# Patient Record
Sex: Female | Born: 1940
Health system: Southern US, Community
[De-identification: ages and names within clinical notes are randomized; demographics above are authoritative.]

## PROBLEM LIST (undated history)

## (undated) DIAGNOSIS — R519 Headache, unspecified: Secondary | ICD-10-CM

## (undated) DIAGNOSIS — Z889 Allergy status to unspecified drugs, medicaments and biological substances status: Secondary | ICD-10-CM

## (undated) DIAGNOSIS — T8859XA Other complications of anesthesia, initial encounter: Secondary | ICD-10-CM

## (undated) DIAGNOSIS — C801 Malignant (primary) neoplasm, unspecified: Secondary | ICD-10-CM

## (undated) DIAGNOSIS — R51 Headache: Secondary | ICD-10-CM

## (undated) DIAGNOSIS — T4145XA Adverse effect of unspecified anesthetic, initial encounter: Secondary | ICD-10-CM

## (undated) DIAGNOSIS — I639 Cerebral infarction, unspecified: Secondary | ICD-10-CM

## (undated) DIAGNOSIS — Z9889 Other specified postprocedural states: Secondary | ICD-10-CM

## (undated) DIAGNOSIS — M1990 Unspecified osteoarthritis, unspecified site: Secondary | ICD-10-CM

## (undated) DIAGNOSIS — K219 Gastro-esophageal reflux disease without esophagitis: Secondary | ICD-10-CM

## (undated) DIAGNOSIS — R112 Nausea with vomiting, unspecified: Secondary | ICD-10-CM

## (undated) DIAGNOSIS — I10 Essential (primary) hypertension: Secondary | ICD-10-CM

## (undated) HISTORY — PX: ABDOMINAL HYSTERECTOMY: SHX81

## (undated) HISTORY — PX: BREAST SURGERY: SHX581

## (undated) HISTORY — PX: TUBAL LIGATION: SHX77

## (undated) HISTORY — PX: CARPOMETACARPAL (CMC) FUSION OF THUMB: SHX6290

## (undated) HISTORY — PX: ABLATION: SHX5711

## (undated) HISTORY — PX: BACK SURGERY: SHX140

## (undated) HISTORY — PX: APPENDECTOMY: SHX54

## (undated) HISTORY — PX: HAND SURGERY: SHX662

---

## 2005-01-26 ENCOUNTER — Encounter: Admission: RE | Admit: 2005-01-26 | Discharge: 2005-01-26 | Payer: Self-pay | Admitting: Family Medicine

## 2005-04-20 ENCOUNTER — Encounter: Admission: RE | Admit: 2005-04-20 | Discharge: 2005-04-20 | Payer: Self-pay | Admitting: Family Medicine

## 2011-10-11 DIAGNOSIS — Z1231 Encounter for screening mammogram for malignant neoplasm of breast: Secondary | ICD-10-CM | POA: Diagnosis not present

## 2011-10-14 DIAGNOSIS — J069 Acute upper respiratory infection, unspecified: Secondary | ICD-10-CM | POA: Diagnosis not present

## 2011-11-14 DIAGNOSIS — M19049 Primary osteoarthritis, unspecified hand: Secondary | ICD-10-CM | POA: Diagnosis not present

## 2011-12-06 DIAGNOSIS — Z0000 Encounter for general adult medical examination without abnormal findings: Secondary | ICD-10-CM | POA: Diagnosis not present

## 2011-12-06 DIAGNOSIS — E785 Hyperlipidemia, unspecified: Secondary | ICD-10-CM | POA: Diagnosis not present

## 2011-12-06 DIAGNOSIS — E559 Vitamin D deficiency, unspecified: Secondary | ICD-10-CM | POA: Diagnosis not present

## 2011-12-06 DIAGNOSIS — Z1331 Encounter for screening for depression: Secondary | ICD-10-CM | POA: Diagnosis not present

## 2011-12-06 DIAGNOSIS — I10 Essential (primary) hypertension: Secondary | ICD-10-CM | POA: Diagnosis not present

## 2011-12-06 DIAGNOSIS — K219 Gastro-esophageal reflux disease without esophagitis: Secondary | ICD-10-CM | POA: Diagnosis not present

## 2011-12-18 DIAGNOSIS — J069 Acute upper respiratory infection, unspecified: Secondary | ICD-10-CM | POA: Diagnosis not present

## 2011-12-31 DIAGNOSIS — L570 Actinic keratosis: Secondary | ICD-10-CM | POA: Diagnosis not present

## 2011-12-31 DIAGNOSIS — D239 Other benign neoplasm of skin, unspecified: Secondary | ICD-10-CM | POA: Diagnosis not present

## 2011-12-31 DIAGNOSIS — Z85828 Personal history of other malignant neoplasm of skin: Secondary | ICD-10-CM | POA: Diagnosis not present

## 2011-12-31 DIAGNOSIS — D485 Neoplasm of uncertain behavior of skin: Secondary | ICD-10-CM | POA: Diagnosis not present

## 2011-12-31 DIAGNOSIS — L821 Other seborrheic keratosis: Secondary | ICD-10-CM | POA: Diagnosis not present

## 2012-02-14 DIAGNOSIS — M1710 Unilateral primary osteoarthritis, unspecified knee: Secondary | ICD-10-CM | POA: Diagnosis not present

## 2012-02-20 DIAGNOSIS — M1710 Unilateral primary osteoarthritis, unspecified knee: Secondary | ICD-10-CM | POA: Diagnosis not present

## 2012-02-27 DIAGNOSIS — M1710 Unilateral primary osteoarthritis, unspecified knee: Secondary | ICD-10-CM | POA: Diagnosis not present

## 2012-03-03 DIAGNOSIS — H0019 Chalazion unspecified eye, unspecified eyelid: Secondary | ICD-10-CM | POA: Diagnosis not present

## 2012-03-05 DIAGNOSIS — M1710 Unilateral primary osteoarthritis, unspecified knee: Secondary | ICD-10-CM | POA: Diagnosis not present

## 2012-03-13 DIAGNOSIS — M1710 Unilateral primary osteoarthritis, unspecified knee: Secondary | ICD-10-CM | POA: Diagnosis not present

## 2012-03-17 DIAGNOSIS — M19049 Primary osteoarthritis, unspecified hand: Secondary | ICD-10-CM | POA: Diagnosis not present

## 2012-03-24 DIAGNOSIS — M1710 Unilateral primary osteoarthritis, unspecified knee: Secondary | ICD-10-CM | POA: Diagnosis not present

## 2012-05-08 DIAGNOSIS — M1710 Unilateral primary osteoarthritis, unspecified knee: Secondary | ICD-10-CM | POA: Diagnosis not present

## 2012-05-26 DIAGNOSIS — R42 Dizziness and giddiness: Secondary | ICD-10-CM | POA: Diagnosis not present

## 2012-05-26 DIAGNOSIS — Z23 Encounter for immunization: Secondary | ICD-10-CM | POA: Diagnosis not present

## 2012-05-27 DIAGNOSIS — H109 Unspecified conjunctivitis: Secondary | ICD-10-CM | POA: Diagnosis not present

## 2012-06-12 DIAGNOSIS — N3000 Acute cystitis without hematuria: Secondary | ICD-10-CM | POA: Diagnosis not present

## 2012-06-12 DIAGNOSIS — N390 Urinary tract infection, site not specified: Secondary | ICD-10-CM | POA: Diagnosis not present

## 2012-06-12 DIAGNOSIS — R300 Dysuria: Secondary | ICD-10-CM | POA: Diagnosis not present

## 2012-06-13 DIAGNOSIS — R229 Localized swelling, mass and lump, unspecified: Secondary | ICD-10-CM | POA: Diagnosis not present

## 2012-09-19 DIAGNOSIS — H6120 Impacted cerumen, unspecified ear: Secondary | ICD-10-CM | POA: Diagnosis not present

## 2012-10-01 DIAGNOSIS — M1710 Unilateral primary osteoarthritis, unspecified knee: Secondary | ICD-10-CM | POA: Diagnosis not present

## 2012-10-08 DIAGNOSIS — M1710 Unilateral primary osteoarthritis, unspecified knee: Secondary | ICD-10-CM | POA: Diagnosis not present

## 2012-10-14 DIAGNOSIS — Z1231 Encounter for screening mammogram for malignant neoplasm of breast: Secondary | ICD-10-CM | POA: Diagnosis not present

## 2012-10-15 DIAGNOSIS — M1710 Unilateral primary osteoarthritis, unspecified knee: Secondary | ICD-10-CM | POA: Diagnosis not present

## 2012-10-22 DIAGNOSIS — M1710 Unilateral primary osteoarthritis, unspecified knee: Secondary | ICD-10-CM | POA: Diagnosis not present

## 2012-10-31 DIAGNOSIS — M1710 Unilateral primary osteoarthritis, unspecified knee: Secondary | ICD-10-CM | POA: Diagnosis not present

## 2012-12-12 DIAGNOSIS — E559 Vitamin D deficiency, unspecified: Secondary | ICD-10-CM | POA: Diagnosis not present

## 2012-12-12 DIAGNOSIS — Z0000 Encounter for general adult medical examination without abnormal findings: Secondary | ICD-10-CM | POA: Diagnosis not present

## 2012-12-12 DIAGNOSIS — E538 Deficiency of other specified B group vitamins: Secondary | ICD-10-CM | POA: Diagnosis not present

## 2012-12-12 DIAGNOSIS — E785 Hyperlipidemia, unspecified: Secondary | ICD-10-CM | POA: Diagnosis not present

## 2012-12-12 DIAGNOSIS — K219 Gastro-esophageal reflux disease without esophagitis: Secondary | ICD-10-CM | POA: Diagnosis not present

## 2012-12-12 DIAGNOSIS — I10 Essential (primary) hypertension: Secondary | ICD-10-CM | POA: Diagnosis not present

## 2012-12-12 DIAGNOSIS — R42 Dizziness and giddiness: Secondary | ICD-10-CM | POA: Diagnosis not present

## 2012-12-18 DIAGNOSIS — R42 Dizziness and giddiness: Secondary | ICD-10-CM | POA: Diagnosis not present

## 2012-12-18 DIAGNOSIS — H8110 Benign paroxysmal vertigo, unspecified ear: Secondary | ICD-10-CM | POA: Diagnosis not present

## 2012-12-18 DIAGNOSIS — H6120 Impacted cerumen, unspecified ear: Secondary | ICD-10-CM | POA: Diagnosis not present

## 2012-12-23 ENCOUNTER — Other Ambulatory Visit: Payer: Self-pay | Admitting: Family Medicine

## 2012-12-23 DIAGNOSIS — R42 Dizziness and giddiness: Secondary | ICD-10-CM

## 2012-12-31 ENCOUNTER — Ambulatory Visit
Admission: RE | Admit: 2012-12-31 | Discharge: 2012-12-31 | Disposition: A | Discharge status: Home / Self Care | Payer: Medicare Other | Source: Ambulatory Visit | Attending: Family Medicine | Admitting: Family Medicine

## 2012-12-31 DIAGNOSIS — I658 Occlusion and stenosis of other precerebral arteries: Secondary | ICD-10-CM | POA: Diagnosis not present

## 2012-12-31 DIAGNOSIS — R42 Dizziness and giddiness: Secondary | ICD-10-CM

## 2013-01-06 DIAGNOSIS — I10 Essential (primary) hypertension: Secondary | ICD-10-CM | POA: Diagnosis not present

## 2013-01-06 DIAGNOSIS — I951 Orthostatic hypotension: Secondary | ICD-10-CM | POA: Diagnosis not present

## 2013-01-16 DIAGNOSIS — R42 Dizziness and giddiness: Secondary | ICD-10-CM | POA: Diagnosis not present

## 2013-01-16 DIAGNOSIS — I10 Essential (primary) hypertension: Secondary | ICD-10-CM | POA: Diagnosis not present

## 2013-01-19 ENCOUNTER — Other Ambulatory Visit: Payer: Self-pay | Admitting: Family Medicine

## 2013-01-19 DIAGNOSIS — R42 Dizziness and giddiness: Secondary | ICD-10-CM

## 2013-01-21 ENCOUNTER — Ambulatory Visit
Admission: RE | Admit: 2013-01-21 | Discharge: 2013-01-21 | Disposition: A | Discharge status: Home / Self Care | Payer: Medicare Other | Source: Ambulatory Visit | Attending: Family Medicine | Admitting: Family Medicine

## 2013-01-21 DIAGNOSIS — R42 Dizziness and giddiness: Secondary | ICD-10-CM | POA: Diagnosis not present

## 2013-02-04 ENCOUNTER — Ambulatory Visit: Payer: Medicare Other | Attending: Family Medicine | Admitting: *Deleted

## 2013-02-04 DIAGNOSIS — IMO0001 Reserved for inherently not codable concepts without codable children: Secondary | ICD-10-CM | POA: Diagnosis not present

## 2013-02-04 DIAGNOSIS — H8110 Benign paroxysmal vertigo, unspecified ear: Secondary | ICD-10-CM | POA: Insufficient documentation

## 2013-02-06 ENCOUNTER — Ambulatory Visit: Payer: Medicare Other | Admitting: *Deleted

## 2013-02-06 DIAGNOSIS — H8110 Benign paroxysmal vertigo, unspecified ear: Secondary | ICD-10-CM | POA: Diagnosis not present

## 2013-02-06 DIAGNOSIS — IMO0001 Reserved for inherently not codable concepts without codable children: Secondary | ICD-10-CM | POA: Diagnosis not present

## 2013-02-18 DIAGNOSIS — H1044 Vernal conjunctivitis: Secondary | ICD-10-CM | POA: Diagnosis not present

## 2013-04-13 DIAGNOSIS — Z85828 Personal history of other malignant neoplasm of skin: Secondary | ICD-10-CM | POA: Diagnosis not present

## 2013-04-13 DIAGNOSIS — D485 Neoplasm of uncertain behavior of skin: Secondary | ICD-10-CM | POA: Diagnosis not present

## 2013-04-13 DIAGNOSIS — Z411 Encounter for cosmetic surgery: Secondary | ICD-10-CM | POA: Diagnosis not present

## 2013-04-13 DIAGNOSIS — L821 Other seborrheic keratosis: Secondary | ICD-10-CM | POA: Diagnosis not present

## 2013-04-13 DIAGNOSIS — L570 Actinic keratosis: Secondary | ICD-10-CM | POA: Diagnosis not present

## 2013-04-13 DIAGNOSIS — D239 Other benign neoplasm of skin, unspecified: Secondary | ICD-10-CM | POA: Diagnosis not present

## 2013-05-06 DIAGNOSIS — M1710 Unilateral primary osteoarthritis, unspecified knee: Secondary | ICD-10-CM | POA: Diagnosis not present

## 2013-05-13 DIAGNOSIS — M1710 Unilateral primary osteoarthritis, unspecified knee: Secondary | ICD-10-CM | POA: Diagnosis not present

## 2013-05-22 DIAGNOSIS — Z23 Encounter for immunization: Secondary | ICD-10-CM | POA: Diagnosis not present

## 2013-05-22 DIAGNOSIS — M1710 Unilateral primary osteoarthritis, unspecified knee: Secondary | ICD-10-CM | POA: Diagnosis not present

## 2013-05-29 DIAGNOSIS — M1710 Unilateral primary osteoarthritis, unspecified knee: Secondary | ICD-10-CM | POA: Diagnosis not present

## 2013-06-03 DIAGNOSIS — J40 Bronchitis, not specified as acute or chronic: Secondary | ICD-10-CM | POA: Diagnosis not present

## 2013-06-05 DIAGNOSIS — H0019 Chalazion unspecified eye, unspecified eyelid: Secondary | ICD-10-CM | POA: Diagnosis not present

## 2013-06-05 DIAGNOSIS — M1710 Unilateral primary osteoarthritis, unspecified knee: Secondary | ICD-10-CM | POA: Diagnosis not present

## 2013-06-17 DIAGNOSIS — M1990 Unspecified osteoarthritis, unspecified site: Secondary | ICD-10-CM | POA: Diagnosis not present

## 2013-07-01 DIAGNOSIS — M1990 Unspecified osteoarthritis, unspecified site: Secondary | ICD-10-CM | POA: Diagnosis not present

## 2013-09-14 DIAGNOSIS — M1990 Unspecified osteoarthritis, unspecified site: Secondary | ICD-10-CM | POA: Diagnosis not present

## 2013-10-13 DIAGNOSIS — I10 Essential (primary) hypertension: Secondary | ICD-10-CM | POA: Diagnosis not present

## 2013-10-15 DIAGNOSIS — Z1231 Encounter for screening mammogram for malignant neoplasm of breast: Secondary | ICD-10-CM | POA: Diagnosis not present

## 2013-10-20 DIAGNOSIS — I10 Essential (primary) hypertension: Secondary | ICD-10-CM | POA: Diagnosis not present

## 2013-11-11 DIAGNOSIS — J069 Acute upper respiratory infection, unspecified: Secondary | ICD-10-CM | POA: Diagnosis not present

## 2013-11-11 DIAGNOSIS — I10 Essential (primary) hypertension: Secondary | ICD-10-CM | POA: Diagnosis not present

## 2013-11-16 DIAGNOSIS — L570 Actinic keratosis: Secondary | ICD-10-CM | POA: Diagnosis not present

## 2013-12-09 DIAGNOSIS — M1710 Unilateral primary osteoarthritis, unspecified knee: Secondary | ICD-10-CM | POA: Diagnosis not present

## 2013-12-09 DIAGNOSIS — IMO0002 Reserved for concepts with insufficient information to code with codable children: Secondary | ICD-10-CM | POA: Diagnosis not present

## 2013-12-16 DIAGNOSIS — M1710 Unilateral primary osteoarthritis, unspecified knee: Secondary | ICD-10-CM | POA: Diagnosis not present

## 2013-12-17 DIAGNOSIS — K219 Gastro-esophageal reflux disease without esophagitis: Secondary | ICD-10-CM | POA: Diagnosis not present

## 2013-12-17 DIAGNOSIS — Z23 Encounter for immunization: Secondary | ICD-10-CM | POA: Diagnosis not present

## 2013-12-17 DIAGNOSIS — Z0000 Encounter for general adult medical examination without abnormal findings: Secondary | ICD-10-CM | POA: Diagnosis not present

## 2013-12-17 DIAGNOSIS — E7800 Pure hypercholesterolemia, unspecified: Secondary | ICD-10-CM | POA: Diagnosis not present

## 2013-12-17 DIAGNOSIS — E538 Deficiency of other specified B group vitamins: Secondary | ICD-10-CM | POA: Diagnosis not present

## 2013-12-17 DIAGNOSIS — I10 Essential (primary) hypertension: Secondary | ICD-10-CM | POA: Diagnosis not present

## 2013-12-24 DIAGNOSIS — M1710 Unilateral primary osteoarthritis, unspecified knee: Secondary | ICD-10-CM | POA: Diagnosis not present

## 2014-01-01 DIAGNOSIS — M1710 Unilateral primary osteoarthritis, unspecified knee: Secondary | ICD-10-CM | POA: Diagnosis not present

## 2014-01-08 DIAGNOSIS — M1710 Unilateral primary osteoarthritis, unspecified knee: Secondary | ICD-10-CM | POA: Diagnosis not present

## 2014-02-03 DIAGNOSIS — S91109A Unspecified open wound of unspecified toe(s) without damage to nail, initial encounter: Secondary | ICD-10-CM | POA: Diagnosis not present

## 2014-02-16 DIAGNOSIS — M76899 Other specified enthesopathies of unspecified lower limb, excluding foot: Secondary | ICD-10-CM | POA: Diagnosis not present

## 2014-02-24 DIAGNOSIS — D485 Neoplasm of uncertain behavior of skin: Secondary | ICD-10-CM | POA: Diagnosis not present

## 2014-02-24 DIAGNOSIS — L570 Actinic keratosis: Secondary | ICD-10-CM | POA: Diagnosis not present

## 2014-04-19 DIAGNOSIS — L821 Other seborrheic keratosis: Secondary | ICD-10-CM | POA: Diagnosis not present

## 2014-04-19 DIAGNOSIS — D239 Other benign neoplasm of skin, unspecified: Secondary | ICD-10-CM | POA: Diagnosis not present

## 2014-04-19 DIAGNOSIS — Z85828 Personal history of other malignant neoplasm of skin: Secondary | ICD-10-CM | POA: Diagnosis not present

## 2014-04-19 DIAGNOSIS — L570 Actinic keratosis: Secondary | ICD-10-CM | POA: Diagnosis not present

## 2014-06-07 DIAGNOSIS — H2510 Age-related nuclear cataract, unspecified eye: Secondary | ICD-10-CM | POA: Diagnosis not present

## 2014-06-07 DIAGNOSIS — Z23 Encounter for immunization: Secondary | ICD-10-CM | POA: Diagnosis not present

## 2014-06-17 DIAGNOSIS — M1990 Unspecified osteoarthritis, unspecified site: Secondary | ICD-10-CM | POA: Diagnosis not present

## 2014-06-17 DIAGNOSIS — M25511 Pain in right shoulder: Secondary | ICD-10-CM | POA: Diagnosis not present

## 2014-06-17 DIAGNOSIS — M7541 Impingement syndrome of right shoulder: Secondary | ICD-10-CM | POA: Diagnosis not present

## 2014-06-22 DIAGNOSIS — K219 Gastro-esophageal reflux disease without esophagitis: Secondary | ICD-10-CM | POA: Diagnosis not present

## 2014-06-22 DIAGNOSIS — I10 Essential (primary) hypertension: Secondary | ICD-10-CM | POA: Diagnosis not present

## 2014-06-22 DIAGNOSIS — E785 Hyperlipidemia, unspecified: Secondary | ICD-10-CM | POA: Diagnosis not present

## 2014-06-22 DIAGNOSIS — E538 Deficiency of other specified B group vitamins: Secondary | ICD-10-CM | POA: Diagnosis not present

## 2014-06-25 DIAGNOSIS — B0089 Other herpesviral infection: Secondary | ICD-10-CM | POA: Diagnosis not present

## 2014-07-22 DIAGNOSIS — L570 Actinic keratosis: Secondary | ICD-10-CM | POA: Diagnosis not present

## 2014-07-22 DIAGNOSIS — L821 Other seborrheic keratosis: Secondary | ICD-10-CM | POA: Diagnosis not present

## 2014-08-04 DIAGNOSIS — M1711 Unilateral primary osteoarthritis, right knee: Secondary | ICD-10-CM | POA: Diagnosis not present

## 2014-08-04 DIAGNOSIS — M170 Bilateral primary osteoarthritis of knee: Secondary | ICD-10-CM | POA: Diagnosis not present

## 2014-08-04 DIAGNOSIS — M1712 Unilateral primary osteoarthritis, left knee: Secondary | ICD-10-CM | POA: Diagnosis not present

## 2014-08-11 DIAGNOSIS — M1712 Unilateral primary osteoarthritis, left knee: Secondary | ICD-10-CM | POA: Diagnosis not present

## 2014-08-11 DIAGNOSIS — M1711 Unilateral primary osteoarthritis, right knee: Secondary | ICD-10-CM | POA: Diagnosis not present

## 2014-08-20 DIAGNOSIS — M1711 Unilateral primary osteoarthritis, right knee: Secondary | ICD-10-CM | POA: Diagnosis not present

## 2014-08-20 DIAGNOSIS — M170 Bilateral primary osteoarthritis of knee: Secondary | ICD-10-CM | POA: Diagnosis not present

## 2014-08-20 DIAGNOSIS — M1712 Unilateral primary osteoarthritis, left knee: Secondary | ICD-10-CM | POA: Diagnosis not present

## 2014-08-27 DIAGNOSIS — M170 Bilateral primary osteoarthritis of knee: Secondary | ICD-10-CM | POA: Diagnosis not present

## 2014-08-27 DIAGNOSIS — M1711 Unilateral primary osteoarthritis, right knee: Secondary | ICD-10-CM | POA: Diagnosis not present

## 2014-08-27 DIAGNOSIS — M1712 Unilateral primary osteoarthritis, left knee: Secondary | ICD-10-CM | POA: Diagnosis not present

## 2014-09-08 DIAGNOSIS — M1712 Unilateral primary osteoarthritis, left knee: Secondary | ICD-10-CM | POA: Diagnosis not present

## 2014-09-08 DIAGNOSIS — M1711 Unilateral primary osteoarthritis, right knee: Secondary | ICD-10-CM | POA: Diagnosis not present

## 2014-09-08 DIAGNOSIS — M170 Bilateral primary osteoarthritis of knee: Secondary | ICD-10-CM | POA: Diagnosis not present

## 2014-10-22 DIAGNOSIS — Z1231 Encounter for screening mammogram for malignant neoplasm of breast: Secondary | ICD-10-CM | POA: Diagnosis not present

## 2014-12-23 DIAGNOSIS — E780 Pure hypercholesterolemia: Secondary | ICD-10-CM | POA: Diagnosis not present

## 2014-12-23 DIAGNOSIS — D81818 Other biotin-dependent carboxylase deficiency: Secondary | ICD-10-CM | POA: Diagnosis not present

## 2014-12-23 DIAGNOSIS — Z0000 Encounter for general adult medical examination without abnormal findings: Secondary | ICD-10-CM | POA: Diagnosis not present

## 2014-12-23 DIAGNOSIS — Z79899 Other long term (current) drug therapy: Secondary | ICD-10-CM | POA: Diagnosis not present

## 2014-12-23 DIAGNOSIS — K219 Gastro-esophageal reflux disease without esophagitis: Secondary | ICD-10-CM | POA: Diagnosis not present

## 2014-12-23 DIAGNOSIS — J309 Allergic rhinitis, unspecified: Secondary | ICD-10-CM | POA: Diagnosis not present

## 2014-12-23 DIAGNOSIS — I10 Essential (primary) hypertension: Secondary | ICD-10-CM | POA: Diagnosis not present

## 2014-12-24 DIAGNOSIS — M7541 Impingement syndrome of right shoulder: Secondary | ICD-10-CM | POA: Diagnosis not present

## 2014-12-24 DIAGNOSIS — M25511 Pain in right shoulder: Secondary | ICD-10-CM | POA: Diagnosis not present

## 2014-12-24 DIAGNOSIS — M19011 Primary osteoarthritis, right shoulder: Secondary | ICD-10-CM | POA: Diagnosis not present

## 2015-03-15 DIAGNOSIS — I10 Essential (primary) hypertension: Secondary | ICD-10-CM | POA: Diagnosis not present

## 2015-03-15 DIAGNOSIS — R609 Edema, unspecified: Secondary | ICD-10-CM | POA: Diagnosis not present

## 2015-03-15 DIAGNOSIS — K219 Gastro-esophageal reflux disease without esophagitis: Secondary | ICD-10-CM | POA: Diagnosis not present

## 2015-03-23 DIAGNOSIS — M170 Bilateral primary osteoarthritis of knee: Secondary | ICD-10-CM | POA: Diagnosis not present

## 2015-03-23 DIAGNOSIS — M1712 Unilateral primary osteoarthritis, left knee: Secondary | ICD-10-CM | POA: Diagnosis not present

## 2015-03-23 DIAGNOSIS — M1711 Unilateral primary osteoarthritis, right knee: Secondary | ICD-10-CM | POA: Diagnosis not present

## 2015-03-30 DIAGNOSIS — M1712 Unilateral primary osteoarthritis, left knee: Secondary | ICD-10-CM | POA: Diagnosis not present

## 2015-03-30 DIAGNOSIS — M1711 Unilateral primary osteoarthritis, right knee: Secondary | ICD-10-CM | POA: Diagnosis not present

## 2015-03-30 DIAGNOSIS — M170 Bilateral primary osteoarthritis of knee: Secondary | ICD-10-CM | POA: Diagnosis not present

## 2015-04-06 DIAGNOSIS — M170 Bilateral primary osteoarthritis of knee: Secondary | ICD-10-CM | POA: Diagnosis not present

## 2015-04-06 DIAGNOSIS — M1712 Unilateral primary osteoarthritis, left knee: Secondary | ICD-10-CM | POA: Diagnosis not present

## 2015-04-06 DIAGNOSIS — M1711 Unilateral primary osteoarthritis, right knee: Secondary | ICD-10-CM | POA: Diagnosis not present

## 2015-04-13 DIAGNOSIS — M170 Bilateral primary osteoarthritis of knee: Secondary | ICD-10-CM | POA: Diagnosis not present

## 2015-04-20 DIAGNOSIS — M1711 Unilateral primary osteoarthritis, right knee: Secondary | ICD-10-CM | POA: Diagnosis not present

## 2015-04-20 DIAGNOSIS — M170 Bilateral primary osteoarthritis of knee: Secondary | ICD-10-CM | POA: Diagnosis not present

## 2015-04-20 DIAGNOSIS — M1712 Unilateral primary osteoarthritis, left knee: Secondary | ICD-10-CM | POA: Diagnosis not present

## 2015-05-05 DIAGNOSIS — Z86018 Personal history of other benign neoplasm: Secondary | ICD-10-CM | POA: Diagnosis not present

## 2015-05-05 DIAGNOSIS — Z85828 Personal history of other malignant neoplasm of skin: Secondary | ICD-10-CM | POA: Diagnosis not present

## 2015-05-05 DIAGNOSIS — L570 Actinic keratosis: Secondary | ICD-10-CM | POA: Diagnosis not present

## 2015-05-05 DIAGNOSIS — L821 Other seborrheic keratosis: Secondary | ICD-10-CM | POA: Diagnosis not present

## 2015-05-30 DIAGNOSIS — Z23 Encounter for immunization: Secondary | ICD-10-CM | POA: Diagnosis not present

## 2015-06-27 DIAGNOSIS — H2513 Age-related nuclear cataract, bilateral: Secondary | ICD-10-CM | POA: Diagnosis not present

## 2015-07-22 DIAGNOSIS — M25511 Pain in right shoulder: Secondary | ICD-10-CM | POA: Diagnosis not present

## 2015-08-15 DIAGNOSIS — J029 Acute pharyngitis, unspecified: Secondary | ICD-10-CM | POA: Diagnosis not present

## 2015-08-29 DIAGNOSIS — J329 Chronic sinusitis, unspecified: Secondary | ICD-10-CM | POA: Diagnosis not present

## 2015-08-29 DIAGNOSIS — R05 Cough: Secondary | ICD-10-CM | POA: Diagnosis not present

## 2015-09-06 DIAGNOSIS — I10 Essential (primary) hypertension: Secondary | ICD-10-CM | POA: Diagnosis not present

## 2015-09-06 DIAGNOSIS — Z79899 Other long term (current) drug therapy: Secondary | ICD-10-CM | POA: Diagnosis not present

## 2015-10-05 DIAGNOSIS — M170 Bilateral primary osteoarthritis of knee: Secondary | ICD-10-CM | POA: Diagnosis not present

## 2015-10-05 DIAGNOSIS — M722 Plantar fascial fibromatosis: Secondary | ICD-10-CM | POA: Diagnosis not present

## 2015-10-05 DIAGNOSIS — M1711 Unilateral primary osteoarthritis, right knee: Secondary | ICD-10-CM | POA: Diagnosis not present

## 2015-10-05 DIAGNOSIS — M1712 Unilateral primary osteoarthritis, left knee: Secondary | ICD-10-CM | POA: Diagnosis not present

## 2015-10-24 DIAGNOSIS — M1711 Unilateral primary osteoarthritis, right knee: Secondary | ICD-10-CM | POA: Diagnosis not present

## 2015-10-24 DIAGNOSIS — M1712 Unilateral primary osteoarthritis, left knee: Secondary | ICD-10-CM | POA: Diagnosis not present

## 2015-10-24 DIAGNOSIS — M170 Bilateral primary osteoarthritis of knee: Secondary | ICD-10-CM | POA: Diagnosis not present

## 2015-11-02 DIAGNOSIS — M1711 Unilateral primary osteoarthritis, right knee: Secondary | ICD-10-CM | POA: Diagnosis not present

## 2015-11-02 DIAGNOSIS — M170 Bilateral primary osteoarthritis of knee: Secondary | ICD-10-CM | POA: Diagnosis not present

## 2015-11-02 DIAGNOSIS — M1712 Unilateral primary osteoarthritis, left knee: Secondary | ICD-10-CM | POA: Diagnosis not present

## 2015-11-09 DIAGNOSIS — M1711 Unilateral primary osteoarthritis, right knee: Secondary | ICD-10-CM | POA: Diagnosis not present

## 2015-11-09 DIAGNOSIS — M1712 Unilateral primary osteoarthritis, left knee: Secondary | ICD-10-CM | POA: Diagnosis not present

## 2015-11-09 DIAGNOSIS — M170 Bilateral primary osteoarthritis of knee: Secondary | ICD-10-CM | POA: Diagnosis not present

## 2015-11-16 DIAGNOSIS — M170 Bilateral primary osteoarthritis of knee: Secondary | ICD-10-CM | POA: Diagnosis not present

## 2015-11-23 DIAGNOSIS — M1712 Unilateral primary osteoarthritis, left knee: Secondary | ICD-10-CM | POA: Diagnosis not present

## 2015-11-23 DIAGNOSIS — M1711 Unilateral primary osteoarthritis, right knee: Secondary | ICD-10-CM | POA: Diagnosis not present

## 2015-11-23 DIAGNOSIS — M170 Bilateral primary osteoarthritis of knee: Secondary | ICD-10-CM | POA: Diagnosis not present

## 2015-12-12 DIAGNOSIS — N390 Urinary tract infection, site not specified: Secondary | ICD-10-CM | POA: Diagnosis not present

## 2015-12-26 DIAGNOSIS — E7800 Pure hypercholesterolemia, unspecified: Secondary | ICD-10-CM | POA: Diagnosis not present

## 2015-12-26 DIAGNOSIS — I10 Essential (primary) hypertension: Secondary | ICD-10-CM | POA: Diagnosis not present

## 2015-12-26 DIAGNOSIS — Z79899 Other long term (current) drug therapy: Secondary | ICD-10-CM | POA: Diagnosis not present

## 2015-12-26 DIAGNOSIS — Z0000 Encounter for general adult medical examination without abnormal findings: Secondary | ICD-10-CM | POA: Diagnosis not present

## 2015-12-26 DIAGNOSIS — K219 Gastro-esophageal reflux disease without esophagitis: Secondary | ICD-10-CM | POA: Diagnosis not present

## 2015-12-26 DIAGNOSIS — D81818 Other biotin-dependent carboxylase deficiency: Secondary | ICD-10-CM | POA: Diagnosis not present

## 2016-01-03 DIAGNOSIS — Z780 Asymptomatic menopausal state: Secondary | ICD-10-CM | POA: Diagnosis not present

## 2016-01-03 DIAGNOSIS — Z1231 Encounter for screening mammogram for malignant neoplasm of breast: Secondary | ICD-10-CM | POA: Diagnosis not present

## 2016-01-13 DIAGNOSIS — E559 Vitamin D deficiency, unspecified: Secondary | ICD-10-CM | POA: Diagnosis not present

## 2016-01-20 DIAGNOSIS — M19011 Primary osteoarthritis, right shoulder: Secondary | ICD-10-CM | POA: Diagnosis not present

## 2016-01-20 DIAGNOSIS — M25511 Pain in right shoulder: Secondary | ICD-10-CM | POA: Diagnosis not present

## 2016-04-24 DIAGNOSIS — M170 Bilateral primary osteoarthritis of knee: Secondary | ICD-10-CM | POA: Diagnosis not present

## 2016-04-24 DIAGNOSIS — M1711 Unilateral primary osteoarthritis, right knee: Secondary | ICD-10-CM | POA: Diagnosis not present

## 2016-05-09 DIAGNOSIS — Z86018 Personal history of other benign neoplasm: Secondary | ICD-10-CM | POA: Diagnosis not present

## 2016-05-09 DIAGNOSIS — Z85828 Personal history of other malignant neoplasm of skin: Secondary | ICD-10-CM | POA: Diagnosis not present

## 2016-05-09 DIAGNOSIS — L821 Other seborrheic keratosis: Secondary | ICD-10-CM | POA: Diagnosis not present

## 2016-05-09 DIAGNOSIS — D485 Neoplasm of uncertain behavior of skin: Secondary | ICD-10-CM | POA: Diagnosis not present

## 2016-05-09 DIAGNOSIS — L570 Actinic keratosis: Secondary | ICD-10-CM | POA: Diagnosis not present

## 2016-05-10 DIAGNOSIS — N390 Urinary tract infection, site not specified: Secondary | ICD-10-CM | POA: Diagnosis not present

## 2016-05-28 DIAGNOSIS — M170 Bilateral primary osteoarthritis of knee: Secondary | ICD-10-CM | POA: Diagnosis not present

## 2016-06-04 DIAGNOSIS — Z23 Encounter for immunization: Secondary | ICD-10-CM | POA: Diagnosis not present

## 2016-06-04 DIAGNOSIS — M170 Bilateral primary osteoarthritis of knee: Secondary | ICD-10-CM | POA: Diagnosis not present

## 2016-06-07 DIAGNOSIS — J069 Acute upper respiratory infection, unspecified: Secondary | ICD-10-CM | POA: Diagnosis not present

## 2016-06-07 DIAGNOSIS — J302 Other seasonal allergic rhinitis: Secondary | ICD-10-CM | POA: Diagnosis not present

## 2016-06-11 DIAGNOSIS — M170 Bilateral primary osteoarthritis of knee: Secondary | ICD-10-CM | POA: Diagnosis not present

## 2016-06-18 DIAGNOSIS — M170 Bilateral primary osteoarthritis of knee: Secondary | ICD-10-CM | POA: Diagnosis not present

## 2016-06-20 DIAGNOSIS — M25512 Pain in left shoulder: Secondary | ICD-10-CM | POA: Diagnosis not present

## 2016-06-20 DIAGNOSIS — M25511 Pain in right shoulder: Secondary | ICD-10-CM | POA: Diagnosis not present

## 2016-06-25 DIAGNOSIS — M170 Bilateral primary osteoarthritis of knee: Secondary | ICD-10-CM | POA: Diagnosis not present

## 2016-06-27 DIAGNOSIS — H10413 Chronic giant papillary conjunctivitis, bilateral: Secondary | ICD-10-CM | POA: Diagnosis not present

## 2016-08-08 DIAGNOSIS — M25562 Pain in left knee: Secondary | ICD-10-CM | POA: Diagnosis not present

## 2016-08-08 DIAGNOSIS — M25561 Pain in right knee: Secondary | ICD-10-CM | POA: Diagnosis not present

## 2016-08-08 DIAGNOSIS — M170 Bilateral primary osteoarthritis of knee: Secondary | ICD-10-CM | POA: Diagnosis not present

## 2016-08-08 DIAGNOSIS — G8929 Other chronic pain: Secondary | ICD-10-CM | POA: Diagnosis not present

## 2016-08-22 ENCOUNTER — Other Ambulatory Visit: Payer: Self-pay | Admitting: Family Medicine

## 2016-08-22 ENCOUNTER — Ambulatory Visit
Admission: RE | Admit: 2016-08-22 | Discharge: 2016-08-22 | Disposition: A | Discharge status: Home / Self Care | Payer: Medicare Other | Source: Ambulatory Visit | Attending: Family Medicine | Admitting: Family Medicine

## 2016-08-22 DIAGNOSIS — Z01818 Encounter for other preprocedural examination: Secondary | ICD-10-CM

## 2016-08-22 DIAGNOSIS — I10 Essential (primary) hypertension: Secondary | ICD-10-CM | POA: Diagnosis not present

## 2016-08-22 DIAGNOSIS — E7800 Pure hypercholesterolemia, unspecified: Secondary | ICD-10-CM | POA: Diagnosis not present

## 2016-08-22 DIAGNOSIS — Z79899 Other long term (current) drug therapy: Secondary | ICD-10-CM | POA: Diagnosis not present

## 2016-08-22 DIAGNOSIS — R918 Other nonspecific abnormal finding of lung field: Secondary | ICD-10-CM | POA: Diagnosis not present

## 2016-09-06 NOTE — H&P (Signed)
TOTAL KNEE ADMISSION H&P  Patient is being admitted for right total knee arthroplasty.  Subjective:  Chief Complaint:   Right knee primary OA / pain  HPI: Gwendolyn Morrow, 75 y.o. female, has a history of pain and functional disability in the right knee due to arthritis and has failed non-surgical conservative treatments for greater than 12 weeks to include NSAID's and/or analgesics, corticosteriod injections, viscosupplementation injections and activity modification.  Onset of symptoms was gradual, starting >10 years ago with gradually worsening course since that time. The patient noted no past surgery on the right knee(s).  Patient currently rates pain in the right knee(s) at 8 out of 10 with activity. Patient has night pain, worsening of pain with activity and weight bearing, pain that interferes with activities of daily living, pain with passive range of motion, crepitus and joint swelling.  Patient has evidence of periarticular osteophytes and joint space narrowing by imaging studies.  There is no active infection.   Risks, benefits and expectations were discussed with the patient.  Risks including but not limited to the risk of anesthesia, blood clots, nerve damage, blood vessel damage, failure of the prosthesis, infection and up to and including death.  Patient understand the risks, benefits and expectations and wishes to proceed with surgery.   PCP: Lujean Amel, MD  D/C Plans:      Home  Post-op Meds:       Rx given for ASA, Norco, Zofran,  Robaxin,  Iron and MiraLax  Tranexamic Acid:      To be given - IV   Decadron:      Is to be given  FYI:     ASA  Norco with Zofran    Past Medical History:  Diagnosis Date  . Arthritis   . Complication of anesthesia   . GERD (gastroesophageal reflux disease)   . H/O seasonal allergies   . Headache   . Hypertension   . PONV (postoperative nausea and vomiting)     Past Surgical History:  Procedure Laterality Date  . ABDOMINAL HYSTERECTOMY     . APPENDECTOMY     removed during C-section  . BREAST SURGERY     right breast biopsy- benign  . CESAREAN SECTION    . HAND SURGERY     bilateral thumb joints replaced  . TUBAL LIGATION      No prescriptions prior to admission.   Allergies  Allergen Reactions  . Penicillins Anaphylaxis    Has patient had a PCN reaction causing immediate rash, facial/tongue/throat swelling, SOB or lightheadedness with hypotension: Yes Has patient had a PCN reaction causing severe rash involving mucus membranes or skin necrosis: No Has patient had a PCN reaction that required hospitalization Yes Has patient had a PCN reaction occurring within the last 10 years: No If all of the above answers are "NO", then may proceed with Cephalosporin use.   . Excedrin Extra Strength [Asa-Apap-Caff Buffered] Hives  . Tyloxapol Hives     Social History  Substance Use Topics  . Smoking status: Never Smoker  . Smokeless tobacco: Never Used  . Alcohol use Yes     Comment: rarely       Review of Systems  Constitutional: Negative.   HENT: Negative.   Eyes: Negative.   Respiratory: Negative.   Cardiovascular: Negative.   Gastrointestinal: Positive for heartburn.  Genitourinary: Negative.   Musculoskeletal: Positive for joint pain.  Skin: Negative.   Neurological: Negative.   Endo/Heme/Allergies: Positive for environmental allergies.  Psychiatric/Behavioral: Negative.  Objective:  Physical Exam  Constitutional: She is oriented to person, place, and time. She appears well-developed.  HENT:  Head: Normocephalic.  Eyes: Pupils are equal, round, and reactive to light.  Neck: Neck supple. No JVD present. No tracheal deviation present. No thyromegaly present.  Cardiovascular: Normal rate, regular rhythm, normal heart sounds and intact distal pulses.   Respiratory: Effort normal and breath sounds normal. No respiratory distress. She has no wheezes.  GI: Soft. There is no tenderness. There is no  guarding.  Musculoskeletal:       Right knee: She exhibits decreased range of motion, swelling and bony tenderness. She exhibits no ecchymosis, no deformity, no laceration and no erythema. Tenderness found.  Lymphadenopathy:    She has no cervical adenopathy.  Neurological: She is alert and oriented to person, place, and time.  Skin: Skin is warm and dry.  Psychiatric: She has a normal mood and affect.      Imaging Review Plain radiographs demonstrate severe degenerative joint disease of the right knee(s).  The bone quality appears to be good for age and reported activity level.  Assessment/Plan:  End stage arthritis, right knee   The patient history, physical examination, clinical judgment of the provider and imaging studies are consistent with end stage degenerative joint disease of the right knee(s) and total knee arthroplasty is deemed medically necessary. The treatment options including medical management, injection therapy arthroscopy and arthroplasty were discussed at length. The risks and benefits of total knee arthroplasty were presented and reviewed. The risks due to aseptic loosening, infection, stiffness, patella tracking problems, thromboembolic complications and other imponderables were discussed. The patient acknowledged the explanation, agreed to proceed with the plan and consent was signed. Patient is being admitted for inpatient treatment for surgery, pain control, PT, OT, prophylactic antibiotics, VTE prophylaxis, progressive ambulation and ADL's and discharge planning. The patient is planning to be discharged home.     West Pugh Cozette Braggs   PA-C  09/18/2016, 1:55 PM

## 2016-09-14 NOTE — Patient Instructions (Signed)
Gwendolyn Morrow  09/14/2016   Your procedure is scheduled on: Monday 09/24/2016  Report to [facility] Main  Entrance take [place]  elevators to 3rd floor to  [place] at   [place] AM.  Call this number if you have problems the morning of surgery [phone]   Remember: ONLY 1 PERSON MAY GO WITH YOU TO SHORT STAY TO GET  READY MORNING OF [place].    Do not eat food or drink liquids :After Midnight.     Take these medicines the morning of surgery with A SIP OF WATER: Amlodipine, Omeprazole (Prilosec)                                 You may not have any metal on your body including hair pins and              piercings  Do not wear jewelry, make-up, lotions, powders or perfumes, deodorant             Do not wear nail polish.  Do not shave  48 hours prior to surgery.              Men may shave face and neck.   Do not bring valuables to the hospital. [place].  Contacts, dentures or bridgework may not be worn into surgery.  Leave suitcase in the car. After surgery it may be brought to your room.                  Please read over the following fact sheets you were given: _____________________________________________________________________             [facility] - Preparing for Surgery Before surgery, you can play an important role.  Because skin is not sterile, your skin needs to be as free of germs as possible.  You can reduce the number of germs on your skin by washing with CHG (chlorahexidine gluconate) soap before surgery.  CHG is an antiseptic cleaner which kills germs and bonds with the skin to continue killing germs even after washing. Please DO NOT use if you have an allergy to CHG or antibacterial soaps.  If your skin becomes reddened/irritated stop using the CHG and inform your nurse when you arrive at Short Stay. Do not shave (including legs and underarms) for at least 48 hours prior to the  first CHG shower.  You may shave your face/neck. Please follow these instructions carefully:  1.  Shower with CHG Soap the night before surgery and the  morning of Surgery.  2.  If you choose to wash your hair, wash your hair first as usual with your  normal  shampoo.  3.  After you shampoo, rinse your hair and body thoroughly to remove the  shampoo.                           4.  Use CHG as you would any other liquid soap.  You can apply chg directly  to the skin and wash                       Gently with a scrungie or clean  washcloth.  5.  Apply the CHG Soap to your body ONLY FROM THE NECK DOWN.   Do not use on face/ open                           Wound or open sores. Avoid contact with eyes, ears mouth and genitals (private parts).                       Wash face,  Genitals (private parts) with your normal soap.             6.  Wash thoroughly, paying special attention to the area where your surgery  will be performed.  7.  Thoroughly rinse your body with warm water from the neck down.  8.  DO NOT shower/wash with your normal soap after using and rinsing off  the CHG Soap.                9.  Pat yourself dry with a clean towel.            10.  Wear clean pajamas.            11.  Place clean sheets on your bed the night of your first shower and do not  sleep with pets. Day of Surgery : Do not apply any lotions/deodorants the morning of surgery.  Please wear clean clothes to the hospital/surgery center.  FAILURE TO FOLLOW THESE INSTRUCTIONS MAY RESULT IN THE CANCELLATION OF YOUR SURGERY PATIENT SIGNATURE_________________________________  NURSE SIGNATURE__________________________________  ________________________________________________________________________   Adam Phenix  An incentive spirometer is a tool that can help keep your lungs clear and active. This tool measures how well you are filling your lungs with each breath. Taking long deep breaths may help reverse or decrease  the chance of developing breathing (pulmonary) problems (especially infection) following:  A long period of time when you are unable to move or be active. BEFORE THE PROCEDURE   If the spirometer includes an indicator to show your best effort, your nurse or respiratory therapist will set it to a desired goal.  If possible, sit up straight or lean slightly forward. Try not to slouch.  Hold the incentive spirometer in an upright position. INSTRUCTIONS FOR USE  1. Sit on the edge of your bed if possible, or sit up as far as you can in bed or on a chair. 2. Hold the incentive spirometer in an upright position. 3. Breathe out normally. 4. Place the mouthpiece in your mouth and seal your lips tightly around it. 5. Breathe in slowly and as deeply as possible, raising the piston or the ball toward the top of the column. 6. Hold your breath for 3-5 seconds or for as long as possible. Allow the piston or ball to fall to the bottom of the column. 7. Remove the mouthpiece from your mouth and breathe out normally. 8. Rest for a few seconds and repeat Steps 1 through 7 at least 10 times every 1-2 hours when you are awake. Take your time and take a few normal breaths between deep breaths. 9. The spirometer may include an indicator to show your best effort. Use the indicator as a goal to work toward during each repetition. 10. After each set of 10 deep breaths, practice coughing to be sure your lungs are clear. If you have an incision (the cut made at the time of surgery), support your incision when coughing by  placing a pillow or rolled up towels firmly against it. Once you are able to get out of bed, walk around indoors and cough well. You may stop using the incentive spirometer when instructed by your caregiver.  RISKS AND COMPLICATIONS  Take your time so you do not get dizzy or light-headed.  If you are in pain, you may need to take or ask for pain medication before doing incentive spirometry. It is  harder to take a deep breath if you are having pain. AFTER USE  Rest and breathe slowly and easily.  It can be helpful to keep track of a log of your progress. Your caregiver can provide you with a simple table to help with this. If you are using the spirometer at home, follow these instructions: [place] IF:   You are having difficultly using the spirometer.  You have trouble using the spirometer as often as instructed.  Your pain medication is not giving enough relief while using the spirometer.  You develop fever of 100.5 F (38.1 C) or higher. SEEK IMMEDIATE MEDICAL CARE IF:   You cough up bloody sputum that had not been present before.  You develop fever of 102 F (38.9 C) or greater.  You develop worsening pain at or near the incision site. MAKE SURE YOU:   Understand these instructions.  Will watch your condition.  Will get help right away if you are not doing well or get worse. Document Released: 01/14/2007 Document Revised: 11/26/2011 Document Reviewed: 03/17/2007 Exit[employer] Patient Information 2014 [employer], [place].   ________________________________________________________________________  WHAT IS A BLOOD TRANSFUSION? Blood Transfusion Information  A transfusion is the replacement of blood or some of its parts. Blood is made up of multiple cells which provide different functions.  Red blood cells carry oxygen and are used for blood loss replacement.  White blood cells fight against infection.  Platelets control bleeding.  Plasma helps clot blood.  Other blood products are available for specialized needs, such as hemophilia or other clotting disorders. BEFORE THE TRANSFUSION  Who gives blood for transfusions?   Healthy volunteers who are fully evaluated to make sure their blood is safe. This is blood bank blood. Transfusion therapy is the safest it has ever been in the practice of medicine. Before blood is taken from a donor, a complete history  is taken to make sure that person has no history of diseases nor engages in risky social behavior (examples are intravenous drug use or sexual activity with multiple partners). The donor's travel history is screened to minimize risk of transmitting infections, such as malaria. The donated blood is tested for signs of infectious diseases, such as HIV and hepatitis. The blood is then tested to be sure it is compatible with you in order to minimize the chance of a transfusion reaction. If you or a relative donates blood, this is often done in anticipation of surgery and is not appropriate for emergency situations. It takes many days to process the donated blood. RISKS AND COMPLICATIONS Although transfusion therapy is very safe and saves many lives, the main dangers of transfusion include:   Getting an infectious disease.  Developing a transfusion reaction. This is an allergic reaction to something in the blood you were given. Every precaution is taken to prevent this. The decision to have a blood transfusion has been considered carefully by your caregiver before blood is given. Blood is not given unless the benefits outweigh the risks. AFTER THE TRANSFUSION  Right after receiving a blood  transfusion, you will usually feel much better and more energetic. This is especially true if your red blood cells have gotten low (anemic). The transfusion raises the level of the red blood cells which carry oxygen, and this usually causes an energy increase.  The nurse administering the transfusion will monitor you carefully for complications. HOME CARE INSTRUCTIONS  No special instructions are needed after a transfusion. You may find your energy is better. Speak with your caregiver about any limitations on activity for underlying diseases you may have. SEEK MEDICAL CARE IF:   Your condition is not improving after your transfusion.  You develop redness or irritation at the intravenous (IV) site. SEEK IMMEDIATE  MEDICAL CARE IF:  Any of the following symptoms occur over the next 12 hours:  Shaking chills.  You have a temperature by mouth above 102 F (38.9 C), not controlled by medicine.  Chest, back, or muscle pain.  People around you feel you are not acting correctly or are confused.  Shortness of breath or difficulty breathing.  Dizziness and fainting.  You get a rash or develop hives.  You have a decrease in urine output.  Your urine turns a dark color or changes to pink, red, or brown. Any of the following symptoms occur over the next 10 days:  You have a temperature by mouth above 102 F (38.9 C), not controlled by medicine.  Shortness of breath.  Weakness after normal activity.  The white part of the eye turns yellow (jaundice).  You have a decrease in the amount of urine or are urinating less often.  Your urine turns a dark color or changes to pink, red, or brown. Document Released: 08/31/2000 Document Revised: 11/26/2011 Document Reviewed: 04/19/2008 [facility] Patient Information 2014 [place], [place].  _______________________________________________________________________

## 2016-09-18 ENCOUNTER — Encounter (HOSPITAL_COMMUNITY)
Admission: RE | Admit: 2016-09-18 | Discharge: 2016-09-18 | Disposition: A | Discharge status: Home / Self Care | Payer: Medicare Other | Source: Ambulatory Visit | Attending: Orthopedic Surgery | Admitting: Orthopedic Surgery

## 2016-09-18 ENCOUNTER — Encounter (HOSPITAL_COMMUNITY): Payer: Self-pay | Admitting: *Deleted

## 2016-09-18 DIAGNOSIS — I10 Essential (primary) hypertension: Secondary | ICD-10-CM | POA: Insufficient documentation

## 2016-09-18 DIAGNOSIS — Z01812 Encounter for preprocedural laboratory examination: Secondary | ICD-10-CM | POA: Diagnosis not present

## 2016-09-18 HISTORY — DX: Other specified postprocedural states: R11.2

## 2016-09-18 HISTORY — DX: Allergy status to unspecified drugs, medicaments and biological substances: Z88.9

## 2016-09-18 HISTORY — DX: Adverse effect of unspecified anesthetic, initial encounter: T41.45XA

## 2016-09-18 HISTORY — DX: Gastro-esophageal reflux disease without esophagitis: K21.9

## 2016-09-18 HISTORY — DX: Headache: R51

## 2016-09-18 HISTORY — DX: Other complications of anesthesia, initial encounter: T88.59XA

## 2016-09-18 HISTORY — DX: Unspecified osteoarthritis, unspecified site: M19.90

## 2016-09-18 HISTORY — DX: Essential (primary) hypertension: I10

## 2016-09-18 HISTORY — DX: Other specified postprocedural states: Z98.890

## 2016-09-18 HISTORY — DX: Headache, unspecified: R51.9

## 2016-09-18 LAB — BASIC METABOLIC PANEL
ANION GAP: 10 (ref 5–15)
BUN: 24 mg/dL — AB (ref 6–20)
CALCIUM: 9.5 mg/dL (ref 8.9–10.3)
CO2: 25 mmol/L (ref 22–32)
Chloride: 103 mmol/L (ref 101–111)
Creatinine, Ser: 0.87 mg/dL (ref 0.44–1.00)
GFR calc Af Amer: >60 mL/min (ref >60)
Glucose, Bld: 96 mg/dL (ref 65–99)
POTASSIUM: 3.8 mmol/L (ref 3.5–5.1)
SODIUM: 138 mmol/L (ref 135–145)

## 2016-09-18 LAB — SURGICAL PCR SCREEN
MRSA, PCR: POSITIVE — AB
Staphylococcus aureus: POSITIVE — AB

## 2016-09-18 LAB — CBC
HCT: 38.5 % (ref 36.0–46.0)
Hemoglobin: 13.3 g/dL (ref 12.0–15.0)
MCH: 29.6 pg (ref 26.0–34.0)
MCHC: 34.5 g/dL (ref 30.0–36.0)
MCV: 85.7 fL (ref 78.0–100.0)
PLATELETS: 247 10*3/uL (ref 150–400)
RBC: 4.49 MIL/uL (ref 3.87–5.11)
RDW: 12.2 % (ref 11.5–15.5)
WBC: 7.4 10*3/uL (ref 4.0–10.5)

## 2016-09-18 LAB — ABO/RH: ABO/RH(D): A NEG

## 2016-09-19 NOTE — Progress Notes (Signed)
08/22/2016- Pre-operative clearance and office note from Dr. Lujean Amel with labs- CBC w/diff., CMP, on chart.

## 2016-09-23 NOTE — Anesthesia Preprocedure Evaluation (Addendum)
Anesthesia Evaluation  Patient identified by MRN, date of birth, ID band Patient awake    Reviewed: Allergy & Precautions, NPO status , Patient's Chart, lab work & pertinent test results  History of Anesthesia Complications (+) PONV and history of anesthetic complications  Airway Mallampati: II  TM Distance: >3 FB Neck ROM: Full    Dental no notable dental hx. (+) Dental Advisory Given   Pulmonary neg pulmonary ROS,    Pulmonary exam normal        Cardiovascular hypertension, Pt. on medications negative cardio ROS Normal cardiovascular exam     Neuro/Psych negative neurological ROS  negative psych ROS   GI/Hepatic Neg liver ROS, GERD  ,  Endo/Other  negative endocrine ROS  Renal/GU negative Renal ROS     Musculoskeletal negative musculoskeletal ROS (+)   Abdominal   Peds  Hematology negative hematology ROS (+)   Anesthesia Other Findings Day of surgery medications reviewed with the patient.  Reproductive/Obstetrics                            Anesthesia Physical Anesthesia Plan  ASA: II  Anesthesia Plan: MAC and Spinal   Post-op Pain Management:    Induction:   Airway Management Planned: Natural Airway and Simple Face Mask  Additional Equipment:   Intra-op Plan:   Post-operative Plan:   Informed Consent: I have reviewed the patients History and Physical, chart, labs and discussed the procedure including the risks, benefits and alternatives for the proposed anesthesia with the patient or authorized representative who has indicated his/her understanding and acceptance.   Dental advisory given  Plan Discussed with: CRNA, Anesthesiologist and Surgeon  Anesthesia Plan Comments:        Anesthesia Quick Evaluation

## 2016-09-24 ENCOUNTER — Inpatient Hospital Stay (HOSPITAL_COMMUNITY)
Admission: RE | Admit: 2016-09-24 | Discharge: 2016-09-26 | DRG: 470 | Disposition: A | Discharge status: Home / Self Care | Payer: Medicare Other | Source: Ambulatory Visit | Attending: Orthopedic Surgery | Admitting: Orthopedic Surgery

## 2016-09-24 ENCOUNTER — Encounter (HOSPITAL_COMMUNITY): Payer: Self-pay | Admitting: *Deleted

## 2016-09-24 ENCOUNTER — Inpatient Hospital Stay (HOSPITAL_COMMUNITY): Payer: Medicare Other | Admitting: Anesthesiology

## 2016-09-24 ENCOUNTER — Encounter (HOSPITAL_COMMUNITY)
Admission: RE | Disposition: A | Discharge status: Home / Self Care | Payer: Self-pay | Source: Ambulatory Visit | Attending: Orthopedic Surgery

## 2016-09-24 DIAGNOSIS — Z96659 Presence of unspecified artificial knee joint: Secondary | ICD-10-CM

## 2016-09-24 DIAGNOSIS — Z791 Long term (current) use of non-steroidal anti-inflammatories (NSAID): Secondary | ICD-10-CM

## 2016-09-24 DIAGNOSIS — Z888 Allergy status to other drugs, medicaments and biological substances status: Secondary | ICD-10-CM

## 2016-09-24 DIAGNOSIS — M659 Synovitis and tenosynovitis, unspecified: Secondary | ICD-10-CM | POA: Diagnosis present

## 2016-09-24 DIAGNOSIS — J302 Other seasonal allergic rhinitis: Secondary | ICD-10-CM | POA: Diagnosis present

## 2016-09-24 DIAGNOSIS — M25461 Effusion, right knee: Secondary | ICD-10-CM | POA: Diagnosis present

## 2016-09-24 DIAGNOSIS — K219 Gastro-esophageal reflux disease without esophagitis: Secondary | ICD-10-CM | POA: Diagnosis present

## 2016-09-24 DIAGNOSIS — M1711 Unilateral primary osteoarthritis, right knee: Principal | ICD-10-CM | POA: Diagnosis present

## 2016-09-24 DIAGNOSIS — I10 Essential (primary) hypertension: Secondary | ICD-10-CM | POA: Diagnosis present

## 2016-09-24 DIAGNOSIS — Z96651 Presence of right artificial knee joint: Secondary | ICD-10-CM

## 2016-09-24 DIAGNOSIS — Z880 Allergy status to penicillin: Secondary | ICD-10-CM | POA: Diagnosis not present

## 2016-09-24 DIAGNOSIS — Z886 Allergy status to analgesic agent status: Secondary | ICD-10-CM

## 2016-09-24 DIAGNOSIS — G8918 Other acute postprocedural pain: Secondary | ICD-10-CM | POA: Diagnosis not present

## 2016-09-24 DIAGNOSIS — R51 Headache: Secondary | ICD-10-CM | POA: Diagnosis present

## 2016-09-24 DIAGNOSIS — Z7982 Long term (current) use of aspirin: Secondary | ICD-10-CM

## 2016-09-24 DIAGNOSIS — Z79899 Other long term (current) drug therapy: Secondary | ICD-10-CM | POA: Diagnosis not present

## 2016-09-24 HISTORY — PX: TOTAL KNEE ARTHROPLASTY: SHX125

## 2016-09-24 LAB — TYPE AND SCREEN
ABO/RH(D): A NEG
ANTIBODY SCREEN: NEGATIVE

## 2016-09-24 SURGERY — ARTHROPLASTY, KNEE, TOTAL
Anesthesia: Monitor Anesthesia Care | Site: Knee | Laterality: Right

## 2016-09-24 MED ORDER — LORATADINE 10 MG PO TABS
10.0000 mg | ORAL_TABLET | Freq: Every day | ORAL | Status: DC
  Administered 2016-09-25 – 2016-09-26 (×2): 10 mg via ORAL
  Filled 2016-09-24 (×2): qty 1

## 2016-09-24 MED ORDER — ONDANSETRON HCL 4 MG/2ML IJ SOLN
4.0000 mg | Freq: Four times a day (QID) | INTRAMUSCULAR | Status: DC | PRN
  Administered 2016-09-24: 4 mg via INTRAVENOUS
  Filled 2016-09-24: qty 2

## 2016-09-24 MED ORDER — BUPIVACAINE HCL (PF) 0.25 % IJ SOLN
INTRAMUSCULAR | Status: AC
  Filled 2016-09-24: qty 30

## 2016-09-24 MED ORDER — PROPOFOL 10 MG/ML IV BOLUS
INTRAVENOUS | Status: AC
  Filled 2016-09-24: qty 20

## 2016-09-24 MED ORDER — SODIUM CHLORIDE 0.9 % IR SOLN
Status: DC | PRN
  Administered 2016-09-24: 1000 mL

## 2016-09-24 MED ORDER — METOCLOPRAMIDE HCL 5 MG/ML IJ SOLN: 5.0000 mg | Freq: Three times a day (TID) | INTRAMUSCULAR | Status: DC | PRN

## 2016-09-24 MED ORDER — PROPOFOL 10 MG/ML IV BOLUS
INTRAVENOUS | Status: AC
  Filled 2016-09-24: qty 40

## 2016-09-24 MED ORDER — POLYETHYLENE GLYCOL 3350 17 G PO PACK
17.0000 g | PACK | Freq: Two times a day (BID) | ORAL | Status: DC
  Administered 2016-09-24 – 2016-09-26 (×4): 17 g via ORAL
  Filled 2016-09-24 (×4): qty 1

## 2016-09-24 MED ORDER — TRIAMTERENE-HCTZ 37.5-25 MG PO TABS
1.0000 | ORAL_TABLET | Freq: Every day | ORAL | Status: DC
  Administered 2016-09-25 – 2016-09-26 (×2): 1 via ORAL
  Filled 2016-09-24 (×2): qty 1

## 2016-09-24 MED ORDER — METHOCARBAMOL 500 MG PO TABS
500.0000 mg | ORAL_TABLET | Freq: Four times a day (QID) | ORAL | Status: DC | PRN
  Administered 2016-09-25 – 2016-09-26 (×2): 500 mg via ORAL
  Filled 2016-09-24 (×2): qty 1

## 2016-09-24 MED ORDER — PHENYLEPHRINE 40 MCG/ML (10ML) SYRINGE FOR IV PUSH (FOR BLOOD PRESSURE SUPPORT)
PREFILLED_SYRINGE | INTRAVENOUS | Status: AC
  Filled 2016-09-24: qty 10

## 2016-09-24 MED ORDER — METOCLOPRAMIDE HCL 5 MG PO TABS: 5.0000 mg | ORAL_TABLET | Freq: Three times a day (TID) | ORAL | Status: DC | PRN

## 2016-09-24 MED ORDER — ONDANSETRON HCL 4 MG/2ML IJ SOLN
INTRAMUSCULAR | Status: AC
  Filled 2016-09-24: qty 2

## 2016-09-24 MED ORDER — SCOPOLAMINE 1 MG/3DAYS TD PT72
1.0000 | MEDICATED_PATCH | TRANSDERMAL | Status: DC
  Administered 2016-09-24: 1.5 mg via TRANSDERMAL
  Filled 2016-09-24: qty 1

## 2016-09-24 MED ORDER — PHENYLEPHRINE HCL 10 MG/ML IJ SOLN
INTRAMUSCULAR | Status: DC | PRN
  Administered 2016-09-24: 40 ug via INTRAVENOUS

## 2016-09-24 MED ORDER — ONDANSETRON HCL 4 MG PO TABS
4.0000 mg | ORAL_TABLET | Freq: Four times a day (QID) | ORAL | Status: DC | PRN
  Administered 2016-09-24: 21:00:00 4 mg via ORAL
  Filled 2016-09-24: qty 1

## 2016-09-24 MED ORDER — ROPIVACAINE HCL 5 MG/ML IJ SOLN
INTRAMUSCULAR | Status: AC
  Filled 2016-09-24: qty 30

## 2016-09-24 MED ORDER — METHOCARBAMOL 1000 MG/10ML IJ SOLN
500.0000 mg | Freq: Four times a day (QID) | INTRAMUSCULAR | Status: DC | PRN
  Administered 2016-09-24: 500 mg via INTRAVENOUS
  Filled 2016-09-24: qty 550
  Filled 2016-09-24: qty 5

## 2016-09-24 MED ORDER — KETOROLAC TROMETHAMINE 30 MG/ML IJ SOLN
INTRAMUSCULAR | Status: DC | PRN
  Administered 2016-09-24: 30 mg

## 2016-09-24 MED ORDER — PHENOL 1.4 % MT LIQD: 1.0000 | OROMUCOSAL | Status: DC | PRN

## 2016-09-24 MED ORDER — DOCUSATE SODIUM 100 MG PO CAPS
100.0000 mg | ORAL_CAPSULE | Freq: Two times a day (BID) | ORAL | Status: DC
  Administered 2016-09-24 – 2016-09-26 (×4): 100 mg via ORAL
  Filled 2016-09-24 (×4): qty 1

## 2016-09-24 MED ORDER — BUPIVACAINE HCL (PF) 0.25 % IJ SOLN
INTRAMUSCULAR | Status: DC | PRN
  Administered 2016-09-24: 30 mL

## 2016-09-24 MED ORDER — BUPIVACAINE-EPINEPHRINE (PF) 0.5% -1:200000 IJ SOLN
INTRAMUSCULAR | Status: DC | PRN
  Administered 2016-09-24: 50 mL via PERINEURAL

## 2016-09-24 MED ORDER — DIPHENHYDRAMINE HCL 25 MG PO CAPS: 25.0000 mg | ORAL_CAPSULE | Freq: Four times a day (QID) | ORAL | Status: DC | PRN

## 2016-09-24 MED ORDER — DEXAMETHASONE SODIUM PHOSPHATE 10 MG/ML IJ SOLN
10.0000 mg | Freq: Once | INTRAMUSCULAR | Status: AC
  Administered 2016-09-24: 10 mg via INTRAVENOUS

## 2016-09-24 MED ORDER — LACTATED RINGERS IV SOLN
INTRAVENOUS | Status: DC | PRN
  Administered 2016-09-24 (×2): via INTRAVENOUS

## 2016-09-24 MED ORDER — FENTANYL CITRATE (PF) 100 MCG/2ML IJ SOLN
INTRAMUSCULAR | Status: AC
  Filled 2016-09-24: qty 2

## 2016-09-24 MED ORDER — VANCOMYCIN HCL IN DEXTROSE 1-5 GM/200ML-% IV SOLN
1000.0000 mg | Freq: Two times a day (BID) | INTRAVENOUS | Status: AC
  Administered 2016-09-24: 18:00:00 1000 mg via INTRAVENOUS
  Filled 2016-09-24: qty 200

## 2016-09-24 MED ORDER — ALUM & MAG HYDROXIDE-SIMETH 200-200-20 MG/5ML PO SUSP: 30.0000 mL | ORAL | Status: DC | PRN

## 2016-09-24 MED ORDER — ATORVASTATIN CALCIUM 20 MG PO TABS
20.0000 mg | ORAL_TABLET | Freq: Every day | ORAL | Status: DC
  Administered 2016-09-24 – 2016-09-25 (×2): 20 mg via ORAL
  Filled 2016-09-24 (×3): qty 1

## 2016-09-24 MED ORDER — ONDANSETRON HCL 4 MG/2ML IJ SOLN
INTRAMUSCULAR | Status: DC | PRN
  Administered 2016-09-24: 4 mg via INTRAVENOUS

## 2016-09-24 MED ORDER — BISACODYL 10 MG RE SUPP: 10.0000 mg | Freq: Every day | RECTAL | Status: DC | PRN

## 2016-09-24 MED ORDER — FERROUS SULFATE 325 (65 FE) MG PO TABS
325.0000 mg | ORAL_TABLET | Freq: Three times a day (TID) | ORAL | Status: DC
  Administered 2016-09-24 – 2016-09-26 (×6): 325 mg via ORAL
  Filled 2016-09-24 (×6): qty 1

## 2016-09-24 MED ORDER — PROPOFOL 500 MG/50ML IV EMUL
INTRAVENOUS | Status: DC | PRN
  Administered 2016-09-24: 100 ug/kg/min via INTRAVENOUS

## 2016-09-24 MED ORDER — KETOROLAC TROMETHAMINE 30 MG/ML IJ SOLN
INTRAMUSCULAR | Status: AC
  Filled 2016-09-24: qty 1

## 2016-09-24 MED ORDER — BUPIVACAINE IN DEXTROSE 0.75-8.25 % IT SOLN
INTRATHECAL | Status: DC | PRN
  Administered 2016-09-24: 15 mg via INTRATHECAL

## 2016-09-24 MED ORDER — HYDROMORPHONE HCL 1 MG/ML IJ SOLN
0.2500 mg | INTRAMUSCULAR | Status: DC | PRN
  Administered 2016-09-24: 0.5 mg via INTRAVENOUS

## 2016-09-24 MED ORDER — MIDAZOLAM HCL 5 MG/5ML IJ SOLN
INTRAMUSCULAR | Status: DC | PRN
  Administered 2016-09-24: 2 mg via INTRAVENOUS

## 2016-09-24 MED ORDER — 0.9 % SODIUM CHLORIDE (POUR BTL) OPTIME
TOPICAL | Status: DC | PRN
  Administered 2016-09-24: 1000 mL

## 2016-09-24 MED ORDER — PROPOFOL 500 MG/50ML IV EMUL
INTRAVENOUS | Status: DC | PRN
  Administered 2016-09-24: 20 mg via INTRAVENOUS

## 2016-09-24 MED ORDER — HYDROCODONE-ACETAMINOPHEN 7.5-325 MG PO TABS
1.0000 | ORAL_TABLET | ORAL | Status: DC
  Administered 2016-09-24: 16:00:00 2 via ORAL
  Administered 2016-09-24: 11:00:00 1 via ORAL
  Administered 2016-09-24 – 2016-09-25 (×6): 2 via ORAL
  Administered 2016-09-25: 14:00:00 1 via ORAL
  Administered 2016-09-26 (×4): 2 via ORAL
  Filled 2016-09-24 (×9): qty 2
  Filled 2016-09-24: qty 1
  Filled 2016-09-24 (×4): qty 2

## 2016-09-24 MED ORDER — MAGNESIUM CITRATE PO SOLN: 1.0000 | Freq: Once | ORAL | Status: DC | PRN

## 2016-09-24 MED ORDER — CEFAZOLIN SODIUM-DEXTROSE 2-4 GM/100ML-% IV SOLN: 2.0000 g | INTRAVENOUS | Status: DC

## 2016-09-24 MED ORDER — SODIUM CHLORIDE 0.9 % IV SOLN
INTRAVENOUS | Status: DC
  Administered 2016-09-24: 11:00:00 via INTRAVENOUS
  Filled 2016-09-24 (×7): qty 1000

## 2016-09-24 MED ORDER — CHLORHEXIDINE GLUCONATE 4 % EX LIQD
60.0000 mL | Freq: Once | CUTANEOUS | Status: DC
Start: 2016-09-24 — End: 2016-09-24

## 2016-09-24 MED ORDER — AMLODIPINE BESYLATE 10 MG PO TABS
10.0000 mg | ORAL_TABLET | Freq: Every day | ORAL | Status: DC
  Administered 2016-09-25 – 2016-09-26 (×2): 10 mg via ORAL
  Filled 2016-09-24 (×2): qty 1

## 2016-09-24 MED ORDER — DEXAMETHASONE SODIUM PHOSPHATE 10 MG/ML IJ SOLN
10.0000 mg | Freq: Once | INTRAMUSCULAR | Status: AC
  Administered 2016-09-25: 10 mg via INTRAVENOUS
  Filled 2016-09-24: qty 1

## 2016-09-24 MED ORDER — NON FORMULARY: 20.0000 mg | Freq: Every day | Status: DC

## 2016-09-24 MED ORDER — SODIUM CHLORIDE 0.9 % IV SOLN
1000.0000 mg | INTRAVENOUS | Status: AC
  Administered 2016-09-24: 1000 mg via INTRAVENOUS
  Filled 2016-09-24: qty 1100

## 2016-09-24 MED ORDER — SODIUM CHLORIDE 0.9 % IJ SOLN
INTRAMUSCULAR | Status: AC
  Filled 2016-09-24: qty 50

## 2016-09-24 MED ORDER — VANCOMYCIN HCL IN DEXTROSE 1-5 GM/200ML-% IV SOLN
INTRAVENOUS | Status: AC
  Filled 2016-09-24: qty 200

## 2016-09-24 MED ORDER — FENTANYL CITRATE (PF) 100 MCG/2ML IJ SOLN
INTRAMUSCULAR | Status: DC | PRN
  Administered 2016-09-24: 25 ug via INTRAVENOUS

## 2016-09-24 MED ORDER — CELECOXIB 200 MG PO CAPS
200.0000 mg | ORAL_CAPSULE | Freq: Two times a day (BID) | ORAL | Status: DC
  Administered 2016-09-24 – 2016-09-26 (×4): 200 mg via ORAL
  Filled 2016-09-24 (×4): qty 1

## 2016-09-24 MED ORDER — OMEPRAZOLE 20 MG PO CPDR
20.0000 mg | DELAYED_RELEASE_CAPSULE | Freq: Every day | ORAL | Status: DC
  Administered 2016-09-25 – 2016-09-26 (×2): 20 mg via ORAL
  Filled 2016-09-24 (×3): qty 1

## 2016-09-24 MED ORDER — HYDROMORPHONE HCL 1 MG/ML IJ SOLN
0.5000 mg | INTRAMUSCULAR | Status: DC | PRN
  Administered 2016-09-24: 0.5 mg via INTRAVENOUS
  Filled 2016-09-24: qty 1

## 2016-09-24 MED ORDER — VANCOMYCIN HCL 1000 MG IV SOLR
INTRAVENOUS | Status: DC | PRN
  Administered 2016-09-24: 1000 mg via INTRAVENOUS

## 2016-09-24 MED ORDER — PROMETHAZINE HCL 25 MG/ML IJ SOLN: 6.2500 mg | INTRAMUSCULAR | Status: DC | PRN

## 2016-09-24 MED ORDER — POTASSIUM CHLORIDE CRYS ER 20 MEQ PO TBCR
20.0000 meq | EXTENDED_RELEASE_TABLET | Freq: Every day | ORAL | Status: DC
  Administered 2016-09-24 – 2016-09-26 (×3): 20 meq via ORAL
  Filled 2016-09-24 (×3): qty 1

## 2016-09-24 MED ORDER — ASPIRIN 81 MG PO CHEW
81.0000 mg | CHEWABLE_TABLET | Freq: Two times a day (BID) | ORAL | Status: DC
  Administered 2016-09-24 – 2016-09-26 (×4): 81 mg via ORAL
  Filled 2016-09-24 (×4): qty 1

## 2016-09-24 MED ORDER — ACYCLOVIR 400 MG PO TABS
400.0000 mg | ORAL_TABLET | Freq: Three times a day (TID) | ORAL | Status: DC | PRN
Start: 2016-09-24 — End: 2016-09-26

## 2016-09-24 MED ORDER — MIDAZOLAM HCL 2 MG/2ML IJ SOLN
INTRAMUSCULAR | Status: AC
  Filled 2016-09-24: qty 2

## 2016-09-24 MED ORDER — HYDROMORPHONE HCL 1 MG/ML IJ SOLN
INTRAMUSCULAR | Status: AC
  Filled 2016-09-24: qty 1

## 2016-09-24 MED ORDER — MENTHOL 3 MG MT LOZG
1.0000 | LOZENGE | OROMUCOSAL | Status: DC | PRN
  Administered 2016-09-24: 16:00:00 3 mg via ORAL
  Filled 2016-09-24: qty 9

## 2016-09-24 MED ORDER — DEXAMETHASONE SODIUM PHOSPHATE 10 MG/ML IJ SOLN
INTRAMUSCULAR | Status: AC
  Filled 2016-09-24: qty 1

## 2016-09-24 MED ORDER — SODIUM CHLORIDE 0.9 % IJ SOLN
INTRAMUSCULAR | Status: DC | PRN
  Administered 2016-09-24: 30 mL

## 2016-09-24 SURGICAL SUPPLY — 46 items
ADH SKN CLS APL DERMABOND .7 (GAUZE/BANDAGES/DRESSINGS) ×1
BAG DECANTER FOR FLEXI CONT (MISCELLANEOUS) IMPLANT
BAG SPEC THK2 15X12 ZIP CLS (MISCELLANEOUS)
BAG ZIPLOCK 12X15 (MISCELLANEOUS) IMPLANT
BANDAGE ACE 6X5 VEL STRL LF (GAUZE/BANDAGES/DRESSINGS) ×3 IMPLANT
BLADE SAW SGTL 13.0X1.19X90.0M (BLADE) ×3 IMPLANT
BOWL SMART MIX CTS (DISPOSABLE) ×3 IMPLANT
CAPT KNEE TOTAL 3 ATTUNE ×2 IMPLANT
CEMENT HV SMART SET (Cement) ×4 IMPLANT
CLOTH BEACON ORANGE TIMEOUT ST (SAFETY) ×3 IMPLANT
CUFF TOURN SGL QUICK 34 (TOURNIQUET CUFF) ×3
CUFF TRNQT CYL 34X4X40X1 (TOURNIQUET CUFF) ×1 IMPLANT
DECANTER SPIKE VIAL GLASS SM (MISCELLANEOUS) ×3 IMPLANT
DERMABOND ADVANCED (GAUZE/BANDAGES/DRESSINGS) ×2
DERMABOND ADVANCED .7 DNX12 (GAUZE/BANDAGES/DRESSINGS) ×1 IMPLANT
DRAPE U-SHAPE 47X51 STRL (DRAPES) ×3 IMPLANT
DRESSING AQUACEL AG SP 3.5X10 (GAUZE/BANDAGES/DRESSINGS) ×1 IMPLANT
DRSG AQUACEL AG SP 3.5X10 (GAUZE/BANDAGES/DRESSINGS) ×3
DURAPREP 26ML APPLICATOR (WOUND CARE) ×6 IMPLANT
ELECT REM PT RETURN 9FT ADLT (ELECTROSURGICAL) ×3
ELECTRODE REM PT RTRN 9FT ADLT (ELECTROSURGICAL) ×1 IMPLANT
GLOVE BIOGEL M 7.0 STRL (GLOVE) IMPLANT
GLOVE BIOGEL PI IND STRL 7.5 (GLOVE) ×1 IMPLANT
GLOVE BIOGEL PI IND STRL 8.5 (GLOVE) ×1 IMPLANT
GLOVE BIOGEL PI INDICATOR 7.5 (GLOVE) ×10
GLOVE BIOGEL PI INDICATOR 8.5 (GLOVE) ×2
GLOVE ECLIPSE 8.0 STRL XLNG CF (GLOVE) ×5 IMPLANT
GLOVE ORTHO TXT STRL SZ7.5 (GLOVE) ×6 IMPLANT
GOWN STRL REUS W/TWL LRG LVL3 (GOWN DISPOSABLE) ×3 IMPLANT
GOWN STRL REUS W/TWL XL LVL3 (GOWN DISPOSABLE) ×7 IMPLANT
HANDPIECE INTERPULSE COAX TIP (DISPOSABLE) ×3
MANIFOLD NEPTUNE II (INSTRUMENTS) ×3 IMPLANT
PACK TOTAL KNEE CUSTOM (KITS) ×3 IMPLANT
POSITIONER SURGICAL ARM (MISCELLANEOUS) ×3 IMPLANT
SET HNDPC FAN SPRY TIP SCT (DISPOSABLE) ×1 IMPLANT
SET PAD KNEE POSITIONER (MISCELLANEOUS) ×3 IMPLANT
SUT MNCRL AB 4-0 PS2 18 (SUTURE) ×3 IMPLANT
SUT VIC AB 1 CT1 36 (SUTURE) ×3 IMPLANT
SUT VIC AB 2-0 CT1 27 (SUTURE) ×9
SUT VIC AB 2-0 CT1 TAPERPNT 27 (SUTURE) ×3 IMPLANT
SUT VLOC 180 0 24IN GS25 (SUTURE) ×3 IMPLANT
SYR 50ML LL SCALE MARK (SYRINGE) ×3 IMPLANT
TRAY FOLEY BAG SILVER LF 14FR (CATHETERS) ×2 IMPLANT
WATER STERILE IRR 1500ML POUR (IV SOLUTION) ×5 IMPLANT
WRAP KNEE MAXI GEL POST OP (GAUZE/BANDAGES/DRESSINGS) ×3 IMPLANT
YANKAUER SUCT BULB TIP 10FT TU (MISCELLANEOUS) ×3 IMPLANT

## 2016-09-24 NOTE — Anesthesia Procedure Notes (Addendum)
Anesthesia Regional Block:  Adductor canal block  Pre-Anesthetic Checklist: ,, timeout performed, Correct Patient, Correct Site, Correct Laterality, Correct Procedure, Correct Position, site marked, Risks and benefits discussed,  Surgical consent,  Pre-op evaluation,  At surgeon's request and post-op pain management  Laterality: Right  Prep: chloraprep       Needles:  Injection technique: Single-shot  Needle Type: Stimulator Needle - 80     Needle Length: 10cm 10 cm Needle Gauge: 21 and 21 G    Additional Needles:  Procedures: ultrasound guided (picture in chart) Adductor canal block Narrative:  Start time: 09/24/2016 7:02 AM End time: 09/24/2016 7:12 AM Injection made incrementally with aspirations every 5 mL.  Performed by: Personally

## 2016-09-24 NOTE — Anesthesia Postprocedure Evaluation (Addendum)
Anesthesia Post Note  Patient: Gwendolyn Morrow  Procedure(s) Performed: Procedure(s) (LRB): TOTAL KNEE ARTHROPLASTY (Right)  Patient location during evaluation: PACU Anesthesia Type: MAC Level of consciousness: awake and alert Pain management: pain level controlled Vital Signs Assessment: post-procedure vital signs reviewed and stable Respiratory status: spontaneous breathing and respiratory function stable Cardiovascular status: blood pressure returned to baseline and stable Postop Assessment: spinal receding Anesthetic complications: no       Last Vitals:  Vitals:   09/24/16 1000 09/24/16 1009  BP: 119/64 124/70  Pulse: (!) 54 61  Resp: 12 11  Temp:  36.2 C    Last Pain:  Vitals:   09/24/16 1000  TempSrc:   PainSc: 4                  Ledon Weihe DANIEL

## 2016-09-24 NOTE — Anesthesia Procedure Notes (Signed)
Spinal  Patient location during procedure: OR Start time: 09/24/2016 7:39 AM End time: 09/24/2016 7:49 AM Staffing Anesthesiologist: Duane Boston Performed: anesthesiologist  Preanesthetic Checklist Completed: patient identified, surgical consent, pre-op evaluation, timeout performed, IV checked, risks and benefits discussed and monitors and equipment checked Spinal Block Patient position: sitting Prep: DuraPrep Patient monitoring: cardiac monitor, continuous pulse ox and blood pressure Approach: midline Location: L2-3 Injection technique: single-shot Needle Needle type: Pencan  Needle gauge: 24 G Needle length: 9 cm Additional Notes Functioning IV was confirmed and monitors were applied. Sterile prep and drape, including hand hygiene and sterile gloves were used. The patient was positioned and the spine was prepped. The skin was anesthetized with lidocaine.  Free flow of clear CSF was obtained prior to injecting local anesthetic into the CSF.  The spinal needle aspirated freely following injection.  The needle was carefully withdrawn.  The patient tolerated the procedure well.

## 2016-09-24 NOTE — Transfer of Care (Signed)
Immediate Anesthesia Transfer of Care Note  Patient: Gwendolyn Morrow  Procedure(s) Performed: Procedure(s): TOTAL KNEE ARTHROPLASTY (Right)  Patient Location: PACU  Anesthesia Type:MAC and Spinal  Level of Consciousness: awake, alert  and oriented  Airway & Oxygen Therapy: Patient Spontanous Breathing and Patient connected to face mask oxygen  Post-op Assessment: Report given to RN and Post -op Vital signs reviewed and stable  Post vital signs: Reviewed and stable  Last Vitals:  Vitals:   09/24/16 0545  BP: 139/71  Pulse: 72  Resp: 16  Temp: 36.6 C    Last Pain:  Vitals:   09/24/16 0607  TempSrc:   PainSc: 4       Patients Stated Pain Goal: 4 (57/26/20 3559)  Complications: No apparent anesthesia complications

## 2016-09-24 NOTE — Op Note (Signed)
NAME:  Gwendolyn Morrow RECORD NO.:  [id number]                             FACILITY:  [facility]      PHYSICIAN:  Pietro Cassis. Alvan Dame, M.D.  DATE OF BIRTH:  1941-08-12      DATE OF PROCEDURE:  09/24/2016                                     OPERATIVE REPORT         PREOPERATIVE DIAGNOSIS:  Right knee osteoarthritis.      POSTOPERATIVE DIAGNOSIS:  Right knee osteoarthritis.      FINDINGS:  The patient was noted to have complete loss of cartilage and   bone-on-bone arthritis with associated osteophytes in all three compartments of   the knee (worse medially) with a significant synovitis and associated effusion.      PROCEDURE:  Right total knee replacement.      COMPONENTS USED:  DePuy Attune rotating platform posterior stabilized knee   system, a size 4 femur, 4 tibia, size 7 PS AOX insert, and 35 anatomic patellar   button.      SURGEON:  Pietro Cassis. Alvan Dame, M.D.      ASSISTANT:  Danae Orleans, PA-C.      ANESTHESIA:  Regional and Spinal.      SPECIMENS:  None.      COMPLICATION:  None.      DRAINS:  None.  EBL: <100cc      TOURNIQUET TIME:   Total Tourniquet Time Documented: Thigh (Right) - 27 minutes Total: Thigh (Right) - 27 minutes  .      The patient was stable to the recovery room.      INDICATION FOR PROCEDURE:  Gwendolyn Morrow is a 76 y.o. female patient of   mine.  The patient had been seen, evaluated, and treated conservatively in the   office with medication, activity modification, and injections.  The patient had   radiographic changes of bone-on-bone arthritis with endplate sclerosis and osteophytes noted.      The patient failed conservative measures including medication, injections, and activity modification, and at this point was ready for more definitive measures.   Based on the radiographic changes and failed conservative measures, the patient   decided to proceed with total knee replacement.  Risks of infection,   DVT, component  failure, need for revision surgery, postop course, and   expectations were all   discussed and reviewed.  Consent was obtained for benefit of pain   relief.      PROCEDURE IN DETAIL:  The patient was brought to the operative theater.   Once adequate anesthesia, preoperative antibiotics, 1 gm of Vancomycin, 1 gm of Tranexamic Acid, and 10 mg of Decadron administered, the patient was positioned supine with the right thigh tourniquet placed.  The  right lower extremity was prepped and draped in sterile fashion.  A time-   out was performed identifying the patient, planned procedure, and   extremity.      The right lower extremity was placed in the [facility] leg holder.  The leg was   exsanguinated, tourniquet elevated to 250 mmHg.  A midline incision was  made followed by median parapatellar arthrotomy.  Following initial   exposure, attention was first directed to the patella.  Precut   measurement was noted to be 21 mm.  I resected down to 13 mm and used a   35 anatomic patellar button to restore patellar height as well as cover the cut   surface.      The lug holes were drilled and a metal shim was placed to protect the   patella from retractors and saw blades.      At this point, attention was now directed to the femur.  The femoral   canal was opened with a drill, irrigated to try to prevent fat emboli.  An   intramedullary rod was passed at 3 degrees valgus, 9 mm of bone was   resected off the distal femur.  Following this resection, the tibia was   subluxated anteriorly.  Using the extramedullary guide, 2 mm of bone was resected off   the proximal medial tibia.  We confirmed the gap would be   stable medially and laterally with a size 5 spacer block as well as confirmed   the cut was perpendicular in the coronal plane, checking with an alignment rod.      Once this was done, I sized the femur to be a size 4 in the anterior-   posterior dimension, chose a standard component based on  medial and   lateral dimension.  The size 4 rotation block was then pinned in   position anterior referenced using the C-clamp to set rotation.  The   anterior, posterior, and  chamfer cuts were made without difficulty nor   notching making certain that I was along the anterior cortex to help   with flexion gap stability.      The final box cut was made off the lateral aspect of distal femur.      At this point, the tibia was sized to be a size 4, the size 4 tray was   then pinned in position through the medial third of the tubercle,   drilled, and keel punched.  Trial reduction was now carried with a 4 femur,  4 tibia, a size 6 then 7 PS insert, and the 35 anatomic patella botton.  The knee was brought to   extension, full extension with good flexion stability with the patella   tracking through the trochlea without application of pressure.  Given   all these findings the femoral lug holes were drilled and then the trial components removed.  Final components were   opened and cement was mixed.  The knee was irrigated with normal saline   solution and pulse lavage.  The synovial lining was   then injected with 30 cc of 0.25% Marcaine without epinephrine and 1 cc of Toradol plus 30 cc of NS for a total of 61 cc.      The knee was irrigated.  Final implants were then cemented onto clean and   dried cut surfaces of bone with the knee brought to extension with a size 7 PS trial insert.      Once the cement had fully cured, the excess cement was removed   throughout the knee.  I confirmed I was satisfied with the range of   motion and stability, and the final size 7 PS AOX insert was chosen.  It was   placed into the knee.      The tourniquet had been let down at 27  minutes.  No significant   hemostasis required.  The   extensor mechanism was then reapproximated using #1 Vicryl and #0 V-lock sutures with the knee   in flexion.  The   remaining wound was closed with 2-0 Vicryl and running  4-0 Monocryl.   The knee was cleaned, dried, dressed sterilely using Dermabond and   Aquacel dressing.  The patient was then   brought to recovery room in stable condition, tolerating the procedure   well.   Please note that Physician Assistant, Danae Orleans, PA-C, was present for the entirety of the case, and was utilized for pre-operative positioning, peri-operative retractor management, general facilitation of the procedure.  He was also utilized for primary wound closure at the end of the case.              Pietro Cassis Alvan Dame, M.D.    09/24/2016 9:01 AM

## 2016-09-24 NOTE — Evaluation (Signed)
Physical Therapy Evaluation Patient Details Name: Gwendolyn Morrow MRN: [id number] DOB: 12/15/1940 Today's Date: 09/24/2016   History of Present Illness  s/p R TKA  Clinical Impression  Pt admitted with above diagnosis. Pt currently with functional limitations due to the deficits listed below (see PT Problem List).  Pt will benefit from skilled PT to increase their independence and safety with mobility to allow discharge to the venue listed below.   Pt doing very well--amb 37' with RW and min/guard assist; pain well controlled at time of PT session; will see again in am    Follow Up Recommendations Home health PT;Outpatient PT (per MD)    Equipment Recommendations  Rolling walker with 5" wheels (if pt desires--has husband's std W)    Recommendations for Other Services       Precautions / Restrictions Precautions Precautions: Fall;Knee Restrictions Weight Bearing Restrictions: No Other Position/Activity Restrictions: WBAT      Mobility  Bed Mobility Overal bed mobility: Needs Assistance Bed Mobility: Supine to Sit     Supine to sit: Min assist     General bed mobility comments: assist with RLE  Transfers Overall transfer level: Needs assistance Equipment used: Rolling walker (2 wheeled) Transfers: Sit to/from Stand Sit to Stand: Min assist         General transfer comment: light min assist to rise and stabilize, cues for hand placement and RLE management  Ambulation/Gait Ambulation/Gait assistance: Min assist-min/guard Ambulation Distance (Feet): 70 Feet Assistive device: Rolling walker (2 wheeled) Gait Pattern/deviations: Step-to pattern;Step-through pattern Gait velocity: decr;    General Gait Details: cues for gait progression, RW position from self  Stairs            Wheelchair Mobility    Modified Rankin (Stroke Patients Only)       Balance                                             Pertinent Vitals/Pain Pain Assessment:  0-10 Pain Score: 3  Pain Location: R knee Pain Descriptors / Indicators: Sore Pain Intervention(s): Limited activity within patient's tolerance;Monitored during session    Home Living Family/patient expects to be discharged to:: Private residence Living Arrangements: Spouse/significant other   Type of Home: House       Home Layout: Two level;Able to live on main level with bedroom/bathroom Home Equipment: Walker - standard;Bedside commode      Prior Function Level of Independence: Independent; pt was working out a few days a wk prior to surgery               Hand Dominance        Extremity/Trunk Assessment   Upper Extremity Assessment Upper Extremity Assessment: Defer to OT evaluation    Lower Extremity Assessment Lower Extremity Assessment: RLE deficits/detail RLE Deficits / Details: ankle WFL, knee flexion ~-8 to 50* AAROM; knee extension with hip flexion 2+/5, limited by anticipated post op pain and weakness       Communication   Communication: No difficulties  Cognition Arousal/Alertness: Awake/alert Behavior During Therapy: WFL for tasks assessed/performed Overall Cognitive Status: Within Functional Limits for tasks assessed                      General Comments      Exercises Total Joint Exercises Ankle Circles/Pumps: AROM;Both;10 reps Quad Sets: Both;10 reps;Strengthening;AROM   Assessment/Plan  PT Assessment Patient needs continued PT services  PT Problem List            PT Treatment Interventions DME instruction;Gait training;Functional mobility training;Therapeutic activities;Therapeutic exercise;Stair training    PT Goals (Current goals can be found in the Care Plan section)  Acute Rehab PT Goals Patient Stated Goal: return to prior activities with less pain PT Goal Formulation: With patient Time For Goal Achievement: 09/27/16 Potential to Achieve Goals: Good    Frequency 7X/week   Barriers to discharge         Co-evaluation               End of Session Equipment Utilized During Treatment: Gait belt Activity Tolerance: Patient tolerated treatment well Patient left: with call bell/phone within reach;in chair;with chair alarm set           Time: 4098-1191 PT Time Calculation (min) (ACUTE ONLY): 30 min   Charges:   PT Evaluation $PT Eval Low Complexity: 1 Procedure PT Treatments $Gait Training: 8-22 mins   PT G Codes:        Gwendolyn Morrow Oct 15, 2016, 2:26 PM

## 2016-09-24 NOTE — Interval H&P Note (Signed)
History and Physical Interval Note:  09/24/2016 7:27 AM  Gwendolyn Morrow  has presented today for surgery, with the diagnosis of RIGHT KNEE OSTEOARTHRITIS  The various methods of treatment have been discussed with the patient and family. After consideration of risks, benefits and other options for treatment, the patient has consented to  Procedure(s): TOTAL KNEE ARTHROPLASTY (Right) as a surgical intervention .  The patient's history has been reviewed, patient examined, no change in status, stable for surgery.  I have reviewed the patient's chart and labs.  Questions were answered to the patient's satisfaction.     Mauri Pole

## 2016-09-25 LAB — BASIC METABOLIC PANEL
ANION GAP: 7 (ref 5–15)
BUN: 14 mg/dL (ref 6–20)
CALCIUM: 8.8 mg/dL — AB (ref 8.9–10.3)
CO2: 26 mmol/L (ref 22–32)
Chloride: 104 mmol/L (ref 101–111)
Creatinine, Ser: 0.77 mg/dL (ref 0.44–1.00)
GFR calc Af Amer: >60 mL/min (ref >60)
Glucose, Bld: 124 mg/dL — ABNORMAL HIGH (ref 65–99)
POTASSIUM: 4 mmol/L (ref 3.5–5.1)
SODIUM: 137 mmol/L (ref 135–145)

## 2016-09-25 LAB — CBC
HCT: 31.4 % — ABNORMAL LOW (ref 36.0–46.0)
Hemoglobin: 10.7 g/dL — ABNORMAL LOW (ref 12.0–15.0)
MCH: 30.1 pg (ref 26.0–34.0)
MCHC: 34.1 g/dL (ref 30.0–36.0)
MCV: 88.2 fL (ref 78.0–100.0)
PLATELETS: 203 10*3/uL (ref 150–400)
RBC: 3.56 MIL/uL — AB (ref 3.87–5.11)
RDW: 12.2 % (ref 11.5–15.5)
WBC: 14.8 10*3/uL — AB (ref 4.0–10.5)

## 2016-09-25 NOTE — Progress Notes (Signed)
Physical Therapy Treatment Patient Details Name: Gwendolyn Morrow MRN: [id number] DOB: September 18, 1940 Today's Date: 09/25/2016    History of Present Illness s/p R TKA    PT Comments    Progressing well,  R knee flexion ~ -5 to 42* AAROM, pt is very motivated   Follow Up Recommendations  Outpatient PT;Supervision - Intermittent     Equipment Recommendations  Rolling walker with 5" wheels    Recommendations for Other Services       Precautions / Restrictions Precautions Precautions: Fall;Knee Restrictions Weight Bearing Restrictions: No Other Position/Activity Restrictions: WBAT    Mobility  Bed Mobility Overal bed mobility: Needs Assistance Bed Mobility: Supine to Sit     Supine to sit: Supervision     General bed mobility comments: incr time  Transfers Overall transfer level: Needs assistance Equipment used: Rolling walker (2 wheeled) Transfers: Sit to/from Stand Sit to Stand: Min guard;Min assist         General transfer comment: light min assist to rise and stabilize-slight dizziness that dissipated quickly, cues for hand placement and RLE management  Ambulation/Gait Ambulation/Gait assistance: Min assist Ambulation Distance (Feet): 90 Feet (10' more) Assistive device: Rolling walker (2 wheeled) Gait Pattern/deviations: Step-to pattern;Step-through pattern Gait velocity: decr   General Gait Details: cues for posture and step length, position from [employer]            Wheelchair Mobility    Modified Rankin (Stroke Patients Only)       Balance                                    Cognition Arousal/Alertness: Awake/alert Behavior During Therapy: WFL for tasks assessed/performed Overall Cognitive Status: Within Functional Limits for tasks assessed                      Exercises Total Joint Exercises Ankle Circles/Pumps: AROM;Both;10 reps Quad Sets: Both;10 reps;Strengthening;AROM Short Arc Quad: AROM;Right;10  reps Heel Slides: AROM;AAROM;Right;10 reps Hip ABduction/ADduction: AROM;Strengthening;Right;10 reps Straight Leg Raises: AROM;Strengthening;5 reps;Limitations Straight Leg Raises Limitations: pain increasing    General Comments        Pertinent Vitals/Pain Pain Assessment: 0-10 Pain Score: 3  Pain Location: R knee Pain Descriptors / Indicators: Sore Pain Intervention(s): Limited activity within patient's tolerance;Monitored during session;Premedicated before session;Ice applied    Home Living                      Prior Function            PT Goals (current goals can now be found in the care plan section) Acute Rehab PT Goals Patient Stated Goal: return to prior activities with less pain PT Goal Formulation: With patient Time For Goal Achievement: 09/27/16 Potential to Achieve Goals: Good Progress towards PT goals: Progressing toward goals    Frequency    7X/week      PT Plan Current plan remains appropriate    Co-evaluation             End of Session Equipment Utilized During Treatment: Gait belt Activity Tolerance: Patient tolerated treatment well Patient left: with call bell/phone within reach;in chair;with chair alarm set     Time: [id number] PT Time Calculation (min) (ACUTE ONLY): 32 min  Charges:  $Gait Training: 8-22 mins $Therapeutic Exercise: 8-22 mins  G CodesKenyon Ana 09/25/2016, 11:38 AM

## 2016-09-25 NOTE — Care Management Note (Signed)
Case Management Note  Patient Details  Name: Gwendolyn Morrow MRN: [id number] Date of Birth: 1941-03-24  Subjective/Objective:                  TOTAL KNEE ARTHROPLASTY (Right) Action/Plan: Discharge planning Expected Discharge Date:  09/26/16               Expected Discharge Plan:  Home/Self Care  In-House Referral:     Discharge planning Services  CM Consult  Post Acute Care Choice:    Choice offered to:  Patient  DME Arranged:  N/A DME Agency:  NA  HH Arranged:  NA HH Agency:  NA  Status of Service:  Completed, signed off  If discussed at [place] of Stay Meetings, dates discussed:    Additional Comments: CM met with pt to confirm plan is for outpt PT; pt confirms.  Pt states she has all DME needed at home.  No other CM needs were communicated. Dellie Catholic, RN 09/25/2016, 10:47 AM

## 2016-09-25 NOTE — Progress Notes (Signed)
   09/25/16 1300  PT Visit Information  Last PT Received On 09/25/16  Assistance Needed +1  History of Present Illness s/p R TKA  Subjective Data  Patient Stated Goal return to prior activities with less pain  Precautions  Precautions Fall;Knee  Restrictions  Other Position/Activity Restrictions WBAT  Pain Assessment  Pain Assessment 0-10  Pain Score 7  Pain Location R knee  Pain Descriptors / Indicators Tingling;Sore  Pain Intervention(s) Limited activity within patient's tolerance;Monitored during session;Ice applied;Patient requesting pain meds-RN notified  Cognition  Arousal/Alertness Awake/alert  Behavior During Therapy WFL for tasks assessed/performed  Overall Cognitive Status Within Functional Limits for tasks assessed  Bed Mobility  Overal bed mobility Needs Assistance  Bed Mobility Supine to Sit;Sit to Supine  Supine to sit Supervision  Sit to supine Supervision  General bed mobility comments extra time; pt used bil UEs to assist RLE back to bed  Transfers  Overall transfer level Needs assistance  Equipment used Rolling walker (2 wheeled)  Transfers Sit to/from Stand  Sit to Stand Min guard  General transfer comment cues for hand placement  Ambulation/Gait  Ambulation/Gait assistance Min guard  Ambulation Distance (Feet) 120 Feet (10' )  Assistive device Rolling walker (2 wheeled)  Gait Pattern/deviations Step-to pattern;Step-through pattern  General Gait Details cues for posture and step length, position from RW, gait progression (step through)  Gait velocity decr  PT - End of Session  Activity Tolerance Patient tolerated treatment well  Patient left in bed;with call bell/phone within reach (unable to set alarm)  PT - Assessment/Plan  PT Plan Current plan remains appropriate  PT Frequency (ACUTE ONLY) 7X/week  Follow Up Recommendations Outpatient PT;Supervision - Intermittent  PT equipment Rolling walker with 5" wheels  PT Goal Progression  Progress  towards PT goals Progressing toward goals  Acute Rehab PT Goals  PT Goal Formulation With patient  Time For Goal Achievement 09/27/16  Potential to Achieve Goals Good  PT Time Calculation  PT Start Time (ACUTE ONLY) 1326  PT Stop Time (ACUTE ONLY) 1349  PT Time Calculation (min) (ACUTE ONLY) 23 min  PT General Charges  $$ ACUTE PT VISIT 1 Procedure  PT Treatments  $Gait Training 23-37 mins

## 2016-09-25 NOTE — Progress Notes (Signed)
Occupational Therapy Treatment Patient Details Name: Gwendolyn Morrow MRN: [id number] DOB: [birth year] Today's Date: 09/25/2016    History of present illness s/p R TKA   OT comments  Pt moving well but fatiques easily  Follow Up Recommendations  No OT follow up;Supervision/Assistance - 24 hour    Equipment Recommendations  None recommended by OT    Recommendations for Other Services      Precautions / Restrictions Precautions Precautions: Fall;Knee Restrictions Weight Bearing Restrictions: No Other Position/Activity Restrictions: WBAT       Mobility Bed Mobility Overal bed mobility: Needs Assistance Bed Mobility: Supine to Sit     Supine to sit: Supervision     General bed mobility comments: extra time; pt used bil UEs to assist RLE back to bed  Transfers Overall transfer level: Needs assistance Equipment used: Rolling walker (2 wheeled) Transfers: Sit to/from Stand Sit to Stand: Min guard         General transfer comment: elevated bed.  Cues for UE/LE placement    Balance                                   ADL Overall ADL's : Needs assistance/impaired     Grooming: Supervision/safety;Standing;Oral care;Wash/dry hands       Lower Body Bathing: Minimal assistance;Sit to/from stand       Lower Body Dressing: Moderate assistance;Sit to/from stand   Toilet Transfer: Min guard;Ambulation;BSC;RW   Toileting- Architect and Hygiene: Min guard;Sit to/from stand         General ADL Comments: ambulated to bathroom and performed grooming.  Pt fatiqued at end of session and returned to bed      Vision                     Perception     Praxis      Cognition   Behavior During Therapy: [employer] for tasks assessed/performed Overall Cognitive Status: Within Functional Limits for tasks assessed                       Extremity/Trunk Assessment  Upper Extremity Assessment Upper Extremity Assessment: Overall WFL for  tasks assessed            Exercises    Shoulder Instructions       General Comments      Pertinent Vitals/ Pain       Pain Assessment: 0-10 Pain Score: 5  Pain Location: R knee Pain Descriptors / Indicators: Sore Pain Intervention(s): Limited activity within patient's tolerance;Monitored during session;Premedicated before session;Repositioned  Home Living Family/patient expects to be discharged to:: Private residence Living Arrangements: Spouse/significant other                 Bathroom Shower/Tub: Producer, television/film/video: Handicapped height     Home Equipment: Shower seat;Bedside commode          Prior Functioning/Environment Level of Independence: Independent            Frequency  Min 2X/week        Progress Toward Goals  OT Goals(current goals can now be found in the care plan section)     Acute Rehab OT Goals Patient Stated Goal: return to prior activities with less pain OT Goal Formulation: With patient Time For Goal Achievement: 09/28/16 Potential to Achieve Goals: Good ADL Goals Pt Will Transfer to Toilet: with supervision;bedside  commode;ambulating Pt Will Perform Tub/Shower Transfer: Shower transfer;with min guard assist;ambulating;3 in 1;shower seat  Plan      Co-evaluation                 End of Session     Activity Tolerance Patient tolerated treatment well   Patient Left in bed;with call bell/phone within reach;with bed alarm set   Nurse Communication          Time: 1610-9604 OT Time Calculation (min): 18 min  Charges: OT General Charges $OT Visit: 1 Procedure OT Evaluation $OT Eval Low Complexity: 1 Procedure  Gwendolyn Morrow 09/25/2016, 1:02 PM Gwendolyn Morrow, OTR/L [phone] 09/25/2016

## 2016-09-25 NOTE — Progress Notes (Signed)
Patient ID: Gwendolyn Morrow, female   DOB: [birth year], 76 y.o.   MRN: [id number] Subjective: 1 Day Post-Op Procedure(s) (LRB): TOTAL KNEE ARTHROPLASTY (Right)    Patient reports pain as moderate.  Not much sleep last night.  Objective:   VITALS:   Vitals:   09/25/16 0603 09/25/16 0924  BP: 111/60 (!) 143/66  Pulse: 60 68  Resp: 16 16  Temp: 97.8 F (36.6 C) 98.2 F (36.8 C)    Neurovascular intact Incision: dressing C/D/I , right knee ACE wrap intact and dry  LABS  Recent Labs  09/25/16 0506  HGB 10.7*  HCT 31.4*  WBC 14.8*  PLT 203     Recent Labs  09/25/16 0506  NA 137  K 4.0  BUN 14  CREATININE 0.77  GLUCOSE 124*    No results for input(s): LABPT, INR in the last 72 hours.   Assessment/Plan: 1 Day Post-Op Procedure(s) (LRB): TOTAL KNEE ARTHROPLASTY (Right)   Advance diet Up with therapy Plan for discharge tomorrow  Needs more inpatient PT prior to safe discharge to home due to need for independence Continue to monitor pain needs

## 2016-09-26 LAB — BASIC METABOLIC PANEL
ANION GAP: 7 (ref 5–15)
BUN: 21 mg/dL — ABNORMAL HIGH (ref 6–20)
CHLORIDE: 103 mmol/L (ref 101–111)
CO2: 27 mmol/L (ref 22–32)
Calcium: 8.8 mg/dL — ABNORMAL LOW (ref 8.9–10.3)
Creatinine, Ser: 0.97 mg/dL (ref 0.44–1.00)
GFR calc non Af Amer: 57 mL/min — ABNORMAL LOW (ref >60)
Glucose, Bld: 100 mg/dL — ABNORMAL HIGH (ref 65–99)
Potassium: 3.6 mmol/L (ref 3.5–5.1)
Sodium: 137 mmol/L (ref 135–145)

## 2016-09-26 LAB — CBC
HEMATOCRIT: 29.8 % — AB (ref 36.0–46.0)
Hemoglobin: 10 g/dL — ABNORMAL LOW (ref 12.0–15.0)
MCH: 29.9 pg (ref 26.0–34.0)
MCHC: 33.6 g/dL (ref 30.0–36.0)
MCV: 89.2 fL (ref 78.0–100.0)
Platelets: 197 10*3/uL (ref 150–400)
RBC: 3.34 MIL/uL — ABNORMAL LOW (ref 3.87–5.11)
RDW: 12.4 % (ref 11.5–15.5)
WBC: 14 10*3/uL — AB (ref 4.0–10.5)

## 2016-09-26 MED ORDER — FERROUS SULFATE 325 (65 FE) MG PO TABS: 325.0000 mg | ORAL_TABLET | Freq: Three times a day (TID) | ORAL | 3 refills | Status: DC

## 2016-09-26 MED ORDER — POLYETHYLENE GLYCOL 3350 17 G PO PACK: 17.0000 g | PACK | Freq: Two times a day (BID) | ORAL | 0 refills | Status: DC

## 2016-09-26 MED ORDER — DOCUSATE SODIUM 100 MG PO CAPS: 100.0000 mg | ORAL_CAPSULE | Freq: Two times a day (BID) | ORAL | 0 refills | Status: DC

## 2016-09-26 MED ORDER — METHOCARBAMOL 500 MG PO TABS: 500.0000 mg | ORAL_TABLET | Freq: Four times a day (QID) | ORAL | 0 refills | Status: DC | PRN

## 2016-09-26 MED ORDER — HYDROCODONE-ACETAMINOPHEN 7.5-325 MG PO TABS: 1.0000 | ORAL_TABLET | ORAL | 0 refills | Status: DC | PRN

## 2016-09-26 MED ORDER — ASPIRIN 81 MG PO CHEW: 81.0000 mg | CHEWABLE_TABLET | Freq: Two times a day (BID) | ORAL | 0 refills | Status: AC

## 2016-09-26 NOTE — Discharge Instructions (Signed)

## 2016-09-26 NOTE — Progress Notes (Signed)
Physical Therapy Treatment Patient Details Name: Gwendolyn Morrow MRN: [id number] DOB: 14-Oct-1940 Today's Date: 09/26/2016    History of Present Illness s/p R TKA    PT Comments    Pt mobilizing well but with incr pain(had all meds prior to session, educated on ice at home and placed ice to knee at this time, deferred ex's for now and will see for second session later today  Follow Up Recommendations  Outpatient PT;Supervision - Intermittent     Equipment Recommendations  Rolling walker with 5" wheels    Recommendations for Other Services       Precautions / Restrictions Precautions Precautions: Fall;Knee Restrictions Weight Bearing Restrictions: No Other Position/Activity Restrictions: WBAT    Mobility  Bed Mobility Overal bed mobility: Needs Assistance Bed Mobility: Supine to Sit;Sit to Supine     Supine to sit: Min assist Sit to supine: Min assist   General bed mobility comments: incr time, assist with RLE d/t pain  Transfers Overall transfer level: Needs assistance Equipment used: Rolling walker (2 wheeled) Transfers: Sit to/from Stand Sit to Stand: Min guard         General transfer comment: cues for hand placement  Ambulation/Gait Ambulation/Gait assistance: Min guard Ambulation Distance (Feet): 110 Feet     Gait velocity: decr   General Gait Details: cues for posture and step length, position from RW, gait progression (step through)   Stairs Stairs: Yes   Stair Management: One rail Right;One rail Left;Step to pattern;Sideways Number of Stairs: 3 General stair comments: cues for sequence  Wheelchair Mobility    Modified Rankin (Stroke Patients Only)       Balance                                    Cognition Arousal/Alertness: Awake/alert Behavior During Therapy: WFL for tasks assessed/performed Overall Cognitive Status: Within Functional Limits for tasks assessed                      Exercises Total Joint  Exercises Ankle Circles/Pumps:  (deferred d/t pain)    General Comments        Pertinent Vitals/Pain Pain Assessment: 0-10 Pain Score: 7  Pain Location: R knee Pain Descriptors / Indicators: Tingling;Sore Pain Intervention(s): Limited activity within patient's tolerance;Monitored during session;Premedicated before session;Ice applied    Home Living                      Prior Function            PT Goals (current goals can now be found in the care plan section) Acute Rehab PT Goals Patient Stated Goal: return to prior activities with less pain PT Goal Formulation: With patient Time For Goal Achievement: 09/27/16 Potential to Achieve Goals: Good Progress towards PT goals: Progressing toward goals    Frequency    7X/week      PT Plan Current plan remains appropriate    Co-evaluation             End of Session Equipment Utilized During Treatment: Gait belt Activity Tolerance: Patient tolerated treatment well Patient left: with call bell/phone within reach;in bed     Time: 0937-1002 PT Time Calculation (min) (ACUTE ONLY): 25 min  Charges:  $Gait Training: 23-37 mins                    G Codes:  [facility] 09/26/2016, 10:18 AM

## 2016-09-26 NOTE — Progress Notes (Addendum)
     Subjective: 2 Days Post-Op Procedure(s) (LRB): TOTAL KNEE ARTHROPLASTY (Right)   Patient reports pain as moderate, pain controlled. Feels that she probably has increased pain because of pushing herself with PT yesterday.  No events throughout the night.  Looking forward to progressing with PT.  Ready to be discharged home if she does well with PT.   Objective:   VITALS:   Vitals:   09/25/16 2126 09/26/16 0654  BP: 124/61 140/68  Pulse: (!) 57 69  Resp: 16 16  Temp: 98 F (36.7 C) 97.4 F (36.3 C)    Dorsiflexion/Plantar flexion intact Incision: dressing C/D/I No cellulitis present Compartment soft  LABS  Recent Labs  09/25/16 0506 09/26/16 0459  HGB 10.7* 10.0*  HCT 31.4* 29.8*  WBC 14.8* 14.0*  PLT 203 197     Recent Labs  09/25/16 0506 09/26/16 0459  NA 137 137  K 4.0 3.6  BUN 14 21*  CREATININE 0.77 0.97  GLUCOSE 124* 100*     Assessment/Plan: 2 Days Post-Op Procedure(s) (LRB): TOTAL KNEE ARTHROPLASTY (Right) Foley cath d/c'ed Up with therapy Discharge home Follow up in 2 weeks at [facility]. Follow up with OLIN,Disney Ruggiero D in 2 weeks.  Contact information:  [facility] [address], [address] [place]   PAC  09/26/2016, 9:31 AM

## 2016-09-26 NOTE — Progress Notes (Signed)
Occupational Therapy Treatment Patient Details Name: BRENDALEE BINION MRN: [id number] DOB: [birth year] Today's Date: 09/26/2016    History of present illness s/p R TKA   OT comments  All education completed this session  Follow Up Recommendations  No OT follow up;Supervision/Assistance - 24 hour    Equipment Recommendations  None recommended by OT    Recommendations for Other Services      Precautions / Restrictions Precautions Precautions: Fall;Knee Restrictions Weight Bearing Restrictions: No Other Position/Activity Restrictions: WBAT       Mobility Bed Mobility Overal bed mobility: Needs Assistance Bed Mobility: Supine to Sit;Sit to Supine     Supine to sit: Min assist Sit to supine: Min assist   General bed mobility comments: light assistance due to pain  Transfers Overall transfer level: Needs assistance Equipment used: Rolling walker (2 wheeled) Transfers: Sit to/from Stand Sit to Stand: Supervision         General transfer comment: cues for hand placement    Balance                                   ADL                           Toilet Transfer: Min guard;Ambulation;BSC;RW   Toileting- Clothing Manipulation and Hygiene: Supervision/safety;Sit to/from stand   Tub/ Shower Transfer: Walk-in shower;Min guard;Ambulation;3 in 1     General ADL Comments: performed bathroom transfers:  cues for safety with RW going through tight spaces as well as keeping it in front of her for turns.        Vision                     Perception     Praxis      Cognition   Behavior During Therapy: WFL for tasks assessed/performed Overall Cognitive Status: Within Functional Limits for tasks assessed                       Extremity/Trunk Assessment               Exercises    Shoulder Instructions       General Comments      Pertinent Vitals/ Pain       Pain Assessment: 0-10 Pain Score: 6  Pain Location: R  knee Pain Descriptors / Indicators: Aching;Sore Pain Intervention(s): Limited activity within patient's tolerance;Monitored during session;Premedicated before session;Repositioned;Ice applied  Home Living                                          Prior Functioning/Environment              Frequency           Progress Toward Goals  OT Goals(current goals can now be found in the care plan section)  Progress towards OT goals: Progressing toward goals (no further OT is needed at this time)  Acute Rehab OT Goals Patient Stated Goal: return to prior activities with less pain  Plan      Co-evaluation                 End of Session     Activity Tolerance Patient tolerated treatment well   Patient Left in bed;with  call bell/phone within reach;with bed alarm set   Nurse Communication          Time: [phone] OT Time Calculation (min): 14 min  Charges: OT General Charges $OT Visit: 1 Procedure OT Treatments $Self Care/Home Management : 8-22 mins  Leonor Darnell 09/26/2016, 1:30 PM Marica Otter, OTR/L [phone] 09/26/2016

## 2016-09-26 NOTE — Progress Notes (Signed)
   09/26/16 1400  PT Visit Information  Last PT Received On 09/26/16 Pt doing well; encouraged her to perform TKA ex's later today and tomorrow prior to going to OPPT on Friday  Assistance Needed +1  History of Present Illness s/p R TKA  Subjective Data  Patient Stated Goal return to prior activities with less pain  Precautions  Precautions Fall;Knee  Restrictions  Other Position/Activity Restrictions WBAT  Pain Assessment  Pain Assessment 0-10  Pain Score 5  Pain Location R knee  Pain Descriptors / Indicators Aching;Sore  Pain Intervention(s) Limited activity within patient's tolerance;Monitored during session;Premedicated before session;Repositioned  Cognition  Arousal/Alertness Awake/alert  Behavior During Therapy WFL for tasks assessed/performed  Overall Cognitive Status Within Functional Limits for tasks assessed  Bed Mobility  Overal bed mobility Needs Assistance  Bed Mobility Supine to Sit;Sit to Supine  Supine to sit Supervision  Sit to supine Supervision  General bed mobility comments pt able to self assist, extra time  Transfers  Overall transfer level Needs assistance  Equipment used Rolling walker (2 wheeled)  Transfers Sit to/from Stand  Sit to Stand Supervision;Modified independent (Device/Increase time)  General transfer comment for safety, repeated x 3 to allow pt to don LB garments  Ambulation/Gait  Ambulation/Gait assistance Min guard  Ambulation Distance (Feet) 120 Feet (10')  Assistive device Rolling walker (2 wheeled)  Gait Pattern/deviations Step-to pattern;Step-through pattern  General Gait Details cues for posture and step length, position from RW, gait progression (step through)  Gait velocity pt plans to use her husband's standard walker, discussed safety and use of such  Total Joint Exercises  Ankle Circles/Pumps AROM;Both;10 reps  PT - End of Session  Activity Tolerance Patient tolerated treatment well  Patient left with call bell/phone within  reach;in bed  PT - Assessment/Plan  PT Plan Current plan remains appropriate  PT Frequency (ACUTE ONLY) 7X/week  Follow Up Recommendations Outpatient PT;Supervision - Intermittent  PT equipment Rolling walker with 5" wheels  PT Goal Progression  Progress towards PT goals Progressing toward goals  Acute Rehab PT Goals  PT Goal Formulation With patient  Time For Goal Achievement 09/27/16  Potential to Achieve Goals Good  PT Time Calculation  PT Start Time (ACUTE ONLY) 1355  PT Stop Time (ACUTE ONLY) 1417  PT Time Calculation (min) (ACUTE ONLY) 22 min  PT General Charges  $$ ACUTE PT VISIT 1 Procedure  PT Treatments  $Gait Training 8-22 mins

## 2016-09-28 DIAGNOSIS — M25661 Stiffness of right knee, not elsewhere classified: Secondary | ICD-10-CM | POA: Diagnosis not present

## 2016-10-01 DIAGNOSIS — M25661 Stiffness of right knee, not elsewhere classified: Secondary | ICD-10-CM | POA: Diagnosis not present

## 2016-10-01 NOTE — Discharge Summary (Signed)
Physician Discharge Summary  Patient ID: Gwendolyn Morrow MRN: [id number] DOB/AGE: 05-09-1941 76 y.o.  Admit date: 09/24/2016 Discharge date: 09/26/2016   Procedures:  Procedure(s) (LRB): TOTAL KNEE ARTHROPLASTY (Right)  Attending Physician:  Dr. Durene Romans   Admission Diagnoses:   Right knee primary OA / pain  Discharge Diagnoses:  Principal Problem:   S/P right TKA Active Problems:   S/P knee replacement  Past Medical History:  Diagnosis Date  . Arthritis   . Complication of anesthesia   . GERD (gastroesophageal reflux disease)   . H/O seasonal allergies   . Headache   . Hypertension   . PONV (postoperative nausea and vomiting)     HPI:    Gwendolyn Morrow, 76 y.o. female, has a history of pain and functional disability in the right knee due to arthritis and has failed non-surgical conservative treatments for greater than 12 weeks to include NSAID's and/or analgesics, corticosteriod injections, viscosupplementation injections and activity modification.  Onset of symptoms was gradual, starting >10 years ago with gradually worsening course since that time. The patient noted no past surgery on the right knee(s).  Patient currently rates pain in the right knee(s) at 8 out of 10 with activity. Patient has night pain, worsening of pain with activity and weight bearing, pain that interferes with activities of daily living, pain with passive range of motion, crepitus and joint swelling.  Patient has evidence of periarticular osteophytes and joint space narrowing by imaging studies.  There is no active infection.   Risks, benefits and expectations were discussed with the patient.  Risks including but not limited to the risk of anesthesia, blood clots, nerve damage, blood vessel damage, failure of the prosthesis, infection and up to and including death.  Patient understand the risks, benefits and expectations and wishes to proceed with surgery.   PCP: Darrow Bussing, MD   Discharged Condition:  good  Hospital Course:  Patient underwent the above stated procedure on 09/24/2016. Patient tolerated the procedure well and brought to the recovery room in good condition and subsequently to the floor.  POD #1 BP: 143/66 ; Pulse: 68 ; Temp: 98.2 F (36.8 C) ; Resp: 16 Patient reports pain as moderate.  Not much sleep last night. Neurovascular intact Incision: dressing C/D/I , right knee ACE wrap intact and dry.  LABS  Basename    HGB     10.7  HCT     31.4   POD #2  BP: 140/68 ; Pulse: 69 ; Temp: 97.4 F (36.3 C) ; Resp: 16 Patient reports pain as moderate, pain controlled. Feels that she probably has increased pain because of pushing herself with PT yesterday.  No events throughout the night.  Looking forward to progressing with PT.  Ready to be discharged home. Dorsiflexion/plantar flexion intact, incision: dressing C/D/I, no cellulitis present and compartment soft.   LABS  Basename    HGB     10.0  HCT     29.8    Discharge Exam: General appearance: alert, cooperative and no distress Extremities: Homans sign is negative, no sign of DVT, no edema, redness or tenderness in the calves or thighs and no ulcers, gangrene or trophic changes  Disposition: Home with follow up in 2 weeks   Follow-up Information    Shelda Pal, MD. Schedule an appointment as soon as possible for a visit in 2 week(s).   Specialty:  Orthopedic Surgery Contact information: [address] [place] [address] [phone]  Discharge Instructions    Call MD / Call 911    Complete by:  As directed    If you experience chest pain or shortness of breath, CALL 911 and be transported to the hospital emergency room.  If you develope a fever above 101 F, pus (white drainage) or increased drainage or redness at the wound, or calf pain, call your surgeon's office.   Change dressing    Complete by:  As directed    Maintain surgical dressing until follow up in the clinic. If  the edges start to pull up, may reinforce with tape. If the dressing is no longer working, may remove and cover with gauze and tape, but must keep the area dry and clean.  Call with any questions or concerns.   Constipation Prevention    Complete by:  As directed    Drink plenty of fluids.  Prune juice may be helpful.  You may use a stool softener, such as Colace (over the counter) 100 mg twice a day.  Use MiraLax (over the counter) for constipation as needed.   Diet - low sodium heart healthy    Complete by:  As directed    Discharge instructions    Complete by:  As directed    Maintain surgical dressing until follow up in the clinic. If the edges start to pull up, may reinforce with tape. If the dressing is no longer working, may remove and cover with gauze and tape, but must keep the area dry and clean.  Follow up in 2 weeks at [facility]. Call with any questions or concerns.   Increase activity slowly as tolerated    Complete by:  As directed    Weight bearing as tolerated with assist device (walker, cane, etc) as directed, use it as long as suggested by your surgeon or therapist, typically at least 4-6 weeks.   TED hose    Complete by:  As directed    Use stockings (TED hose) for 2 weeks on both leg(s).  You may remove them at night for sleeping.      Allergies as of 09/26/2016      Reactions   Penicillins Anaphylaxis   Has patient had a PCN reaction causing immediate rash, facial/tongue/throat swelling, SOB or lightheadedness with hypotension: Yes Has patient had a PCN reaction causing severe rash involving mucus membranes or skin necrosis: No Has patient had a PCN reaction that required hospitalization Yes Has patient had a PCN reaction occurring within the last 10 years: No If all of the above answers are "NO", then may proceed with Cephalosporin use.   Excedrin Extra Strength [asa-apap-caff Buffered] Hives   Tyloxapol Hives      Medication List    STOP taking  these medications   naproxen sodium 220 MG tablet Commonly known as:  ANAPROX     TAKE these medications   acyclovir 400 MG tablet Commonly known as:  ZOVIRAX Take 400 mg by mouth 3 (three) times daily as needed (fever blisters).   amLODipine 10 MG tablet Commonly known as:  NORVASC Take 10 mg by mouth daily.   aspirin 81 MG chewable tablet Chew 1 tablet (81 mg total) by mouth 2 (two) times daily. Take for 4 weeks, then resume regular dose. What changed:  when to take this  additional instructions   atorvastatin 20 MG tablet Commonly known as:  LIPITOR Take 20 mg by mouth daily at 6 PM.   BIOTIN PO Take 15 mg by mouth  daily.   calcium carbonate 1500 (600 Ca) MG Tabs tablet Commonly known as:  OSCAL Take 600 mg of elemental calcium by mouth every evening.   docusate sodium 100 MG capsule Commonly known as:  COLACE Take 1 capsule (100 mg total) by mouth 2 (two) times daily.   ferrous sulfate 325 (65 FE) MG tablet Take 1 tablet (325 mg total) by mouth 3 (three) times daily after meals.   fexofenadine 180 MG tablet Commonly known as:  ALLEGRA Take 180 mg by mouth daily.   HYDROcodone-acetaminophen 7.5-325 MG tablet Commonly known as:  NORCO Take 1-2 tablets by mouth every 4 (four) hours as needed for moderate pain.   methocarbamol 500 MG tablet Commonly known as:  ROBAXIN Take 1 tablet (500 mg total) by mouth every 6 (six) hours as needed for muscle spasms.   multivitamin with minerals Tabs tablet Take 1 tablet by mouth every evening.   OMEGA-3 KRILL OIL PO Take 350 mg by mouth every evening.   omeprazole 20 MG capsule Commonly known as:  PRILOSEC Take 20 mg by mouth daily.   polyethylene glycol packet Commonly known as:  MIRALAX / GLYCOLAX Take 17 g by mouth 2 (two) times daily.   potassium chloride 20 MEQ packet Commonly known as:  KLOR-CON Take 20 mEq by mouth daily.   triamterene-hydrochlorothiazide 37.5-25 MG tablet Commonly known as:   MAXZIDE-25 Take 1 tablet by mouth daily.   Turmeric 500 MG Caps Take 500 mg by mouth every evening.   vitamin B-12 500 MCG tablet Commonly known as:  CYANOCOBALAMIN Take 500 mcg by mouth every other day.   vitamin C 1000 MG tablet Take 1,000 mg by mouth every evening.        Signed: Anastasio Auerbach. Zawadi Aplin   PA-C  10/01/2016, 9:54 AM

## 2016-10-05 DIAGNOSIS — M25661 Stiffness of right knee, not elsewhere classified: Secondary | ICD-10-CM | POA: Diagnosis not present

## 2016-10-08 DIAGNOSIS — M25661 Stiffness of right knee, not elsewhere classified: Secondary | ICD-10-CM | POA: Diagnosis not present

## 2016-10-10 DIAGNOSIS — Z471 Aftercare following joint replacement surgery: Secondary | ICD-10-CM | POA: Diagnosis not present

## 2016-10-10 DIAGNOSIS — M25661 Stiffness of right knee, not elsewhere classified: Secondary | ICD-10-CM | POA: Diagnosis not present

## 2016-10-10 DIAGNOSIS — Z96651 Presence of right artificial knee joint: Secondary | ICD-10-CM | POA: Diagnosis not present

## 2016-10-14 DIAGNOSIS — N390 Urinary tract infection, site not specified: Secondary | ICD-10-CM | POA: Diagnosis not present

## 2016-10-19 DIAGNOSIS — M25661 Stiffness of right knee, not elsewhere classified: Secondary | ICD-10-CM | POA: Diagnosis not present

## 2016-10-22 DIAGNOSIS — R399 Unspecified symptoms and signs involving the genitourinary system: Secondary | ICD-10-CM | POA: Diagnosis not present

## 2016-10-22 DIAGNOSIS — H6123 Impacted cerumen, bilateral: Secondary | ICD-10-CM | POA: Diagnosis not present

## 2016-10-22 DIAGNOSIS — M25661 Stiffness of right knee, not elsewhere classified: Secondary | ICD-10-CM | POA: Diagnosis not present

## 2016-10-24 DIAGNOSIS — M25661 Stiffness of right knee, not elsewhere classified: Secondary | ICD-10-CM | POA: Diagnosis not present

## 2016-10-26 DIAGNOSIS — M25661 Stiffness of right knee, not elsewhere classified: Secondary | ICD-10-CM | POA: Diagnosis not present

## 2016-10-30 DIAGNOSIS — M25661 Stiffness of right knee, not elsewhere classified: Secondary | ICD-10-CM | POA: Diagnosis not present

## 2016-11-01 DIAGNOSIS — M25661 Stiffness of right knee, not elsewhere classified: Secondary | ICD-10-CM | POA: Diagnosis not present

## 2016-11-06 DIAGNOSIS — M25661 Stiffness of right knee, not elsewhere classified: Secondary | ICD-10-CM | POA: Diagnosis not present

## 2016-11-07 DIAGNOSIS — Z96651 Presence of right artificial knee joint: Secondary | ICD-10-CM | POA: Diagnosis not present

## 2016-11-07 DIAGNOSIS — Z471 Aftercare following joint replacement surgery: Secondary | ICD-10-CM | POA: Diagnosis not present

## 2016-11-08 DIAGNOSIS — M25661 Stiffness of right knee, not elsewhere classified: Secondary | ICD-10-CM | POA: Diagnosis not present

## 2016-11-14 DIAGNOSIS — M25661 Stiffness of right knee, not elsewhere classified: Secondary | ICD-10-CM | POA: Diagnosis not present

## 2016-11-16 DIAGNOSIS — M25661 Stiffness of right knee, not elsewhere classified: Secondary | ICD-10-CM | POA: Diagnosis not present

## 2016-11-19 DIAGNOSIS — M25661 Stiffness of right knee, not elsewhere classified: Secondary | ICD-10-CM | POA: Diagnosis not present

## 2016-11-21 DIAGNOSIS — M25661 Stiffness of right knee, not elsewhere classified: Secondary | ICD-10-CM | POA: Diagnosis not present

## 2016-11-27 DIAGNOSIS — M25661 Stiffness of right knee, not elsewhere classified: Secondary | ICD-10-CM | POA: Diagnosis not present

## 2016-11-28 ENCOUNTER — Encounter: Payer: Self-pay | Admitting: Physical Therapy

## 2016-11-28 ENCOUNTER — Ambulatory Visit: Payer: Medicare Other | Attending: Family Medicine | Admitting: Physical Therapy

## 2016-11-28 DIAGNOSIS — R42 Dizziness and giddiness: Secondary | ICD-10-CM | POA: Insufficient documentation

## 2016-11-28 DIAGNOSIS — R2681 Unsteadiness on feet: Secondary | ICD-10-CM | POA: Diagnosis not present

## 2016-11-28 NOTE — Therapy (Signed)
[facility] [address] [place], [place], [address] Phone: [phone]   Fax:  [phone]  Physical Therapy Evaluation  Patient Details  Name: Gwendolyn Morrow MRN: [id number] Date of Birth: 1941-04-11 Referring Provider: Lujean Amel, MD  Encounter Date: 11/28/2016      PT End of Session - 11/28/16 2015    Visit Number 1   Number of Visits 5   Date for PT Re-Evaluation 12/28/16   Authorization Type Medicare Primary, BCBS secondary-G code and progress note every 10th visit   PT Start Time 1102   PT Stop Time 1145   PT Time Calculation (min) 43 min   Activity Tolerance Patient tolerated treatment well   Behavior During Therapy [facility] for tasks assessed/performed      Past Medical History:  Diagnosis Date  . Arthritis   . Complication of anesthesia   . GERD (gastroesophageal reflux disease)   . H/O seasonal allergies   . Headache   . Hypertension   . PONV (postoperative nausea and vomiting)     Past Surgical History:  Procedure Laterality Date  . ABDOMINAL HYSTERECTOMY    . APPENDECTOMY     removed during C-section  . BREAST SURGERY     right breast biopsy- benign  . CESAREAN SECTION    . HAND SURGERY     bilateral thumb joints replaced  . TOTAL KNEE ARTHROPLASTY Right 09/24/2016   Procedure: TOTAL KNEE ARTHROPLASTY;  Surgeon: Paralee Cancel, MD;  Location: [facility];  Service: Orthopedics;  Laterality: Right;  . TUBAL LIGATION      There were no vitals filed for this visit.       Subjective Assessment - 11/28/16 1109    Subjective Pt presents to OPPT with vertigo.  Pt with h/o vertigo/BPPV 4 years ago, successfully treated with complete resolution of symptoms.  One month ago, pt experienced an acute episode of severe room spinning vertigo when she first woke up lasting >1 minute; required assistance to sit up.  Pt did vomit and then spent the rest of the day in bed.  By the next day pt was able to ambulate  without vertigo but felt off balance.  A few days later pt had second episode after sitting up in bed; reports vertigo less severe and resolved within a few minutes.  Pt also reported her left ear was "plugged up"; irrigation performed by referring physician.   Pertinent History h/o BPPV   Limitations Other (comment)  any movement when vertigo begins   Patient Stated Goals To get rid of the dizziness and prevent a fall   Currently in Pain? Yes  R knee-not being addressed with this referral            [facility] PT Assessment - 11/28/16 1115      Assessment   Medical Diagnosis vertigo   Referring Provider Dibas Koirala, MD   Onset Date/Surgical Date 11/14/16   Next MD Visit PRN   Prior Therapy yes for BPPV, currently receiving PT s/p R TKA     Balance Screen   Has the patient fallen in the past 6 months No   Has the patient had a decrease in activity level because of a fear of falling?  No   Is the patient reluctant to leave their home because of a fear of falling?  No     Prior Function   Level of Independence Independent     Observation/Other Assessments   Focus on Therapeutic Outcomes (  FOTO)  12 (88% limited; 41% predicted limited)   Other Surveys  Other Surveys   Dizziness Handicap Inventory [facility])  54            Vestibular Assessment - 11/28/16 1117      Vestibular Assessment   General Observation no evidence of imbalance today     Symptom Behavior   Type of Dizziness Spinning   Frequency of Dizziness 2 acute episodes   Duration of Dizziness minutes > one day   Aggravating Factors Spontaneous onset   Relieving Factors Comments  time     Occulomotor Exam   Occulomotor Alignment Normal   Spontaneous Absent   Gaze-induced Absent   Smooth Pursuits Intact   Saccades Intact   Comment convergence intact     Vestibulo-Occular Reflex   VOR 1 Head Only (x 1 viewing) saccadic   VOR to Slow Head Movement Normal   VOR Cancellation Normal   Comment HIT: + to L      Visual Acuity   Static 6   Dynamic 3     Positional Testing   Dix-Hallpike Dix-Hallpike Right;Dix-Hallpike Left   Horizontal Canal Testing Horizontal Canal Right;Horizontal Canal Left     Dix-Hallpike Right   Dix-Hallpike Right Duration 0   Dix-Hallpike Right Symptoms No nystagmus     Dix-Hallpike Left   Dix-Hallpike Left Duration 0   Dix-Hallpike Left Symptoms No nystagmus     Horizontal Canal Right   Horizontal Canal Right Duration 0   Horizontal Canal Right Symptoms Normal     Horizontal Canal Left   Horizontal Canal Left Duration 0   Horizontal Canal Left Symptoms Normal                Vestibular Treatment/Exercise - 11/28/16 1145      Vestibular Treatment/Exercise   Vestibular Treatment Provided Gaze   Gaze Exercises X1 Viewing Horizontal;X1 Viewing Vertical     X1 Viewing Horizontal   Foot Position feet apart   Reps 1   Comments 30-45 seconds     X1 Viewing Vertical   Foot Position feet apart   Reps 1   Comments 30-45 seconds               PT Education - 11/28/16 2013    Education provided Yes   Education Details clinical findings, goals and POC, HEP   Person(s) Educated Patient   Methods Explanation;Demonstration;Handout   Comprehension Verbalized understanding;Returned demonstration             PT Long Term Goals - 11/28/16 2021      PT LONG TERM GOAL #1   Title (TARGET DATE FOR ALL LTG 12/28/16) Pt will report resolution of dizziness with [place] decreasing by 18 points.   Baseline 54   Status New     PT LONG TERM GOAL #2   Title Pt will be independent with vestibular HEP   Status New     PT LONG TERM GOAL #3   Title FGA goal TBD     PT LONG TERM GOAL #4   Title Pt will demonstrate an improvement in gaze stability as indicated by a DVA 2 line difference   Baseline 3 line difference   Status New               Plan - 11/28/16 2016    Clinical Impression Statement Pt is a 76 year old female presenting to OPPT neuro  for low complexity PT evaluation for acute episode of vertigo. Pt's  PMH significant for the following: BPPV, R TKA, UTI with antibiotics, HTN, Migraines, OA, osteopenia. The following deficits were noted during pt's exam: L vestibular hypofunction and impaired gaze stability. Pt would benefit from skilled PT to address these impairments and functional limitations to maximize functional mobility independence and reduce falls risk.   Rehab Potential Excellent   PT Frequency 1x / week   PT Duration 4 weeks   PT Treatment/Interventions ADLs/Self Care Home Management;Canalith Repostioning;Therapeutic activities;Neuromuscular re-education;Balance training;Patient/family education;Vestibular;Therapeutic exercise   PT Next Visit Plan Review x 1 viewing HEP-progress if able; assess FGA and set LTG   Consulted and Agree with Plan of Care Patient      Patient will benefit from skilled therapeutic intervention in order to improve the following deficits and impairments:  Decreased balance, Dizziness  Visit Diagnosis: Dizziness and giddiness  Unsteadiness on feet      G-Codes - Dec 22, 2016 January 08, 2027    Functional Assessment Tool Used (Outpatient Only) DHI   Functional Limitation Mobility: Walking and moving around   Mobility: Walking and Moving Around Current Status [phone]) At least 40 percent but less than 60 percent impaired, limited or restricted   Mobility: Walking and Moving Around Goal Status [phone]) 0 percent impaired, limited or restricted       Problem List Patient Active Problem List   Diagnosis Date Noted  . S/P right TKA 09/24/2016  . S/P knee replacement 09/24/2016   Raylene Everts, PT, DPT 22-Dec-2016    8:31 PM    [facility] [address] [place], [place], [address] Phone: [phone]   Fax:  [phone]  Name: ALAYASIA BREEDING MRN: [id number] Date of Birth: 04/05/1941

## 2016-11-28 NOTE — Patient Instructions (Signed)
Gaze Stabilization - Tip Card  1.Target must remain in focus, not blurry, and appear stationary while head is in motion. 2.Perform exercises with small head movements (45 to either side of midline). 3.Increase speed of head motion so long as target is in focus. 4.If you wear eyeglasses, be sure you can see target through lens (therapist will give specific instructions for bifocal / progressive lenses). 5.These exercises may provoke dizziness or nausea. Work through these symptoms. If too dizzy, slow head movement slightly. Rest between each exercise. 6.Exercises demand concentration; avoid distractions. 7.For safety, perform standing exercises close to a counter, wall, corner, or next to someone.  Copyright  VHI. All rights reserved.   Gaze Stabilization - Standing Feet Apart   Feet shoulder width apart, keeping eyes on target on wall 3 feet away, tilt head down slightly and move head side to side for 30 seconds. Repeat while moving head up and down for 30 seconds. *Work up to tolerating 60 seconds, as able. Do 2-3 sessions per day.   Copyright  VHI. All rights reserved.    

## 2016-12-04 DIAGNOSIS — M25661 Stiffness of right knee, not elsewhere classified: Secondary | ICD-10-CM | POA: Diagnosis not present

## 2016-12-06 DIAGNOSIS — M25661 Stiffness of right knee, not elsewhere classified: Secondary | ICD-10-CM | POA: Diagnosis not present

## 2016-12-07 ENCOUNTER — Encounter: Payer: Self-pay | Admitting: Physical Therapy

## 2016-12-07 ENCOUNTER — Ambulatory Visit: Payer: Medicare Other | Admitting: Physical Therapy

## 2016-12-07 DIAGNOSIS — R2681 Unsteadiness on feet: Secondary | ICD-10-CM | POA: Diagnosis not present

## 2016-12-07 DIAGNOSIS — R42 Dizziness and giddiness: Secondary | ICD-10-CM

## 2016-12-07 NOTE — Patient Instructions (Signed)
**  For Letter Exercise: stand with feet together, perform for 60 seconds each direction.  2-3 times a day  Balance: Eyes Closed - Bilateral (Varied Surfaces)   Feet Together (Compliant Surface) Head Motion - Eyes Closed    Stand on compliant surface: pillow with feet together. Close eyes and hold x 30 seconds  Repeat 2 times per session. Do 2-3 sessions per day.  Copyright  VHI. All rights reserved.  Feet Partial Heel-Toe (Compliant Surface) Head Motion - Eyes Closed    Stand on compliant surface: pillow with right foot partially in front of the other. Close eyes and hold x 30 seconds, repeat with left foot forwards. Repeat 2 times per leg. Do 2-3 sessions per day.  Copyright  VHI. All rights reserved.

## 2016-12-07 NOTE — Therapy (Signed)
[facility] [address] [place], [place], [address] Phone: [phone]   Fax:  [phone]  Physical Therapy Treatment  Patient Details  Name: Gwendolyn Morrow MRN: [id number] Date of Birth: [birth year] Referring Provider: Lujean Amel, MD  Encounter Date: 12/07/2016      PT End of Session - 12/07/16 1412    Visit Number 2   Number of Visits 5   Date for PT Re-Evaluation 12/28/16   Authorization Type Medicare Primary, BCBS secondary-G code and progress note every 10th visit   PT Start Time 1326   PT Stop Time 1410   PT Time Calculation (min) 44 min   Activity Tolerance Patient limited by pain  knee pain   Behavior During Therapy [facility] for tasks assessed/performed      Past Medical History:  Diagnosis Date  . Arthritis   . Complication of anesthesia   . GERD (gastroesophageal reflux disease)   . H/O seasonal allergies   . Headache   . Hypertension   . PONV (postoperative nausea and vomiting)     Past Surgical History:  Procedure Laterality Date  . ABDOMINAL HYSTERECTOMY    . APPENDECTOMY     removed during C-section  . BREAST SURGERY     right breast biopsy- benign  . CESAREAN SECTION    . HAND SURGERY     bilateral thumb joints replaced  . TOTAL KNEE ARTHROPLASTY Right 09/24/2016   Procedure: TOTAL KNEE ARTHROPLASTY;  Surgeon: Paralee Cancel, MD;  Location: [facility];  Service: Orthopedics;  Laterality: Right;  . TUBAL LIGATION      There were no vitals filed for this visit.      Subjective Assessment - 12/07/16 1329    Subjective Pt doing well, states she has not had any more dizzy episodes.  Has been performing her exercises at home with mild symptoms.    Pertinent History h/o BPPV   Limitations Other (comment)   Patient Stated Goals To get rid of the dizziness and prevent a fall   Currently in Pain? No/denies            [facility] PT Assessment - 12/07/16 1331      Functional Gait  Assessment    Gait assessed  Yes   Gait Level Surface Walks 20 ft in less than 5.5 sec, no assistive devices, good speed, no evidence for imbalance, normal gait pattern, deviates no more than 6 in outside of the 12 in walkway width.   Change in Gait Speed Able to smoothly change walking speed without loss of balance or gait deviation. Deviate no more than 6 in outside of the 12 in walkway width.   Gait with Horizontal Head Turns Performs head turns smoothly with slight change in gait velocity (eg, minor disruption to smooth gait path), deviates 6-10 in outside 12 in walkway width, or uses an assistive device.   Gait with Vertical Head Turns Performs task with slight change in gait velocity (eg, minor disruption to smooth gait path), deviates 6 - 10 in outside 12 in walkway width or uses assistive device   Gait and Pivot Turn Pivot turns safely within 3 sec and stops quickly with no loss of balance.   Step Over Obstacle Is able to step over 2 stacked shoe boxes taped together (9 in total height) without changing gait speed. No evidence of imbalance.   Gait with Narrow Base of Support Is able to ambulate for 10 steps heel to toe with no staggering.  Gait with Eyes Closed Walks 20 ft, slow speed, abnormal gait pattern, evidence for imbalance, deviates 10-15 in outside 12 in walkway width. Requires more than 9 sec to ambulate 20 ft.   Ambulating Backwards Walks 20 ft, uses assistive device, slower speed, mild gait deviations, deviates 6-10 in outside 12 in walkway width.   Steps Alternating feet, must use rail.  had to use rail due to knee, not imbalance   Total Score 24   FGA comment: 24/30 medium falls risk          Vestibular Treatment/Exercise - 12/07/16 1340      Vestibular Treatment/Exercise   Vestibular Treatment Provided Gaze   Gaze Exercises X1 Viewing Horizontal;X1 Viewing Vertical     X1 Viewing Horizontal   Foot Position feet together   Reps 2   Comments 60 seconds     X1 Viewing Vertical    Foot Position feet together   Reps 2   Comments 60 seconds            Balance Exercises - 12/07/16 1402      Balance Exercises: Standing   Gait with Head Turns Forward;Retro;4 reps;Other (comment)  with eyes following target R/L, up/down, diagonal           PT Education - 12/07/16 1411    Education provided Yes   Education Details HEP progression, addition of corner balance exercises   Person(s) Educated Patient   Methods Explanation;Demonstration;Handout   Comprehension Verbalized understanding;Returned demonstration             PT Long Term Goals - 12/07/16 1422      PT LONG TERM GOAL #1   Title (TARGET DATE FOR ALL LTG 12/28/16) Pt will report resolution of dizziness with [name] decreasing by 18 points.   Baseline 54   Status New     PT LONG TERM GOAL #2   Title Pt will be independent with vestibular HEP   Status New     PT LONG TERM GOAL #3   Title Pt will improve FGA to >/= 28/30 to decrease risk for falls during dynamic gait   Baseline 24/30 on 12/07/16   Status New     PT LONG TERM GOAL #4   Title Pt will demonstrate an improvement in gaze stability as indicated by a DVA 2 line difference   Baseline 3 line difference   Status New               Plan - 12/07/16 1412    Clinical Impression Statement Treatment session with focus on review of HEP, progression of HEP and balance training increasing use of vestibular system.  Pt is making fast progress and was able to tolerate higher level challenges with minimal symptoms or LOB.  Will continue to progress towards targeted LTG.   Rehab Potential Excellent   PT Treatment/Interventions ADLs/Self Care Home Management;Canalith Repostioning;Therapeutic activities;Neuromuscular re-education;Balance training;Patient/family education;Vestibular;Therapeutic exercise   PT Next Visit Plan progress x 1 viewing, balance on compliant surfaces if knee is able to tolerate, progress corner balance to include head turns    Consulted and Agree with Plan of Care Patient      Patient will benefit from skilled therapeutic intervention in order to improve the following deficits and impairments:  Decreased balance, Dizziness  Visit Diagnosis: Dizziness and giddiness  Unsteadiness on feet     Problem List Patient Active Problem List   Diagnosis Date Noted  . S/P right TKA 09/24/2016  . S/P knee replacement 09/24/2016  Raylene Everts, PT, DPT 12/07/16    2:24 PM    [facility] [address] [place], [place], [address] Phone: [phone]   Fax:  [phone]  Name: ZAHLI VETSCH MRN: [id number] Date of Birth: 1940/11/11

## 2016-12-10 ENCOUNTER — Ambulatory Visit: Payer: Medicare Other | Admitting: Rehabilitative and Restorative Service Providers"

## 2016-12-10 DIAGNOSIS — R42 Dizziness and giddiness: Secondary | ICD-10-CM | POA: Diagnosis not present

## 2016-12-10 DIAGNOSIS — R2681 Unsteadiness on feet: Secondary | ICD-10-CM

## 2016-12-10 NOTE — Therapy (Signed)
[facility] [address] [place], [place], [address] Phone: [phone]   Fax:  [phone]  Physical Therapy Treatment  Patient Details  Name: Gwendolyn Morrow MRN: [id number] Date of Birth: 03-12-1941 Referring Provider: Darrow Bussing, MD  Encounter Date: 12/10/2016      PT End of Session - 12/10/16 1333    Visit Number 3   Number of Visits 5   Date for PT Re-Evaluation 12/28/16   Authorization Type Medicare Primary, BCBS secondary-G code and progress note every 10th visit   PT Start Time 1320   PT Stop Time 1350   PT Time Calculation (min) 30 min   Activity Tolerance Patient limited by pain  knee pain   Behavior During Therapy [facility] for tasks assessed/performed      Past Medical History:  Diagnosis Date  . Arthritis   . Complication of anesthesia   . GERD (gastroesophageal reflux disease)   . H/O seasonal allergies   . Headache   . Hypertension   . PONV (postoperative nausea and vomiting)     Past Surgical History:  Procedure Laterality Date  . ABDOMINAL HYSTERECTOMY    . APPENDECTOMY     removed during C-section  . BREAST SURGERY     right breast biopsy- benign  . CESAREAN SECTION    . HAND SURGERY     bilateral thumb joints replaced  . TOTAL KNEE ARTHROPLASTY Right 09/24/2016   Procedure: TOTAL KNEE ARTHROPLASTY;  Surgeon: Durene Romans, MD;  Location: [facility];  Service: Orthopedics;  Laterality: Right;  . TUBAL LIGATION      There were no vitals filed for this visit.      Subjective Assessment - 12/10/16 1320    Subjective The patient notes no dizzy episodes.  She is doing HEP well.  She reports nothing else is bothering her from a mobility standpoint (notes she is back to her baseline).    Patient Stated Goals To get rid of the dizziness and prevent a fall   Currently in Pain? Yes   Pain Score --  varies   Pain Location Knee   Pain Orientation Right   Pain Descriptors / Indicators Aching   Pain Type Chronic pain   Pain Onset More than a month ago   Pain Frequency Intermittent   Aggravating Factors  *baker's cyst   Pain Relieving Factors *she just finished with therapy for her knee.              [facility] PT Assessment - 12/10/16 1334      Functional Gait  Assessment   Gait assessed  Yes   Gait Level Surface Walks 20 ft in less than 5.5 sec, no assistive devices, good speed, no evidence for imbalance, normal gait pattern, deviates no more than 6 in outside of the 12 in walkway width.   Change in Gait Speed Able to smoothly change walking speed without loss of balance or gait deviation. Deviate no more than 6 in outside of the 12 in walkway width.   Gait with Horizontal Head Turns Performs head turns smoothly with slight change in gait velocity (eg, minor disruption to smooth gait path), deviates 6-10 in outside 12 in walkway width, or uses an assistive device.   Gait with Vertical Head Turns Performs head turns with no change in gait. Deviates no more than 6 in outside 12 in walkway width.   Gait and Pivot Turn Pivot turns safely within 3 sec and stops quickly with no  loss of balance.   Step Over Obstacle Is able to step over 2 stacked shoe boxes taped together (9 in total height) without changing gait speed. No evidence of imbalance.   Gait with Narrow Base of Support Is able to ambulate for 10 steps heel to toe with no staggering.   Gait with Eyes Closed Walks 20 ft, slow speed, abnormal gait pattern, evidence for imbalance, deviates 10-15 in outside 12 in walkway width. Requires more than 9 sec to ambulate 20 ft.   Ambulating Backwards Walks 20 ft, no assistive devices, good speed, no evidence for imbalance, normal gait   Steps Alternating feet, no rail.   Total Score 27   FGA comment: 27/30 improved from 24/30                     [facility] Adult PT Treatment/Exercise - 12/10/16 1414      Neuro Re-ed    Neuro Re-ed Details  Corner balance activities reviewing  current HEP of feet together + eyes closed,  partial heel/toe + eyes closed (both on compliant surfaces).  Progressed to add head motion to compliant surface standing with eyes closed--provided feet apart due to increased sway with narrow base of support.    Attempted marching in place with eyes open and eyes closed, however painful in R knee.  Held further marching activities.           Vestibular Treatment/Exercise - 12/10/16 1417      Vestibular Treatment/Exercise   Vestibular Treatment Provided Gaze   Gaze Exercises X1 Viewing Horizontal;X1 Viewing Vertical;X2 Viewing Horizontal     X1 Viewing Horizontal   Foot Position feet together   Comments 60 seconds with cues on increasing speed of movement x 2 reps     X1 Viewing Vertical   Foot Position feet together   Comments patient demonstrated x 30 seconds.      X2 Viewing Horizontal   Foot Position seated    Comments working on visual fixation with head + target motion x 10 repetitions               PT Education - 12/10/16 1404    Education provided Yes   Education Details HEP: Continue prior HEP (gaze x 1 feet together x 60, feet together + eyes closed on compliant, feet partial heel/toe + eyes closed on compliant).  Added gaze x 1 seated and feet apart +eyes closed on compliant.   Person(s) Educated Patient   Methods Explanation;Demonstration;Handout   Comprehension Verbalized understanding;Returned demonstration             PT Long Term Goals - 12/10/16 1334      PT LONG TERM GOAL #1   Title (TARGET DATE FOR ALL LTG 12/28/16) Pt will report resolution of dizziness with DHI decreasing by 18 points.   Baseline 54   Status On-going     PT LONG TERM GOAL #2   Title Pt will be independent with vestibular HEP   Status On-going     PT LONG TERM GOAL #3   Title Pt will improve FGA to >/= 28/30 to decrease risk for falls during dynamic gait   Baseline 24/30 on 12/07/16     Status On-going     PT LONG TERM GOAL  #4   Title Pt will demonstrate an improvement in gaze stability as indicated by a DVA 2 line difference   Baseline 2 line difference on 12/10/16   Status Achieved  Plan - 12/10/16 1410    Clinical Impression Statement The patient met LTG for SVA versus DVA.  PT progressed current HEP to include gaze x 2 seated and head motion on compliant surface with feet apart.  Patient notes continued progress and anticipate may be able to d/c early with HEP due to patient motivation and compliance.    PT Treatment/Interventions ADLs/Self Care Home Management;Canalith Repostioning;Therapeutic activities;Neuromuscular re-education;Balance training;Patient/family education;Vestibular;Therapeutic exercise   PT Next Visit Plan Check HEP, progress to tolerance; discharge next 1- 2 visits if progressing HEP well.   Consulted and Agree with Plan of Care Patient      Patient will benefit from skilled therapeutic intervention in order to improve the following deficits and impairments:  Decreased balance, Dizziness  Visit Diagnosis: Dizziness and giddiness  Unsteadiness on feet     Problem List Patient Active Problem List   Diagnosis Date Noted  . S/P right TKA 09/24/2016  . S/P knee replacement 09/24/2016    Zaahir Pickney, PT 12/10/2016, 2:18 PM  [facility] [address] [place], [place], [address] Phone: [phone]   Fax:  [phone]  Name: Gwendolyn Morrow MRN: [id number] Date of Birth: 1941/01/29

## 2016-12-10 NOTE — Patient Instructions (Signed)
  Gaze Stabilization: Standing Feet Together    Feet together, keeping eyes on target on wall __3__ feet away, tilt head down slightly and move head side to side for __60__ seconds. Repeat while moving head up and down for __60__ seconds. Do __2-3__ sessions per day.  Copyright  VHI. All rights reserved.   Visuo-Vestibular: Head / Eyes Moving in Opposite Direction    Holding a target, keep eyes on target and slowly move target side to side while moving head in OPPOSITE direction of target for 10 repetitions. Perform sitting.  Do __2__ sessions per day.   Copyright  VHI. All rights reserved.     Feet Together (Compliant Surface) Head Motion - Eyes Closed    Stand on compliant surface: pillow with feet together. Close eyes and hold x 30 seconds  Repeat 2 times per session. Do 2-3 sessions per day.  Copyright  VHI. All rights reserved.  Feet Partial Heel-Toe (Compliant Surface) Head Motion - Eyes Closed    Stand on compliant surface: pillow with right foot partially in front of the other. Close eyes and hold x 30 seconds, repeat with left foot forwards. Repeat 2 times per leg. Do 2-3 sessions per day.  Copyright  VHI. All rights reserved.   Feet Apart (Compliant Surface) Head Motion - Eyes Closed    Stand on compliant surface: _pillow_______ with feet shoulder width apart. Close eyes and move head slowly, side to side x 10 repetitions.  Rest/open your eyes.  Then close eyes and repeat vertical x 10 reps Do __1-2__ sessions per day.  Copyright  VHI. All rights reserved.

## 2016-12-17 ENCOUNTER — Ambulatory Visit: Payer: Medicare Other | Attending: Family Medicine | Admitting: Physical Therapy

## 2016-12-17 ENCOUNTER — Encounter: Payer: Self-pay | Admitting: Physical Therapy

## 2016-12-17 DIAGNOSIS — R42 Dizziness and giddiness: Secondary | ICD-10-CM | POA: Diagnosis not present

## 2016-12-17 DIAGNOSIS — R2681 Unsteadiness on feet: Secondary | ICD-10-CM | POA: Insufficient documentation

## 2016-12-17 NOTE — Therapy (Signed)
[facility] [address] [place], [place], [address] Phone: [phone]   Fax:  [phone]  Physical Therapy Treatment  Patient Details  Name: Gwendolyn Morrow MRN: [id number] Date of Birth: 10/29/1940 Referring Provider: Lujean Amel, MD  Encounter Date: 12/17/2016      PT End of Session - 12/17/16 1012    Visit Number 4   Number of Visits 5   Date for PT Re-Evaluation 12/28/16  re-assess on last visit 12/24/16   Authorization Type Medicare Primary, BCBS secondary-G code and progress note every 10th visit   PT Start Time 0932   PT Stop Time 1012   PT Time Calculation (min) 40 min   Activity Tolerance Patient tolerated treatment well   Behavior During Therapy [facility] for tasks assessed/performed      Past Medical History:  Diagnosis Date  . Arthritis   . Complication of anesthesia   . GERD (gastroesophageal reflux disease)   . H/O seasonal allergies   . Headache   . Hypertension   . PONV (postoperative nausea and vomiting)     Past Surgical History:  Procedure Laterality Date  . ABDOMINAL HYSTERECTOMY    . APPENDECTOMY     removed during C-section  . BREAST SURGERY     right breast biopsy- benign  . CESAREAN SECTION    . HAND SURGERY     bilateral thumb joints replaced  . TOTAL KNEE ARTHROPLASTY Right 09/24/2016   Procedure: TOTAL KNEE ARTHROPLASTY;  Surgeon: Paralee Cancel, MD;  Location: [facility];  Service: Orthopedics;  Laterality: Right;  . TUBAL LIGATION      There were no vitals filed for this visit.      Subjective Assessment - 12/17/16 0936    Subjective Pt continues to report resolution of dizziness symptoms and no issues with balance.  Pt is having difficulty with x 2 viewing exercise.   Pertinent History h/o BPPV   Limitations Other (comment)   Patient Stated Goals To get rid of the dizziness and prevent a fall   Currently in Pain? Yes   Pain Score 4    Pain Location Knee   Pain Orientation  Right   Pain Descriptors / Indicators Aching   Pain Type Chronic pain            Vestibular Treatment/Exercise - 12/17/16 0937      Vestibular Treatment/Exercise   Vestibular Treatment Provided Gaze   Gaze Exercises X1 Viewing Horizontal;X1 Viewing Vertical;X2 Viewing Horizontal;X2 Viewing Vertical     X1 Viewing Horizontal   Foot Position R and L partial tandem   Reps 2   Comments 60 seconds, focus on faster head movements     X1 Viewing Vertical   Foot Position R and L partial tandem   Reps 2   Comments 60 seconds, focus on faster head movements     X2 Viewing Horizontal   Foot Position seated   Reps 10    Comments 2 sets with verbal cues     X2 Viewing Vertical   Foot Position seated   Reps 10   Comments 2 sets with verbal cues            Balance Exercises - 12/17/16 1009      Balance Exercises: Standing   Retro Gait 5 reps  eyes closed   Sidestepping 5 reps  eyes closed   Sit to Stand Time x 12 reps without UE support with eyes closed   Other Standing Exercises forwards  gait x 5 reps with eyes closed           PT Education - 12/17/16 1012    Education provided Yes   Education Details Progression of HEP; POC and plan for next visit   Person(s) Educated Patient   Methods Explanation;Handout;Demonstration   Comprehension Verbalized understanding;Returned demonstration             PT Long Term Goals - 12/17/16 1345      PT LONG TERM GOAL #1   Title (TARGET DATE FOR ALL LTG 12/28/16) Pt will report resolution of dizziness with [place] decreasing by 18 points.   Baseline 54   Status On-going     PT LONG TERM GOAL #2   Title Pt will be independent with vestibular HEP   Status On-going     PT LONG TERM GOAL #3   Title Pt will improve FGA to >/= 28/30 to decrease risk for falls during dynamic gait   Baseline 24/30 on 12/07/16     Status On-going     PT LONG TERM GOAL #4   Title Pt will demonstrate an improvement in gaze stability as  indicated by a DVA 2 line difference   Baseline 2 line difference on 12/10/16   Status Achieved               Plan - 12/17/16 1343    Clinical Impression Statement Pt continues to make good progress and has able to progress HEP to include various directions for x 2 viewing and was able to tolerate more challenging positions for x 1 viewing.  Pt will likely be ready to D/C at next visit.     PT Treatment/Interventions ADLs/Self Care Home Management;Canalith Repostioning;Therapeutic activities;Neuromuscular re-education;Balance training;Patient/family education;Vestibular;Therapeutic exercise   PT Next Visit Plan Re-assess LTG and plan to D/C   Consulted and Agree with Plan of Care Patient      Patient will benefit from skilled therapeutic intervention in order to improve the following deficits and impairments:  Decreased balance, Dizziness  Visit Diagnosis: Dizziness and giddiness  Unsteadiness on feet     Problem List Patient Active Problem List   Diagnosis Date Noted  . S/P right TKA 09/24/2016  . S/P knee replacement 09/24/2016    Raylene Everts, PT, DPT 12/17/16    1:46 PM    [facility] [address] [place], [place], [address] Phone: [phone]   Fax:  [phone]  Name: Gwendolyn Morrow MRN: [id number] Date of Birth: 26-Mar-1941

## 2016-12-17 NOTE — Patient Instructions (Signed)
For VOR x 1 viewing-change to staggered stance-R foot forwards-60 seconds up/down, 60 seconds side to side.  L foot forwards-60 seconds up/down, 60 seconds side to side.  Try to go a little faster.  For Head and Letter moving in opposite directions: add in up<>down x 10 repetitions in sitting

## 2016-12-19 DIAGNOSIS — Z96651 Presence of right artificial knee joint: Secondary | ICD-10-CM | POA: Diagnosis not present

## 2016-12-19 DIAGNOSIS — Z471 Aftercare following joint replacement surgery: Secondary | ICD-10-CM | POA: Diagnosis not present

## 2016-12-24 ENCOUNTER — Encounter: Payer: Medicare Other | Admitting: Physical Therapy

## 2016-12-24 ENCOUNTER — Ambulatory Visit: Payer: Medicare Other | Admitting: Physical Therapy

## 2016-12-24 ENCOUNTER — Encounter: Payer: Self-pay | Admitting: Physical Therapy

## 2016-12-24 DIAGNOSIS — R2681 Unsteadiness on feet: Secondary | ICD-10-CM | POA: Diagnosis not present

## 2016-12-24 DIAGNOSIS — R42 Dizziness and giddiness: Secondary | ICD-10-CM

## 2016-12-24 NOTE — Patient Instructions (Signed)
Stop performing eye-letter exercise with letter on wall.  Continue letter-eye exercise with head and letter moving in opposite directions-standing with feet together-10 repetitions each direction; gradually increase to 20 repetitions  Continue corner balance exercises to tolerance with knee.  Congratulations on your graduation from PT-call us if your dizziness returns.  Thanks!

## 2016-12-24 NOTE — Therapy (Signed)
[facility] [address] [place], [place], [address] Phone: [phone]   Fax:  [phone]  Physical Therapy Treatment  Patient Details  Name: Gwendolyn Morrow MRN: [id number] Date of Birth: 06-03-1941 Referring Provider: Lujean Amel, MD  Encounter Date: 12/24/2016      PT End of Session - 12/24/16 1526    Visit Number 5   Number of Visits 5   Date for PT Re-Evaluation 12/28/16  D/C today; no further visits needed   Authorization Type Medicare Primary, BCBS secondary-G code and progress note every 10th visit   PT Start Time 1448   PT Stop Time 1525   PT Time Calculation (min) 37 min   Activity Tolerance Patient tolerated treatment well   Behavior During Therapy [facility] for tasks assessed/performed      Past Medical History:  Diagnosis Date  . Arthritis   . Complication of anesthesia   . GERD (gastroesophageal reflux disease)   . H/O seasonal allergies   . Headache   . Hypertension   . PONV (postoperative nausea and vomiting)     Past Surgical History:  Procedure Laterality Date  . ABDOMINAL HYSTERECTOMY    . APPENDECTOMY     removed during C-section  . BREAST SURGERY     right breast biopsy- benign  . CESAREAN SECTION    . HAND SURGERY     bilateral thumb joints replaced  . TOTAL KNEE ARTHROPLASTY Right 09/24/2016   Procedure: TOTAL KNEE ARTHROPLASTY;  Surgeon: Paralee Cancel, MD;  Location: [facility];  Service: Orthopedics;  Laterality: Right;  . TUBAL LIGATION      There were no vitals filed for this visit.      Subjective Assessment - 12/24/16 1451    Subjective Still no problems with dizziness; continues to have difficulty with x 2 viewing horizontal; no issues with vertical.    Pertinent History h/o BPPV   Limitations Other (comment)   Patient Stated Goals To get rid of the dizziness and prevent a fall   Currently in Pain? No/denies            [facility] PT Assessment - 12/24/16 1453      Observation/Other Assessments   Focus on Therapeutic Outcomes (FOTO)  75% (25% limited)   Dizziness Handicap Inventory [facility])  4     Functional Gait  Assessment   Gait assessed  Yes   Gait Level Surface Walks 20 ft in less than 5.5 sec, no assistive devices, good speed, no evidence for imbalance, normal gait pattern, deviates no more than 6 in outside of the 12 in walkway width.   Change in Gait Speed Able to smoothly change walking speed without loss of balance or gait deviation. Deviate no more than 6 in outside of the 12 in walkway width.   Gait with Horizontal Head Turns Performs head turns smoothly with no change in gait. Deviates no more than 6 in outside 12 in walkway width   Gait with Vertical Head Turns Performs head turns with no change in gait. Deviates no more than 6 in outside 12 in walkway width.   Gait and Pivot Turn Pivot turns safely within 3 sec and stops quickly with no loss of balance.   Step Over Obstacle Is able to step over 2 stacked shoe boxes taped together (9 in total height) without changing gait speed. No evidence of imbalance.   Gait with Narrow Base of Support Is able to ambulate for 10 steps heel to toe  with no staggering.   Gait with Eyes Closed Walks 20 ft, uses assistive device, slower speed, mild gait deviations, deviates 6-10 in outside 12 in walkway width. Ambulates 20 ft in less than 9 sec but greater than 7 sec.   Ambulating Backwards Walks 20 ft, no assistive devices, good speed, no evidence for imbalance, normal gait   Steps Alternating feet, no rail.   Total Score 29   FGA comment: 29/30                      Vestibular Treatment/Exercise - 12/24/16 1520      Vestibular Treatment/Exercise   Vestibular Treatment Provided Gaze   Gaze Exercises X1 Viewing Horizontal;X1 Viewing Vertical;X2 Viewing Horizontal;X2 Viewing Vertical     X1 Viewing Horizontal   Foot Position partial tandem and feet together   Reps 2   Comments 60 seconds blank  background, 30 seconds busy background     X1 Viewing Vertical   Foot Position partial tandem, feet together   Reps 2   Comments 60 seconds blank background, 30 seconds busy background     X2 Viewing Horizontal   Foot Position feet together   Reps 1    Comments 10 reps     X2 Viewing Vertical   Foot Position feet together   Reps 1   Comments 10 reps            Balance Exercises - 12/24/16 1521      Balance Exercises: Standing   Standing Eyes Closed Narrow base of support (BOS);Wide (BOA);Head turns;Foam/compliant surface;30 secs  feet together, partial tandem, feet apart head turns           PT Education - 12/24/16 1525    Education provided Yes   Education Details goals met, HEP    Person(s) Educated Patient   Methods Explanation;Demonstration;Handout   Comprehension Verbalized understanding;Returned demonstration             PT Long Term Goals - 12/24/16 1516      PT LONG TERM GOAL #1   Title (TARGET DATE FOR ALL LTG 12/28/16) Pt will report resolution of dizziness with [place] decreasing by 18 points.   Baseline 54; 4 on D/C   Status Achieved     PT LONG TERM GOAL #2   Title Pt will be independent with vestibular HEP   Status Achieved     PT LONG TERM GOAL #3   Title Pt will improve FGA to >/= 28/30 to decrease risk for falls during dynamic gait   Baseline 24/30 on 12/07/16     Status Achieved     PT LONG TERM GOAL #4   Title Pt will demonstrate an improvement in gaze stability as indicated by a DVA 2 line difference   Baseline 2 line difference on 12/10/16   Status Achieved               Plan - 12/24/16 1654    Clinical Impression Statement Pt has made good progress and has met all LTG with complete resolutions of dizziness.  Remaining balance deficits seem to be related to pt's knee pain.  Pt to continue with HEP and will begin community wellness with personal trainer.  Pt safe and clear for D/C by PT.   PT Treatment/Interventions  ADLs/Self Care Home Management;Canalith Repostioning;Therapeutic activities;Neuromuscular re-education;Balance training;Patient/family education;Vestibular;Therapeutic exercise   PT Next Visit Plan D/C today   Consulted and Agree with Plan of Care Patient  Patient will benefit from skilled therapeutic intervention in order to improve the following deficits and impairments:  Decreased balance, Dizziness  Visit Diagnosis: Dizziness and giddiness  Unsteadiness on feet       G-Codes - 01/07/17 1527    Functional Assessment Tool Used (Outpatient Only) DHI; FGA 29/30   Functional Limitation Mobility: Walking and moving around   Mobility: Walking and Moving Around Goal Status [phone]) 0 percent impaired, limited or restricted   Mobility: Walking and Moving Around Discharge Status [phone]) At least 1 percent but less than 20 percent impaired, limited or restricted      Problem List Patient Active Problem List   Diagnosis Date Noted  . S/P right TKA 09/24/2016  . S/P knee replacement 09/24/2016   PHYSICAL THERAPY DISCHARGE SUMMARY  Visits from Start of Care: 5  Current functional level related to goals / functional outcomes: See above   Remaining deficits: Knee pain and balance deficits   Education / Equipment: HEP  Plan: Patient agrees to discharge.  Patient goals were met. Patient is being discharged due to meeting the stated rehab goals.  ?????     Raylene Everts, PT, DPT 01-07-2017    4:57 PM    [facility] [address] [place], [place], [address] Phone: [phone]   Fax:  [phone]  Name: Gwendolyn Morrow MRN: [id number] Date of Birth: 30-Sep-1940

## 2016-12-26 DIAGNOSIS — M1712 Unilateral primary osteoarthritis, left knee: Secondary | ICD-10-CM | POA: Diagnosis not present

## 2016-12-31 DIAGNOSIS — K219 Gastro-esophageal reflux disease without esophagitis: Secondary | ICD-10-CM | POA: Diagnosis not present

## 2016-12-31 DIAGNOSIS — Z23 Encounter for immunization: Secondary | ICD-10-CM | POA: Diagnosis not present

## 2016-12-31 DIAGNOSIS — E559 Vitamin D deficiency, unspecified: Secondary | ICD-10-CM | POA: Diagnosis not present

## 2016-12-31 DIAGNOSIS — Z0000 Encounter for general adult medical examination without abnormal findings: Secondary | ICD-10-CM | POA: Diagnosis not present

## 2016-12-31 DIAGNOSIS — D81818 Other biotin-dependent carboxylase deficiency: Secondary | ICD-10-CM | POA: Diagnosis not present

## 2016-12-31 DIAGNOSIS — I10 Essential (primary) hypertension: Secondary | ICD-10-CM | POA: Diagnosis not present

## 2016-12-31 DIAGNOSIS — E7800 Pure hypercholesterolemia, unspecified: Secondary | ICD-10-CM | POA: Diagnosis not present

## 2016-12-31 DIAGNOSIS — Z79899 Other long term (current) drug therapy: Secondary | ICD-10-CM | POA: Diagnosis not present

## 2017-01-02 DIAGNOSIS — M1712 Unilateral primary osteoarthritis, left knee: Secondary | ICD-10-CM | POA: Diagnosis not present

## 2017-01-09 DIAGNOSIS — M1712 Unilateral primary osteoarthritis, left knee: Secondary | ICD-10-CM | POA: Diagnosis not present

## 2017-01-11 DIAGNOSIS — R05 Cough: Secondary | ICD-10-CM | POA: Diagnosis not present

## 2017-01-11 DIAGNOSIS — Z1231 Encounter for screening mammogram for malignant neoplasm of breast: Secondary | ICD-10-CM | POA: Diagnosis not present

## 2017-01-11 DIAGNOSIS — J209 Acute bronchitis, unspecified: Secondary | ICD-10-CM | POA: Diagnosis not present

## 2017-01-16 DIAGNOSIS — M1712 Unilateral primary osteoarthritis, left knee: Secondary | ICD-10-CM | POA: Diagnosis not present

## 2017-01-23 DIAGNOSIS — M1712 Unilateral primary osteoarthritis, left knee: Secondary | ICD-10-CM | POA: Diagnosis not present

## 2017-01-28 DIAGNOSIS — M25511 Pain in right shoulder: Secondary | ICD-10-CM | POA: Diagnosis not present

## 2017-01-28 DIAGNOSIS — G8929 Other chronic pain: Secondary | ICD-10-CM | POA: Diagnosis not present

## 2017-01-28 DIAGNOSIS — M7541 Impingement syndrome of right shoulder: Secondary | ICD-10-CM | POA: Diagnosis not present

## 2017-01-28 DIAGNOSIS — M7542 Impingement syndrome of left shoulder: Secondary | ICD-10-CM | POA: Diagnosis not present

## 2017-02-16 ENCOUNTER — Encounter (HOSPITAL_COMMUNITY): Payer: Self-pay | Admitting: Orthopedic Surgery

## 2017-02-16 NOTE — Addendum Note (Signed)
Addendum  created 02/16/17 0801 by Devoiry Corriher, MD   Sign clinical note    

## 2017-02-25 DIAGNOSIS — R399 Unspecified symptoms and signs involving the genitourinary system: Secondary | ICD-10-CM | POA: Diagnosis not present

## 2017-02-25 DIAGNOSIS — R05 Cough: Secondary | ICD-10-CM | POA: Diagnosis not present

## 2017-03-09 DIAGNOSIS — J209 Acute bronchitis, unspecified: Secondary | ICD-10-CM | POA: Diagnosis not present

## 2017-03-09 DIAGNOSIS — J3089 Other allergic rhinitis: Secondary | ICD-10-CM | POA: Diagnosis not present

## 2017-05-02 DIAGNOSIS — Z86018 Personal history of other benign neoplasm: Secondary | ICD-10-CM | POA: Diagnosis not present

## 2017-05-02 DIAGNOSIS — L821 Other seborrheic keratosis: Secondary | ICD-10-CM | POA: Diagnosis not present

## 2017-05-02 DIAGNOSIS — L570 Actinic keratosis: Secondary | ICD-10-CM | POA: Diagnosis not present

## 2017-05-02 DIAGNOSIS — Z85828 Personal history of other malignant neoplasm of skin: Secondary | ICD-10-CM | POA: Diagnosis not present

## 2017-05-02 DIAGNOSIS — L708 Other acne: Secondary | ICD-10-CM | POA: Diagnosis not present

## 2017-05-16 DIAGNOSIS — Z471 Aftercare following joint replacement surgery: Secondary | ICD-10-CM | POA: Diagnosis not present

## 2017-05-16 DIAGNOSIS — Z96651 Presence of right artificial knee joint: Secondary | ICD-10-CM | POA: Diagnosis not present

## 2017-05-22 DIAGNOSIS — L0100 Impetigo, unspecified: Secondary | ICD-10-CM | POA: Diagnosis not present

## 2017-05-22 DIAGNOSIS — T451X5A Adverse effect of antineoplastic and immunosuppressive drugs, initial encounter: Secondary | ICD-10-CM | POA: Diagnosis not present

## 2017-05-30 DIAGNOSIS — A4902 Methicillin resistant Staphylococcus aureus infection, unspecified site: Secondary | ICD-10-CM | POA: Diagnosis not present

## 2017-05-30 DIAGNOSIS — Z23 Encounter for immunization: Secondary | ICD-10-CM | POA: Diagnosis not present

## 2017-05-30 DIAGNOSIS — T451X5A Adverse effect of antineoplastic and immunosuppressive drugs, initial encounter: Secondary | ICD-10-CM | POA: Diagnosis not present

## 2017-06-06 DIAGNOSIS — Z23 Encounter for immunization: Secondary | ICD-10-CM | POA: Diagnosis not present

## 2017-07-03 DIAGNOSIS — M25511 Pain in right shoulder: Secondary | ICD-10-CM | POA: Diagnosis not present

## 2017-07-03 DIAGNOSIS — M7541 Impingement syndrome of right shoulder: Secondary | ICD-10-CM | POA: Diagnosis not present

## 2017-07-03 DIAGNOSIS — G8929 Other chronic pain: Secondary | ICD-10-CM | POA: Diagnosis not present

## 2017-07-09 DIAGNOSIS — H10413 Chronic giant papillary conjunctivitis, bilateral: Secondary | ICD-10-CM | POA: Diagnosis not present

## 2017-08-14 DIAGNOSIS — M1712 Unilateral primary osteoarthritis, left knee: Secondary | ICD-10-CM | POA: Diagnosis not present

## 2017-08-21 DIAGNOSIS — M1712 Unilateral primary osteoarthritis, left knee: Secondary | ICD-10-CM | POA: Diagnosis not present

## 2017-08-28 DIAGNOSIS — M1712 Unilateral primary osteoarthritis, left knee: Secondary | ICD-10-CM | POA: Diagnosis not present

## 2017-09-04 DIAGNOSIS — Z1211 Encounter for screening for malignant neoplasm of colon: Secondary | ICD-10-CM | POA: Diagnosis not present

## 2017-09-04 DIAGNOSIS — K635 Polyp of colon: Secondary | ICD-10-CM | POA: Diagnosis not present

## 2017-09-04 DIAGNOSIS — K5730 Diverticulosis of large intestine without perforation or abscess without bleeding: Secondary | ICD-10-CM | POA: Diagnosis not present

## 2017-09-06 DIAGNOSIS — M1712 Unilateral primary osteoarthritis, left knee: Secondary | ICD-10-CM | POA: Diagnosis not present

## 2017-09-13 DIAGNOSIS — K635 Polyp of colon: Secondary | ICD-10-CM | POA: Diagnosis not present

## 2017-09-13 DIAGNOSIS — M1712 Unilateral primary osteoarthritis, left knee: Secondary | ICD-10-CM | POA: Diagnosis not present

## 2017-09-13 DIAGNOSIS — Z1211 Encounter for screening for malignant neoplasm of colon: Secondary | ICD-10-CM | POA: Diagnosis not present

## 2017-09-25 DIAGNOSIS — G5781 Other specified mononeuropathies of right lower limb: Secondary | ICD-10-CM | POA: Diagnosis not present

## 2017-09-25 DIAGNOSIS — M7061 Trochanteric bursitis, right hip: Secondary | ICD-10-CM | POA: Diagnosis not present

## 2017-09-25 DIAGNOSIS — Z96651 Presence of right artificial knee joint: Secondary | ICD-10-CM | POA: Diagnosis not present

## 2017-10-25 DIAGNOSIS — R350 Frequency of micturition: Secondary | ICD-10-CM | POA: Diagnosis not present

## 2017-10-25 DIAGNOSIS — J069 Acute upper respiratory infection, unspecified: Secondary | ICD-10-CM | POA: Diagnosis not present

## 2017-11-04 DIAGNOSIS — M25519 Pain in unspecified shoulder: Secondary | ICD-10-CM | POA: Diagnosis not present

## 2017-11-04 DIAGNOSIS — M7541 Impingement syndrome of right shoulder: Secondary | ICD-10-CM | POA: Diagnosis not present

## 2017-11-12 DIAGNOSIS — L570 Actinic keratosis: Secondary | ICD-10-CM | POA: Diagnosis not present

## 2017-11-12 DIAGNOSIS — Z23 Encounter for immunization: Secondary | ICD-10-CM | POA: Diagnosis not present

## 2017-12-02 DIAGNOSIS — G5621 Lesion of ulnar nerve, right upper limb: Secondary | ICD-10-CM | POA: Diagnosis not present

## 2017-12-02 DIAGNOSIS — M25511 Pain in right shoulder: Secondary | ICD-10-CM | POA: Diagnosis not present

## 2017-12-02 DIAGNOSIS — M7541 Impingement syndrome of right shoulder: Secondary | ICD-10-CM | POA: Diagnosis not present

## 2017-12-27 DIAGNOSIS — M7541 Impingement syndrome of right shoulder: Secondary | ICD-10-CM | POA: Diagnosis not present

## 2017-12-27 DIAGNOSIS — M25511 Pain in right shoulder: Secondary | ICD-10-CM | POA: Diagnosis not present

## 2017-12-27 DIAGNOSIS — G5621 Lesion of ulnar nerve, right upper limb: Secondary | ICD-10-CM | POA: Diagnosis not present

## 2017-12-27 DIAGNOSIS — M25521 Pain in right elbow: Secondary | ICD-10-CM | POA: Diagnosis not present

## 2018-01-07 DIAGNOSIS — I10 Essential (primary) hypertension: Secondary | ICD-10-CM | POA: Diagnosis not present

## 2018-01-07 DIAGNOSIS — Z0001 Encounter for general adult medical examination with abnormal findings: Secondary | ICD-10-CM | POA: Diagnosis not present

## 2018-01-07 DIAGNOSIS — K219 Gastro-esophageal reflux disease without esophagitis: Secondary | ICD-10-CM | POA: Diagnosis not present

## 2018-01-07 DIAGNOSIS — E7800 Pure hypercholesterolemia, unspecified: Secondary | ICD-10-CM | POA: Diagnosis not present

## 2018-01-07 DIAGNOSIS — Z79899 Other long term (current) drug therapy: Secondary | ICD-10-CM | POA: Diagnosis not present

## 2018-03-21 DIAGNOSIS — R399 Unspecified symptoms and signs involving the genitourinary system: Secondary | ICD-10-CM | POA: Diagnosis not present

## 2018-03-21 DIAGNOSIS — M1712 Unilateral primary osteoarthritis, left knee: Secondary | ICD-10-CM | POA: Diagnosis not present

## 2018-03-28 DIAGNOSIS — M1712 Unilateral primary osteoarthritis, left knee: Secondary | ICD-10-CM | POA: Diagnosis not present

## 2018-04-04 DIAGNOSIS — M1712 Unilateral primary osteoarthritis, left knee: Secondary | ICD-10-CM | POA: Diagnosis not present

## 2018-04-11 DIAGNOSIS — M1712 Unilateral primary osteoarthritis, left knee: Secondary | ICD-10-CM | POA: Diagnosis not present

## 2018-04-18 DIAGNOSIS — M1712 Unilateral primary osteoarthritis, left knee: Secondary | ICD-10-CM | POA: Diagnosis not present

## 2018-04-23 DIAGNOSIS — S39012A Strain of muscle, fascia and tendon of lower back, initial encounter: Secondary | ICD-10-CM | POA: Diagnosis not present

## 2018-04-23 DIAGNOSIS — K219 Gastro-esophageal reflux disease without esophagitis: Secondary | ICD-10-CM | POA: Diagnosis not present

## 2018-04-23 DIAGNOSIS — R399 Unspecified symptoms and signs involving the genitourinary system: Secondary | ICD-10-CM | POA: Diagnosis not present

## 2018-05-08 DIAGNOSIS — D485 Neoplasm of uncertain behavior of skin: Secondary | ICD-10-CM | POA: Diagnosis not present

## 2018-05-08 DIAGNOSIS — Z85828 Personal history of other malignant neoplasm of skin: Secondary | ICD-10-CM | POA: Diagnosis not present

## 2018-05-08 DIAGNOSIS — Z86018 Personal history of other benign neoplasm: Secondary | ICD-10-CM | POA: Diagnosis not present

## 2018-05-08 DIAGNOSIS — D0461 Carcinoma in situ of skin of right upper limb, including shoulder: Secondary | ICD-10-CM | POA: Diagnosis not present

## 2018-05-08 DIAGNOSIS — L821 Other seborrheic keratosis: Secondary | ICD-10-CM | POA: Diagnosis not present

## 2018-05-26 DIAGNOSIS — K219 Gastro-esophageal reflux disease without esophagitis: Secondary | ICD-10-CM | POA: Diagnosis not present

## 2018-05-26 DIAGNOSIS — R1314 Dysphagia, pharyngoesophageal phase: Secondary | ICD-10-CM | POA: Diagnosis not present

## 2018-06-02 DIAGNOSIS — K2930 Chronic superficial gastritis without bleeding: Secondary | ICD-10-CM | POA: Diagnosis not present

## 2018-06-02 DIAGNOSIS — K210 Gastro-esophageal reflux disease with esophagitis: Secondary | ICD-10-CM | POA: Diagnosis not present

## 2018-06-02 DIAGNOSIS — R12 Heartburn: Secondary | ICD-10-CM | POA: Diagnosis not present

## 2018-06-02 DIAGNOSIS — R1310 Dysphagia, unspecified: Secondary | ICD-10-CM | POA: Diagnosis not present

## 2018-06-02 DIAGNOSIS — K449 Diaphragmatic hernia without obstruction or gangrene: Secondary | ICD-10-CM | POA: Diagnosis not present

## 2018-06-02 DIAGNOSIS — R140 Abdominal distension (gaseous): Secondary | ICD-10-CM | POA: Diagnosis not present

## 2018-06-02 DIAGNOSIS — R1013 Epigastric pain: Secondary | ICD-10-CM | POA: Diagnosis not present

## 2018-06-04 DIAGNOSIS — K2930 Chronic superficial gastritis without bleeding: Secondary | ICD-10-CM | POA: Diagnosis not present

## 2018-06-04 DIAGNOSIS — K210 Gastro-esophageal reflux disease with esophagitis: Secondary | ICD-10-CM | POA: Diagnosis not present

## 2018-06-19 DIAGNOSIS — Z23 Encounter for immunization: Secondary | ICD-10-CM | POA: Diagnosis not present

## 2018-06-19 DIAGNOSIS — D0461 Carcinoma in situ of skin of right upper limb, including shoulder: Secondary | ICD-10-CM | POA: Diagnosis not present

## 2018-06-20 DIAGNOSIS — K219 Gastro-esophageal reflux disease without esophagitis: Secondary | ICD-10-CM | POA: Diagnosis not present

## 2018-07-01 DIAGNOSIS — R350 Frequency of micturition: Secondary | ICD-10-CM | POA: Diagnosis not present

## 2018-07-01 DIAGNOSIS — N3090 Cystitis, unspecified without hematuria: Secondary | ICD-10-CM | POA: Diagnosis not present

## 2018-07-10 DIAGNOSIS — H2513 Age-related nuclear cataract, bilateral: Secondary | ICD-10-CM | POA: Diagnosis not present

## 2018-08-01 DIAGNOSIS — H1013 Acute atopic conjunctivitis, bilateral: Secondary | ICD-10-CM | POA: Diagnosis not present

## 2018-08-22 DIAGNOSIS — H6123 Impacted cerumen, bilateral: Secondary | ICD-10-CM | POA: Diagnosis not present

## 2018-08-28 DIAGNOSIS — H6121 Impacted cerumen, right ear: Secondary | ICD-10-CM | POA: Diagnosis not present

## 2018-08-28 DIAGNOSIS — G4700 Insomnia, unspecified: Secondary | ICD-10-CM | POA: Diagnosis not present

## 2018-09-06 DIAGNOSIS — H6122 Impacted cerumen, left ear: Secondary | ICD-10-CM | POA: Diagnosis not present

## 2018-09-08 DIAGNOSIS — R319 Hematuria, unspecified: Secondary | ICD-10-CM | POA: Diagnosis not present

## 2018-09-08 DIAGNOSIS — N390 Urinary tract infection, site not specified: Secondary | ICD-10-CM | POA: Diagnosis not present

## 2018-09-08 DIAGNOSIS — R350 Frequency of micturition: Secondary | ICD-10-CM | POA: Diagnosis not present

## 2018-09-08 DIAGNOSIS — N3020 Other chronic cystitis without hematuria: Secondary | ICD-10-CM | POA: Diagnosis not present

## 2018-09-15 DIAGNOSIS — R319 Hematuria, unspecified: Secondary | ICD-10-CM | POA: Diagnosis not present

## 2018-09-15 DIAGNOSIS — N3020 Other chronic cystitis without hematuria: Secondary | ICD-10-CM | POA: Diagnosis not present

## 2018-09-26 DIAGNOSIS — N3020 Other chronic cystitis without hematuria: Secondary | ICD-10-CM | POA: Diagnosis not present

## 2018-09-26 DIAGNOSIS — R350 Frequency of micturition: Secondary | ICD-10-CM | POA: Diagnosis not present

## 2018-10-13 DIAGNOSIS — R399 Unspecified symptoms and signs involving the genitourinary system: Secondary | ICD-10-CM | POA: Diagnosis not present

## 2018-10-27 DIAGNOSIS — M1712 Unilateral primary osteoarthritis, left knee: Secondary | ICD-10-CM | POA: Diagnosis not present

## 2018-11-03 DIAGNOSIS — M1712 Unilateral primary osteoarthritis, left knee: Secondary | ICD-10-CM | POA: Diagnosis not present

## 2018-11-10 DIAGNOSIS — M1712 Unilateral primary osteoarthritis, left knee: Secondary | ICD-10-CM | POA: Diagnosis not present

## 2018-11-17 DIAGNOSIS — M1712 Unilateral primary osteoarthritis, left knee: Secondary | ICD-10-CM | POA: Diagnosis not present

## 2018-11-24 DIAGNOSIS — M1712 Unilateral primary osteoarthritis, left knee: Secondary | ICD-10-CM | POA: Diagnosis not present

## 2019-01-08 DIAGNOSIS — D81818 Other biotin-dependent carboxylase deficiency: Secondary | ICD-10-CM | POA: Diagnosis not present

## 2019-01-08 DIAGNOSIS — E7800 Pure hypercholesterolemia, unspecified: Secondary | ICD-10-CM | POA: Diagnosis not present

## 2019-01-08 DIAGNOSIS — Z79899 Other long term (current) drug therapy: Secondary | ICD-10-CM | POA: Diagnosis not present

## 2019-01-08 DIAGNOSIS — E559 Vitamin D deficiency, unspecified: Secondary | ICD-10-CM | POA: Diagnosis not present

## 2019-01-13 DIAGNOSIS — I10 Essential (primary) hypertension: Secondary | ICD-10-CM | POA: Diagnosis not present

## 2019-01-13 DIAGNOSIS — E559 Vitamin D deficiency, unspecified: Secondary | ICD-10-CM | POA: Diagnosis not present

## 2019-01-13 DIAGNOSIS — E7800 Pure hypercholesterolemia, unspecified: Secondary | ICD-10-CM | POA: Diagnosis not present

## 2019-01-13 DIAGNOSIS — D81818 Other biotin-dependent carboxylase deficiency: Secondary | ICD-10-CM | POA: Diagnosis not present

## 2019-01-13 DIAGNOSIS — K219 Gastro-esophageal reflux disease without esophagitis: Secondary | ICD-10-CM | POA: Diagnosis not present

## 2019-01-13 DIAGNOSIS — Z0001 Encounter for general adult medical examination with abnormal findings: Secondary | ICD-10-CM | POA: Diagnosis not present

## 2019-03-30 DIAGNOSIS — N3020 Other chronic cystitis without hematuria: Secondary | ICD-10-CM | POA: Diagnosis not present

## 2019-03-30 DIAGNOSIS — R350 Frequency of micturition: Secondary | ICD-10-CM | POA: Diagnosis not present

## 2019-03-30 DIAGNOSIS — N952 Postmenopausal atrophic vaginitis: Secondary | ICD-10-CM | POA: Diagnosis not present

## 2019-04-02 DIAGNOSIS — M7541 Impingement syndrome of right shoulder: Secondary | ICD-10-CM | POA: Diagnosis not present

## 2019-05-15 DIAGNOSIS — H6123 Impacted cerumen, bilateral: Secondary | ICD-10-CM | POA: Diagnosis not present

## 2019-05-20 DIAGNOSIS — Z85828 Personal history of other malignant neoplasm of skin: Secondary | ICD-10-CM | POA: Diagnosis not present

## 2019-05-20 DIAGNOSIS — L308 Other specified dermatitis: Secondary | ICD-10-CM | POA: Diagnosis not present

## 2019-05-20 DIAGNOSIS — L708 Other acne: Secondary | ICD-10-CM | POA: Diagnosis not present

## 2019-05-20 DIAGNOSIS — D485 Neoplasm of uncertain behavior of skin: Secondary | ICD-10-CM | POA: Diagnosis not present

## 2019-05-20 DIAGNOSIS — L570 Actinic keratosis: Secondary | ICD-10-CM | POA: Diagnosis not present

## 2019-05-20 DIAGNOSIS — D1722 Benign lipomatous neoplasm of skin and subcutaneous tissue of left arm: Secondary | ICD-10-CM | POA: Diagnosis not present

## 2019-05-20 DIAGNOSIS — L821 Other seborrheic keratosis: Secondary | ICD-10-CM | POA: Diagnosis not present

## 2019-05-21 DIAGNOSIS — M25551 Pain in right hip: Secondary | ICD-10-CM | POA: Diagnosis not present

## 2019-05-21 DIAGNOSIS — M7061 Trochanteric bursitis, right hip: Secondary | ICD-10-CM | POA: Diagnosis not present

## 2019-05-22 DIAGNOSIS — Z23 Encounter for immunization: Secondary | ICD-10-CM | POA: Diagnosis not present

## 2019-07-02 DIAGNOSIS — M25551 Pain in right hip: Secondary | ICD-10-CM | POA: Diagnosis not present

## 2019-07-02 DIAGNOSIS — M1612 Unilateral primary osteoarthritis, left hip: Secondary | ICD-10-CM | POA: Diagnosis not present

## 2019-07-02 DIAGNOSIS — M7061 Trochanteric bursitis, right hip: Secondary | ICD-10-CM | POA: Diagnosis not present

## 2019-07-14 DIAGNOSIS — H25043 Posterior subcapsular polar age-related cataract, bilateral: Secondary | ICD-10-CM | POA: Diagnosis not present

## 2019-07-14 DIAGNOSIS — H52203 Unspecified astigmatism, bilateral: Secondary | ICD-10-CM | POA: Diagnosis not present

## 2019-07-15 DIAGNOSIS — M1712 Unilateral primary osteoarthritis, left knee: Secondary | ICD-10-CM | POA: Diagnosis not present

## 2019-07-15 DIAGNOSIS — M170 Bilateral primary osteoarthritis of knee: Secondary | ICD-10-CM | POA: Diagnosis not present

## 2019-07-22 DIAGNOSIS — M1712 Unilateral primary osteoarthritis, left knee: Secondary | ICD-10-CM | POA: Diagnosis not present

## 2019-07-27 DIAGNOSIS — M25551 Pain in right hip: Secondary | ICD-10-CM | POA: Diagnosis not present

## 2019-07-29 DIAGNOSIS — M1712 Unilateral primary osteoarthritis, left knee: Secondary | ICD-10-CM | POA: Diagnosis not present

## 2019-07-30 DIAGNOSIS — M25551 Pain in right hip: Secondary | ICD-10-CM | POA: Diagnosis not present

## 2019-07-30 DIAGNOSIS — M7061 Trochanteric bursitis, right hip: Secondary | ICD-10-CM | POA: Diagnosis not present

## 2019-07-30 DIAGNOSIS — M1611 Unilateral primary osteoarthritis, right hip: Secondary | ICD-10-CM | POA: Diagnosis not present

## 2019-08-05 DIAGNOSIS — M1712 Unilateral primary osteoarthritis, left knee: Secondary | ICD-10-CM | POA: Diagnosis not present

## 2019-08-06 DIAGNOSIS — Z1231 Encounter for screening mammogram for malignant neoplasm of breast: Secondary | ICD-10-CM | POA: Diagnosis not present

## 2019-08-12 DIAGNOSIS — M1712 Unilateral primary osteoarthritis, left knee: Secondary | ICD-10-CM | POA: Diagnosis not present

## 2019-08-19 DIAGNOSIS — M25551 Pain in right hip: Secondary | ICD-10-CM | POA: Diagnosis not present

## 2019-10-04 ENCOUNTER — Ambulatory Visit: Payer: Medicare Other | Attending: Internal Medicine

## 2019-10-04 DIAGNOSIS — Z23 Encounter for immunization: Secondary | ICD-10-CM | POA: Insufficient documentation

## 2019-10-04 NOTE — Progress Notes (Signed)
   Covid-19 Vaccination Clinic  Name:  KEHINDE OBARA    MRN: [id number] DOB: 09/30/1940  10/04/2019  Ms. Marlette was observed post Covid-19 immunization for 15 minutes without incidence. She was provided with Vaccine Information Sheet and instruction to access the V-Safe system.   Ms. Rossetto was instructed to call 911 with any severe reactions post vaccine: [place] Kitchen Difficulty breathing  . Swelling of your face and throat  . A fast heartbeat  . A bad rash all over your body  . Dizziness and weakness

## 2019-10-19 DIAGNOSIS — N3020 Other chronic cystitis without hematuria: Secondary | ICD-10-CM | POA: Diagnosis not present

## 2019-10-19 DIAGNOSIS — N952 Postmenopausal atrophic vaginitis: Secondary | ICD-10-CM | POA: Diagnosis not present

## 2019-10-22 ENCOUNTER — Ambulatory Visit: Payer: Medicare Other | Attending: Internal Medicine

## 2019-10-22 DIAGNOSIS — Z23 Encounter for immunization: Secondary | ICD-10-CM

## 2019-10-22 NOTE — Progress Notes (Signed)
   Covid-19 Vaccination Clinic  Name:  Gwendolyn Morrow    MRN: [id number] DOB: 1941/02/18  10/22/2019  Gwendolyn Morrow was observed post Covid-19 immunization for 15 minutes without incidence. She was provided with Vaccine Information Sheet and instruction to access the V-Safe system.   Gwendolyn Morrow was instructed to call 911 with any severe reactions post vaccine: [place] Kitchen Difficulty breathing  . Swelling of your face and throat  . A fast heartbeat  . A bad rash all over your body  . Dizziness and weakness    Immunizations Administered    Name Date Dose VIS Date Route   Pfizer COVID-19 Vaccine 10/22/2019  2:39 PM 0.3 mL 08/28/2019 Intramuscular   Manufacturer: [place]   Lot: [id number]   [place]: [id number]

## 2019-11-11 DIAGNOSIS — M25512 Pain in left shoulder: Secondary | ICD-10-CM | POA: Diagnosis not present

## 2019-11-11 DIAGNOSIS — M25511 Pain in right shoulder: Secondary | ICD-10-CM | POA: Diagnosis not present

## 2019-11-11 DIAGNOSIS — M7541 Impingement syndrome of right shoulder: Secondary | ICD-10-CM | POA: Diagnosis not present

## 2019-12-09 DIAGNOSIS — M545 Low back pain: Secondary | ICD-10-CM | POA: Diagnosis not present

## 2019-12-16 DIAGNOSIS — H6123 Impacted cerumen, bilateral: Secondary | ICD-10-CM | POA: Diagnosis not present

## 2019-12-24 DIAGNOSIS — M545 Low back pain: Secondary | ICD-10-CM | POA: Diagnosis not present

## 2019-12-30 DIAGNOSIS — M545 Low back pain: Secondary | ICD-10-CM | POA: Diagnosis not present

## 2020-01-05 DIAGNOSIS — M545 Low back pain: Secondary | ICD-10-CM | POA: Diagnosis not present

## 2020-01-13 DIAGNOSIS — M545 Low back pain: Secondary | ICD-10-CM | POA: Diagnosis not present

## 2020-01-18 DIAGNOSIS — D81818 Other biotin-dependent carboxylase deficiency: Secondary | ICD-10-CM | POA: Diagnosis not present

## 2020-01-18 DIAGNOSIS — Z79899 Other long term (current) drug therapy: Secondary | ICD-10-CM | POA: Diagnosis not present

## 2020-01-18 DIAGNOSIS — E7800 Pure hypercholesterolemia, unspecified: Secondary | ICD-10-CM | POA: Diagnosis not present

## 2020-01-18 DIAGNOSIS — Z0001 Encounter for general adult medical examination with abnormal findings: Secondary | ICD-10-CM | POA: Diagnosis not present

## 2020-01-18 DIAGNOSIS — E559 Vitamin D deficiency, unspecified: Secondary | ICD-10-CM | POA: Diagnosis not present

## 2020-01-18 DIAGNOSIS — K219 Gastro-esophageal reflux disease without esophagitis: Secondary | ICD-10-CM | POA: Diagnosis not present

## 2020-01-18 DIAGNOSIS — I10 Essential (primary) hypertension: Secondary | ICD-10-CM | POA: Diagnosis not present

## 2020-01-19 DIAGNOSIS — M545 Low back pain: Secondary | ICD-10-CM | POA: Diagnosis not present

## 2020-02-02 DIAGNOSIS — D485 Neoplasm of uncertain behavior of skin: Secondary | ICD-10-CM | POA: Diagnosis not present

## 2020-02-02 DIAGNOSIS — D0462 Carcinoma in situ of skin of left upper limb, including shoulder: Secondary | ICD-10-CM | POA: Diagnosis not present

## 2020-02-02 DIAGNOSIS — L570 Actinic keratosis: Secondary | ICD-10-CM | POA: Diagnosis not present

## 2020-03-02 DIAGNOSIS — M1712 Unilateral primary osteoarthritis, left knee: Secondary | ICD-10-CM | POA: Diagnosis not present

## 2020-05-11 DIAGNOSIS — H1132 Conjunctival hemorrhage, left eye: Secondary | ICD-10-CM | POA: Diagnosis not present

## 2020-05-13 DIAGNOSIS — R300 Dysuria: Secondary | ICD-10-CM | POA: Diagnosis not present

## 2020-06-01 DIAGNOSIS — L578 Other skin changes due to chronic exposure to nonionizing radiation: Secondary | ICD-10-CM | POA: Diagnosis not present

## 2020-06-01 DIAGNOSIS — Z85828 Personal history of other malignant neoplasm of skin: Secondary | ICD-10-CM | POA: Diagnosis not present

## 2020-06-01 DIAGNOSIS — L821 Other seborrheic keratosis: Secondary | ICD-10-CM | POA: Diagnosis not present

## 2020-06-01 DIAGNOSIS — D225 Melanocytic nevi of trunk: Secondary | ICD-10-CM | POA: Diagnosis not present

## 2020-06-01 DIAGNOSIS — D3610 Benign neoplasm of peripheral nerves and autonomic nervous system, unspecified: Secondary | ICD-10-CM | POA: Diagnosis not present

## 2020-06-01 DIAGNOSIS — D239 Other benign neoplasm of skin, unspecified: Secondary | ICD-10-CM | POA: Diagnosis not present

## 2020-06-01 DIAGNOSIS — L570 Actinic keratosis: Secondary | ICD-10-CM | POA: Diagnosis not present

## 2020-06-01 DIAGNOSIS — Z86018 Personal history of other benign neoplasm: Secondary | ICD-10-CM | POA: Diagnosis not present

## 2020-06-06 DIAGNOSIS — Z23 Encounter for immunization: Secondary | ICD-10-CM | POA: Diagnosis not present

## 2020-06-07 DIAGNOSIS — I87392 Chronic venous hypertension (idiopathic) with other complications of left lower extremity: Secondary | ICD-10-CM | POA: Diagnosis not present

## 2020-06-15 DIAGNOSIS — Z23 Encounter for immunization: Secondary | ICD-10-CM | POA: Diagnosis not present

## 2020-06-16 DIAGNOSIS — I87392 Chronic venous hypertension (idiopathic) with other complications of left lower extremity: Secondary | ICD-10-CM | POA: Diagnosis not present

## 2020-06-23 DIAGNOSIS — I87393 Chronic venous hypertension (idiopathic) with other complications of bilateral lower extremity: Secondary | ICD-10-CM | POA: Diagnosis not present

## 2020-07-14 DIAGNOSIS — H2513 Age-related nuclear cataract, bilateral: Secondary | ICD-10-CM | POA: Diagnosis not present

## 2020-07-14 DIAGNOSIS — H5203 Hypermetropia, bilateral: Secondary | ICD-10-CM | POA: Diagnosis not present

## 2020-07-14 DIAGNOSIS — H52203 Unspecified astigmatism, bilateral: Secondary | ICD-10-CM | POA: Diagnosis not present

## 2020-07-28 DIAGNOSIS — M79641 Pain in right hand: Secondary | ICD-10-CM | POA: Diagnosis not present

## 2020-07-28 DIAGNOSIS — M13849 Other specified arthritis, unspecified hand: Secondary | ICD-10-CM | POA: Diagnosis not present

## 2020-07-28 DIAGNOSIS — R2230 Localized swelling, mass and lump, unspecified upper limb: Secondary | ICD-10-CM | POA: Diagnosis not present

## 2020-08-16 DIAGNOSIS — J3089 Other allergic rhinitis: Secondary | ICD-10-CM | POA: Diagnosis not present

## 2020-08-16 DIAGNOSIS — J019 Acute sinusitis, unspecified: Secondary | ICD-10-CM | POA: Diagnosis not present

## 2020-08-22 DIAGNOSIS — J019 Acute sinusitis, unspecified: Secondary | ICD-10-CM | POA: Diagnosis not present

## 2020-10-04 ENCOUNTER — Encounter (HOSPITAL_COMMUNITY): Payer: Self-pay | Admitting: Emergency Medicine

## 2020-10-04 ENCOUNTER — Other Ambulatory Visit: Payer: Self-pay

## 2020-10-04 ENCOUNTER — Emergency Department (HOSPITAL_COMMUNITY): Payer: Medicare Other

## 2020-10-04 ENCOUNTER — Inpatient Hospital Stay (HOSPITAL_COMMUNITY)
Admission: EM | Admit: 2020-10-04 | Discharge: 2020-10-07 | DRG: 041 | Disposition: A | Discharge status: Home / Self Care | Payer: Medicare Other | Attending: Internal Medicine | Admitting: Internal Medicine

## 2020-10-04 DIAGNOSIS — I639 Cerebral infarction, unspecified: Secondary | ICD-10-CM | POA: Diagnosis present

## 2020-10-04 DIAGNOSIS — Z20822 Contact with and (suspected) exposure to covid-19: Secondary | ICD-10-CM | POA: Diagnosis not present

## 2020-10-04 DIAGNOSIS — J302 Other seasonal allergic rhinitis: Secondary | ICD-10-CM | POA: Diagnosis present

## 2020-10-04 DIAGNOSIS — Z9049 Acquired absence of other specified parts of digestive tract: Secondary | ICD-10-CM

## 2020-10-04 DIAGNOSIS — Z9851 Tubal ligation status: Secondary | ICD-10-CM

## 2020-10-04 DIAGNOSIS — R5381 Other malaise: Secondary | ICD-10-CM | POA: Diagnosis not present

## 2020-10-04 DIAGNOSIS — I63539 Cerebral infarction due to unspecified occlusion or stenosis of unspecified posterior cerebral artery: Secondary | ICD-10-CM | POA: Diagnosis not present

## 2020-10-04 DIAGNOSIS — Z96651 Presence of right artificial knee joint: Secondary | ICD-10-CM | POA: Diagnosis present

## 2020-10-04 DIAGNOSIS — I10 Essential (primary) hypertension: Secondary | ICD-10-CM | POA: Diagnosis not present

## 2020-10-04 DIAGNOSIS — R202 Paresthesia of skin: Secondary | ICD-10-CM | POA: Diagnosis not present

## 2020-10-04 DIAGNOSIS — Z880 Allergy status to penicillin: Secondary | ICD-10-CM

## 2020-10-04 DIAGNOSIS — Z79899 Other long term (current) drug therapy: Secondary | ICD-10-CM

## 2020-10-04 DIAGNOSIS — I6503 Occlusion and stenosis of bilateral vertebral arteries: Secondary | ICD-10-CM | POA: Diagnosis not present

## 2020-10-04 DIAGNOSIS — I6523 Occlusion and stenosis of bilateral carotid arteries: Secondary | ICD-10-CM | POA: Diagnosis not present

## 2020-10-04 DIAGNOSIS — R000 Tachycardia, unspecified: Secondary | ICD-10-CM | POA: Diagnosis not present

## 2020-10-04 DIAGNOSIS — Z888 Allergy status to other drugs, medicaments and biological substances status: Secondary | ICD-10-CM

## 2020-10-04 DIAGNOSIS — Z91048 Other nonmedicinal substance allergy status: Secondary | ICD-10-CM

## 2020-10-04 DIAGNOSIS — I69351 Hemiplegia and hemiparesis following cerebral infarction affecting right dominant side: Secondary | ICD-10-CM | POA: Diagnosis not present

## 2020-10-04 DIAGNOSIS — R29700 NIHSS score 0: Secondary | ICD-10-CM | POA: Diagnosis present

## 2020-10-04 DIAGNOSIS — R531 Weakness: Secondary | ICD-10-CM | POA: Diagnosis not present

## 2020-10-04 DIAGNOSIS — G4489 Other headache syndrome: Secondary | ICD-10-CM | POA: Diagnosis not present

## 2020-10-04 DIAGNOSIS — K219 Gastro-esophageal reflux disease without esophagitis: Secondary | ICD-10-CM | POA: Diagnosis not present

## 2020-10-04 DIAGNOSIS — E785 Hyperlipidemia, unspecified: Secondary | ICD-10-CM | POA: Diagnosis present

## 2020-10-04 DIAGNOSIS — Z886 Allergy status to analgesic agent status: Secondary | ICD-10-CM

## 2020-10-04 DIAGNOSIS — Z90710 Acquired absence of both cervix and uterus: Secondary | ICD-10-CM

## 2020-10-04 DIAGNOSIS — R52 Pain, unspecified: Secondary | ICD-10-CM | POA: Diagnosis not present

## 2020-10-04 LAB — I-STAT CHEM 8, ED
BUN: 17 mg/dL (ref 8–23)
Calcium, Ion: 1.26 mmol/L (ref 1.15–1.40)
Chloride: 103 mmol/L (ref 98–111)
Creatinine, Ser: 0.8 mg/dL (ref 0.44–1.00)
Glucose, Bld: 100 mg/dL — ABNORMAL HIGH (ref 70–99)
HCT: 46 % (ref 36.0–46.0)
Hemoglobin: 15.6 g/dL — ABNORMAL HIGH (ref 12.0–15.0)
Potassium: 3.6 mmol/L (ref 3.5–5.1)
Sodium: 139 mmol/L (ref 135–145)
TCO2: 28 mmol/L (ref 22–32)

## 2020-10-04 LAB — CBC
HCT: 46.4 % — ABNORMAL HIGH (ref 36.0–46.0)
Hemoglobin: 15.7 g/dL — ABNORMAL HIGH (ref 12.0–15.0)
MCH: 30.3 pg (ref 26.0–34.0)
MCHC: 33.8 g/dL (ref 30.0–36.0)
MCV: 89.4 fL (ref 80.0–100.0)
Platelets: 260 10*3/uL (ref 150–400)
RBC: 5.19 MIL/uL — ABNORMAL HIGH (ref 3.87–5.11)
RDW: 12.1 % (ref 11.5–15.5)
WBC: 8.5 10*3/uL (ref 4.0–10.5)
nRBC: 0 % (ref 0.0–0.2)

## 2020-10-04 LAB — PROTIME-INR
INR: 0.9 (ref 0.8–1.2)
Prothrombin Time: 11.9 seconds (ref 11.4–15.2)

## 2020-10-04 LAB — DIFFERENTIAL
Abs Immature Granulocytes: 0.02 10*3/uL (ref 0.00–0.07)
Basophils Absolute: 0.1 10*3/uL (ref 0.0–0.1)
Basophils Relative: 1 %
Eosinophils Absolute: 0 10*3/uL (ref 0.0–0.5)
Eosinophils Relative: 1 %
Immature Granulocytes: 0 %
Lymphocytes Relative: 19 %
Lymphs Abs: 1.6 10*3/uL (ref 0.7–4.0)
Monocytes Absolute: 0.4 10*3/uL (ref 0.1–1.0)
Monocytes Relative: 5 %
Neutro Abs: 6.4 10*3/uL (ref 1.7–7.7)
Neutrophils Relative %: 74 %

## 2020-10-04 LAB — COMPREHENSIVE METABOLIC PANEL
ALT: 20 U/L (ref 0–44)
AST: 23 U/L (ref 15–41)
Albumin: 4.9 g/dL (ref 3.5–5.0)
Alkaline Phosphatase: 95 U/L (ref 38–126)
Anion gap: 13 (ref 5–15)
BUN: 16 mg/dL (ref 8–23)
CO2: 25 mmol/L (ref 22–32)
Calcium: 10 mg/dL (ref 8.9–10.3)
Chloride: 103 mmol/L (ref 98–111)
Creatinine, Ser: 0.83 mg/dL (ref 0.44–1.00)
GFR, Estimated: >60 mL/min (ref >60)
Glucose, Bld: 103 mg/dL — ABNORMAL HIGH (ref 70–99)
Potassium: 3.6 mmol/L (ref 3.5–5.1)
Sodium: 141 mmol/L (ref 135–145)
Total Bilirubin: 0.8 mg/dL (ref 0.3–1.2)
Total Protein: 8 g/dL (ref 6.5–8.1)

## 2020-10-04 LAB — LIPID PANEL
Cholesterol: 169 mg/dL (ref 0–200)
HDL: 59 mg/dL (ref >40)
LDL Cholesterol: 90 mg/dL (ref 0–99)
Total CHOL/HDL Ratio: 2.9 RATIO
Triglycerides: 101 mg/dL (ref <150)
VLDL: 20 mg/dL (ref 0–40)

## 2020-10-04 LAB — CBG MONITORING, ED: Glucose-Capillary: 117 mg/dL — ABNORMAL HIGH (ref 70–99)

## 2020-10-04 LAB — HEMOGLOBIN A1C
Hgb A1c MFr Bld: 5.3 % (ref 4.8–5.6)
Mean Plasma Glucose: 105.41 mg/dL

## 2020-10-04 LAB — APTT: aPTT: 27 seconds (ref 24–36)

## 2020-10-04 LAB — TROPONIN I (HIGH SENSITIVITY)
Troponin I (High Sensitivity): 6 ng/L (ref <18)
Troponin I (High Sensitivity): 6 ng/L (ref <18)

## 2020-10-04 LAB — MAGNESIUM: Magnesium: 2 mg/dL (ref 1.7–2.4)

## 2020-10-04 MED ORDER — ENOXAPARIN SODIUM 40 MG/0.4ML ~~LOC~~ SOLN
40.0000 mg | SUBCUTANEOUS | Status: DC
  Administered 2020-10-04: 40 mg via SUBCUTANEOUS
  Filled 2020-10-04: qty 0.4

## 2020-10-04 MED ORDER — CLOPIDOGREL BISULFATE 75 MG PO TABS
75.0000 mg | ORAL_TABLET | Freq: Every day | ORAL | Status: DC
  Administered 2020-10-04 – 2020-10-07 (×4): 75 mg via ORAL
  Filled 2020-10-04 (×4): qty 1

## 2020-10-04 MED ORDER — SENNOSIDES-DOCUSATE SODIUM 8.6-50 MG PO TABS: 1.0000 | ORAL_TABLET | Freq: Every evening | ORAL | Status: DC | PRN

## 2020-10-04 MED ORDER — ACETAMINOPHEN 160 MG/5ML PO SOLN: 650.0000 mg | ORAL | Status: DC | PRN

## 2020-10-04 MED ORDER — ACETAMINOPHEN 650 MG RE SUPP: 650.0000 mg | RECTAL | Status: DC | PRN

## 2020-10-04 MED ORDER — SODIUM CHLORIDE 0.9 % IV SOLN: INTRAVENOUS | Status: DC

## 2020-10-04 MED ORDER — SODIUM CHLORIDE 0.9 % IV BOLUS
1000.0000 mL | Freq: Once | INTRAVENOUS | Status: AC
  Administered 2020-10-04: 1000 mL via INTRAVENOUS

## 2020-10-04 MED ORDER — IOHEXOL 350 MG/ML SOLN
75.0000 mL | Freq: Once | INTRAVENOUS | Status: AC | PRN
  Administered 2020-10-04: 75 mL via INTRAVENOUS

## 2020-10-04 MED ORDER — SODIUM CHLORIDE 0.9 % IV SOLN: Freq: Once | INTRAVENOUS | Status: DC

## 2020-10-04 MED ORDER — STROKE: EARLY STAGES OF RECOVERY BOOK
Freq: Once | Status: DC
  Filled 2020-10-04: qty 1

## 2020-10-04 MED ORDER — SODIUM CHLORIDE 0.9 % IV SOLN: INTRAVENOUS | Status: AC

## 2020-10-04 MED ORDER — ACETAMINOPHEN 325 MG PO TABS
650.0000 mg | ORAL_TABLET | ORAL | Status: DC | PRN
  Administered 2020-10-05 – 2020-10-07 (×5): 650 mg via ORAL
  Filled 2020-10-04 (×5): qty 2

## 2020-10-04 NOTE — H&P (Signed)
History and Physical    Gwendolyn Morrow [id number] DOB: 04-03-1941 DOA: 10/04/2020  PCP: Lujean Amel, MD   Patient coming from: Home   Chief Complaint: Right-sided numbness and weakness, difficulty concentrating   HPI: Gwendolyn Morrow is a 80 y.o. female with medical history significant for hypertension, now presenting to the emergency department for evaluation of right-sided numbness, weakness, and some difficulty concentrating.  The patient reports that the symptoms initially developed roughly 3 days ago but lasted only a few minutes at that time.  Today, she developed numbness involving the right upper and lower extremity, right face, and also feels that she has weakness on that side.  Patient also reports some difficulty concentrating, and notes that it was difficult for her to remember which medications she is taking when questioned by the pharmacy tech.  She denies any recent fevers, chills, chest pain, palpitations, abdominal pain, nausea, vomiting, or diarrhea.  ED Course: Upon arrival to the ED, patient is found to be afebrile, saturating well on room air, tachycardic in the 110s initially, and with modest elevation of blood pressure.  EKG features a sinus rhythm.  Chemistry panel is unremarkable and CBC notable for slight polycythemia.  MRI brain reveals patchy small volume acute to early subacute PCA distribution infarction.  CTA head and neck notable for severe stenosis involving the proximal P2 segment of left PCA.  Neurology was consulted by the ED physician and hospitalist asked to admit.  Patient was given Plavix and IV fluids in the ED.  COVID-19 screening test is pending.  Review of Systems:  All other systems reviewed and apart from HPI, are negative.  Past Medical History:  Diagnosis Date  . Arthritis   . Complication of anesthesia   . GERD (gastroesophageal reflux disease)   . H/O seasonal allergies   . Headache   . Hypertension   . PONV (postoperative nausea and  vomiting)     Past Surgical History:  Procedure Laterality Date  . ABDOMINAL HYSTERECTOMY    . APPENDECTOMY     removed during C-section  . BREAST SURGERY     right breast biopsy- benign  . CESAREAN SECTION    . HAND SURGERY     bilateral thumb joints replaced  . TOTAL KNEE ARTHROPLASTY Right 09/24/2016   Procedure: TOTAL KNEE ARTHROPLASTY;  Surgeon: Paralee Cancel, MD;  Location: [facility];  Service: Orthopedics;  Laterality: Right;  . TUBAL LIGATION      Social History:   reports that she has never smoked. She has never used smokeless tobacco. She reports current alcohol use. She reports that she does not use drugs.  Allergies  Allergen Reactions  . Penicillins Anaphylaxis    Has patient had a PCN reaction causing immediate rash, facial/tongue/throat swelling, SOB or lightheadedness with hypotension: Yes Has patient had a PCN reaction causing severe rash involving mucus membranes or skin necrosis: No Has patient had a PCN reaction that required hospitalization Yes Has patient had a PCN reaction occurring within the last 10 years: No If all of the above answers are "NO", then may proceed with Cephalosporin use.   . Enalapril Cough  . Excedrin Extra Strength [Asa-Apap-Caff Buffered] Hives  . Excedrin Tension Headache [Acetaminophen-Caffeine] Hives  . Tyloxapol Hives    History reviewed. No pertinent family history.   Prior to Admission medications   Medication Sig Start Date End Date Taking? Authorizing Provider  amLODipine (NORVASC) 10 MG tablet Take 10 mg by mouth daily.   Yes [provider]  Ascorbic Acid (VITAMIN C) 1000 MG tablet Take 1,000 mg by mouth every evening.   Yes [provider]  atorvastatin (LIPITOR) 20 MG tablet Take 20 mg by mouth daily at 6 PM.   Yes [provider]  BIOTIN PO Take 15 mg by mouth daily.   Yes [provider]  calcium carbonate (OSCAL) 1500 (600 Ca) MG TABS tablet Take 600 mg of elemental calcium by mouth  daily with breakfast.   Yes [provider]  Multiple Vitamin (MULTIVITAMIN WITH MINERALS) TABS tablet Take 1 tablet by mouth daily.   Yes [provider]  OMEGA-3 KRILL OIL PO Take 350 mg by mouth every evening.   Yes [provider]  omeprazole (PRILOSEC) 20 MG capsule Take 20 mg by mouth daily.   Yes [provider]  potassium chloride SA (KLOR-CON) 20 MEQ tablet Take 20 mEq by mouth daily. 08/15/20  Yes [provider]  triamterene-hydrochlorothiazide (MAXZIDE-25) 37.5-25 MG tablet Take 1 tablet by mouth daily.   Yes [provider]  Turmeric 500 MG CAPS Take 500 mg by mouth every evening.   Yes [provider]  vitamin B-12 (CYANOCOBALAMIN) 500 MCG tablet Take 500 mcg by mouth 2 (two) times a week. Take on Mon & Fri   Yes [provider]  acyclovir (ZOVIRAX) 400 MG tablet Take 400 mg by mouth 3 (three) times daily as needed (fever blisters).  07/12/16   [provider]  docusate sodium (COLACE) 100 MG capsule Take 1 capsule (100 mg total) by mouth 2 (two) times daily. Patient not taking: No sig reported 09/26/16   Gwendolyn Orleans, PA-C  ferrous sulfate 325 (65 FE) MG tablet Take 1 tablet (325 mg total) by mouth 3 (three) times daily after meals. Patient not taking: No sig reported 09/26/16   Gwendolyn Orleans, PA-C  fexofenadine (ALLEGRA) 180 MG tablet Take 180 mg by mouth daily as needed for allergies.    [provider]  HYDROcodone-acetaminophen (NORCO) 7.5-325 MG tablet Take 1-2 tablets by mouth every 4 (four) hours as needed for moderate pain. Patient not taking: No sig reported 09/26/16   Gwendolyn Orleans, PA-C  methocarbamol (ROBAXIN) 500 MG tablet Take 1 tablet (500 mg total) by mouth every 6 (six) hours as needed for muscle spasms. Patient not taking: No sig reported 09/26/16   Gwendolyn Orleans, PA-C  polyethylene glycol (MIRALAX / GLYCOLAX) packet Take 17 g by mouth 2 (two) times daily. Patient not  taking: No sig reported 09/26/16   Gwendolyn Orleans, PA-C    Physical Exam: Vitals:   10/04/20 1324 10/04/20 1608 10/04/20 1800 10/04/20 1856  BP: (!) 147/79 (!) 143/92 (!) 141/87 (!) 165/78  Pulse: 94 (!) 115 100 89  Resp: 16 16 18 16   Temp: 98.5 F (36.9 C) 97.7 F (36.5 C)  98 F (36.7 C)  TempSrc: Oral Oral  Oral  SpO2: 96% 99% 99% 96%  Weight:      Height:        Constitutional: NAD, calm  Eyes: PERTLA, lids and conjunctivae normal ENMT: Mucous membranes are moist. Posterior pharynx clear of any exudate or lesions.   Neck: normal, supple, no masses, no thyromegaly Respiratory: no wheezing, no crackles. No accessory muscle use.  Cardiovascular: S1 & S2 heard, regular rate and rhythm. No extremity edema.   Abdomen: No distension, no tenderness, soft. Bowel sounds active.  Musculoskeletal: no clubbing / cyanosis. No joint deformity upper and lower extremities.   Skin: no significant rashes,  lesions, ulcers. Warm, dry, well-perfused. Neurologic: CN 2-12 grossly intact. Sensation diminished in RUE and RLE. Strength 5/5 in all 4 limbs.  Psychiatric: Alert and oriented to person, place, and situation. Very pleasant and cooperative.    Labs and Imaging on Admission: I have personally reviewed following labs and imaging studies  CBC: Recent Labs  Lab 10/04/20 1321 10/04/20 1353  WBC 8.5  --   NEUTROABS 6.4  --   HGB 15.7* 15.6*  HCT 46.4* 46.0  MCV 89.4  --   PLT 260  --    Basic Metabolic Panel: Recent Labs  Lab 10/04/20 1321 10/04/20 1353 10/04/20 1800  NA 141 139  --   K 3.6 3.6  --   CL 103 103  --   CO2 25  --   --   GLUCOSE 103* 100*  --   BUN 16 17  --   CREATININE 0.83 0.80  --   CALCIUM 10.0  --   --   MG  --   --  2.0   GFR: Estimated Creatinine Clearance: 56.2 mL/min (by C-G formula based on SCr of 0.8 mg/dL). Liver Function Tests: Recent Labs  Lab 10/04/20 1321  AST 23  ALT 20  ALKPHOS 95  BILITOT 0.8  PROT 8.0  ALBUMIN 4.9   No results  for input(s): LIPASE, AMYLASE in the last 168 hours. No results for input(s): AMMONIA in the last 168 hours. Coagulation Profile: Recent Labs  Lab 10/04/20 1321  INR 0.9   Cardiac Enzymes: No results for input(s): CKTOTAL, CKMB, CKMBINDEX, TROPONINI in the last 168 hours. BNP (last 3 results) No results for input(s): PROBNP in the last 8760 hours. HbA1C: No results for input(s): HGBA1C in the last 72 hours. CBG: Recent Labs  Lab 10/04/20 1126  GLUCAP 117*   Lipid Profile: Recent Labs    10/04/20 1932  CHOL 169  HDL 59  LDLCALC 90  TRIG 101  CHOLHDL 2.9   Thyroid Function Tests: No results for input(s): TSH, T4TOTAL, FREET4, T3FREE, THYROIDAB in the last 72 hours. Anemia Panel: No results for input(s): VITAMINB12, FOLATE, FERRITIN, TIBC, IRON, RETICCTPCT in the last 72 hours. Urine analysis: No results found for: COLORURINE, APPEARANCEUR, LABSPEC, PHURINE, GLUCOSEU, HGBUR, BILIRUBINUR, KETONESUR, PROTEINUR, UROBILINOGEN, NITRITE, LEUKOCYTESUR Sepsis Labs: @LABRCNTIP (procalcitonin:4,lacticidven:4) )No results found for this or any previous visit (from the past 240 hour(s)).   Radiological Exams on Admission: CT ANGIO HEAD W OR WO CONTRAST  Result Date: 10/04/2020 CLINICAL DATA:  Neuro deficit, acute, stroke suspected. Additional history provided: Patient reports intermittent numbness and tingling in left arm, leg and mouth for 3 days. EXAM: CT ANGIOGRAPHY HEAD AND NECK TECHNIQUE: Multidetector CT imaging of the head and neck was performed using the standard protocol during bolus administration of intravenous contrast. Multiplanar CT image reconstructions and MIPs were obtained to evaluate the vascular anatomy. Carotid stenosis measurements (when applicable) are obtained utilizing NASCET criteria, using the distal internal carotid diameter as the denominator. CONTRAST:  23mL OMNIPAQUE IOHEXOL 350 MG/ML SOLN COMPARISON:  Brain MRI performed earlier the same day 10/04/2020.  FINDINGS: CT HEAD FINDINGS Brain: Patchy small volume cortical and subcortical acute/early subacute infarcts within the left PCA territory involving the left temporal and occipital lobes, better appreciated on the brain MRI performed earlier today. Redemonstrated associated acute/early subacute infarct within the left thalamus. No evidence of hemorrhagic conversion. No extra-axial fluid collection. No evidence of intracranial mass. No midline shift. Vascular: No hyperdense vessel.  Atherosclerotic calcifications. Skull: Normal. Negative for  fracture or focal lesion. Sinuses: Mild bilateral ethmoid mucosal thickening. Orbits: No mass or acute finding. Review of the MIP images confirms the above findings CTA NECK FINDINGS Aortic arch: Common origin of the innominate and left common carotid arteries. Atherosclerotic plaque within the visualized aortic arch and proximal major branch vessels of the neck. No hemodynamically significant innominate or proximal subclavian artery stenosis. Right carotid system: CCA and ICA patent within the neck without significant stenosis (50% or greater). Mild soft and calcified plaque within the carotid bifurcation and proximal ICA. Left carotid system: CCA and ICA patent within the neck without significant stenosis (50% or greater). Mild soft plaque within the proximal to mid CCA. Mild soft and calcified plaque within the carotid bifurcation and proximal ICA. Tortuosity and ectasia of the mid to distal cervical ICA, which measures up to 8 mm in diameter. Vertebral arteries: Patent within the neck bilaterally. The left vertebral artery is slightly dominant. Nonstenotic atherosclerotic plaque at the origin of both vertebral arteries. Skeleton: No acute bony abnormality or aggressive osseous lesion. Cervical spondylosis with multilevel disc space narrowing, disc bulges, uncovertebral hypertrophy and facet arthrosis. Disc space narrowing is moderate to moderately advanced at C6-C7. Fusion  across the right C2-C3 facet joint. Grade 1 anterolisthesis at C3-C4, C4-C5, C5-C6 and C7-T1. Other neck: No neck mass or cervical lymphadenopathy. Upper chest: No consolidation within the imaged lung apices. Review of the MIP images confirms the above findings CTA HEAD FINDINGS Anterior circulation: The intracranial internal carotid arteries are patent. Nonstenotic calcified plaque within both vessels. The M1 middle cerebral arteries are patent. No M2 proximal branch occlusion or high-grade proximal stenosis is identified. Markedly hypoplastic or developmentally absent A1 right ACA. The anterior cerebral arteries are otherwise patent. No intracranial aneurysm is identified. Posterior circulation: The non dominant right vertebral artery is patent and appears to terminate predominantly as the right PICA. The dominant intracranial left vertebral artery is patent. The basilar artery is patent. Predominantly fetal origin right posterior cerebral artery. A sizable left posterior communicating artery is also present. The posterior cerebral arteries are patent. Severe stenosis within the proximal P2 left PCA (series 17, image 21). Venous sinuses: Within the limitations of contrast timing, no convincing thrombus. Anatomic variants: As described Review of the MIP images confirms the above findings IMPRESSION: CT head: Patchy small-volume acute/early subacute infarcts within the left temporal occipital lobes as well as left thalamus (left PCA vascular territory), some of which were better appreciated on the MRI performed earlier today. No evidence of hemorrhagic conversion. CTA neck: 1. The common and internal carotid arteries are patent within the neck without hemodynamically significant stenosis. Mild atherosclerotic plaque within both carotid systems as described. 2. Nonspecific tortuosity and ectasia of the mid to distal cervical left ICA, which measures up to 8 mm in diameter. 3. Vertebral arteries patent within the neck  bilaterally. Nonstenotic calcified plaque at the origins of both vessels. 4.  Aortic Atherosclerosis (ICD10-I70.0). CTA head: 1. Severe stenosis within the proximal P2 segment of the left posterior cerebral artery. 2. Nonstenotic calcified plaque within the intracranial internal carotid arteries bilaterally. Electronically Signed   By: Kellie Simmering DO   On: 10/04/2020 20:21   CT ANGIO NECK W OR WO CONTRAST  Result Date: 10/04/2020 CLINICAL DATA:  Neuro deficit, acute, stroke suspected. Additional history provided: Patient reports intermittent numbness and tingling in left arm, leg and mouth for 3 days. EXAM: CT ANGIOGRAPHY HEAD AND NECK TECHNIQUE: Multidetector CT imaging of the head and neck was  performed using the standard protocol during bolus administration of intravenous contrast. Multiplanar CT image reconstructions and MIPs were obtained to evaluate the vascular anatomy. Carotid stenosis measurements (when applicable) are obtained utilizing NASCET criteria, using the distal internal carotid diameter as the denominator. CONTRAST:  65mL OMNIPAQUE IOHEXOL 350 MG/ML SOLN COMPARISON:  Brain MRI performed earlier the same day 10/04/2020. FINDINGS: CT HEAD FINDINGS Brain: Patchy small volume cortical and subcortical acute/early subacute infarcts within the left PCA territory involving the left temporal and occipital lobes, better appreciated on the brain MRI performed earlier today. Redemonstrated associated acute/early subacute infarct within the left thalamus. No evidence of hemorrhagic conversion. No extra-axial fluid collection. No evidence of intracranial mass. No midline shift. Vascular: No hyperdense vessel.  Atherosclerotic calcifications. Skull: Normal. Negative for fracture or focal lesion. Sinuses: Mild bilateral ethmoid mucosal thickening. Orbits: No mass or acute finding. Review of the MIP images confirms the above findings CTA NECK FINDINGS Aortic arch: Common origin of the innominate and left  common carotid arteries. Atherosclerotic plaque within the visualized aortic arch and proximal major branch vessels of the neck. No hemodynamically significant innominate or proximal subclavian artery stenosis. Right carotid system: CCA and ICA patent within the neck without significant stenosis (50% or greater). Mild soft and calcified plaque within the carotid bifurcation and proximal ICA. Left carotid system: CCA and ICA patent within the neck without significant stenosis (50% or greater). Mild soft plaque within the proximal to mid CCA. Mild soft and calcified plaque within the carotid bifurcation and proximal ICA. Tortuosity and ectasia of the mid to distal cervical ICA, which measures up to 8 mm in diameter. Vertebral arteries: Patent within the neck bilaterally. The left vertebral artery is slightly dominant. Nonstenotic atherosclerotic plaque at the origin of both vertebral arteries. Skeleton: No acute bony abnormality or aggressive osseous lesion. Cervical spondylosis with multilevel disc space narrowing, disc bulges, uncovertebral hypertrophy and facet arthrosis. Disc space narrowing is moderate to moderately advanced at C6-C7. Fusion across the right C2-C3 facet joint. Grade 1 anterolisthesis at C3-C4, C4-C5, C5-C6 and C7-T1. Other neck: No neck mass or cervical lymphadenopathy. Upper chest: No consolidation within the imaged lung apices. Review of the MIP images confirms the above findings CTA HEAD FINDINGS Anterior circulation: The intracranial internal carotid arteries are patent. Nonstenotic calcified plaque within both vessels. The M1 middle cerebral arteries are patent. No M2 proximal branch occlusion or high-grade proximal stenosis is identified. Markedly hypoplastic or developmentally absent A1 right ACA. The anterior cerebral arteries are otherwise patent. No intracranial aneurysm is identified. Posterior circulation: The non dominant right vertebral artery is patent and appears to terminate  predominantly as the right PICA. The dominant intracranial left vertebral artery is patent. The basilar artery is patent. Predominantly fetal origin right posterior cerebral artery. A sizable left posterior communicating artery is also present. The posterior cerebral arteries are patent. Severe stenosis within the proximal P2 left PCA (series 17, image 21). Venous sinuses: Within the limitations of contrast timing, no convincing thrombus. Anatomic variants: As described Review of the MIP images confirms the above findings IMPRESSION: CT head: Patchy small-volume acute/early subacute infarcts within the left temporal occipital lobes as well as left thalamus (left PCA vascular territory), some of which were better appreciated on the MRI performed earlier today. No evidence of hemorrhagic conversion. CTA neck: 1. The common and internal carotid arteries are patent within the neck without hemodynamically significant stenosis. Mild atherosclerotic plaque within both carotid systems as described. 2. Nonspecific tortuosity and ectasia of  the mid to distal cervical left ICA, which measures up to 8 mm in diameter. 3. Vertebral arteries patent within the neck bilaterally. Nonstenotic calcified plaque at the origins of both vessels. 4.  Aortic Atherosclerosis (ICD10-I70.0). CTA head: 1. Severe stenosis within the proximal P2 segment of the left posterior cerebral artery. 2. Nonstenotic calcified plaque within the intracranial internal carotid arteries bilaterally. Electronically Signed   By: Kellie Simmering DO   On: 10/04/2020 20:21   MR BRAIN WO CONTRAST  Result Date: 10/04/2020 CLINICAL DATA:  Initial evaluation for acute TIA, right-sided numbness and tingling. Weakness. EXAM: MRI HEAD WITHOUT CONTRAST TECHNIQUE: Multiplanar, multiecho pulse sequences of the brain and surrounding structures were obtained without intravenous contrast. COMPARISON:  Previous MRI from 01/21/2013. FINDINGS: Brain: Cerebral volume within normal  limits for age. No significant cerebral white matter disease. Patchy multifocal areas of small volume restricted diffusion seen involving the parasagittal left temporal occipital region, consistent with an acute to early subacute left PCA distribution infarct (series 5, image 76). Subtle patchy involvement of the left thalamus noted. No associated hemorrhage or mass effect. No other diffusion abnormality to suggest acute or subacute ischemia elsewhere within the brain. Gray-white matter differentiation otherwise maintained. No encephalomalacia to suggest chronic cortical infarction elsewhere. No foci of susceptibility artifact to suggest acute or chronic intracranial hemorrhage. No mass lesion, midline shift or mass effect. No hydrocephalus or extra-axial fluid collection. Pituitary gland suprasellar region normal. Midline structures intact. Vascular: Major intracranial vascular flow voids are maintained. Increased FLAIR signal intensity without associated T1 correlate within the left transverse and sigmoid sinus is likely related to slow/sluggish flow. No frank imaging findings to suggest dural venous thrombosis. Skull and upper cervical spine: Craniocervical junction within normal limits. Bone marrow signal intensity normal. No scalp soft tissue abnormality. Sinuses/Orbits: Globes and orbital soft tissues within normal limits. Paranasal sinuses are clear. No mastoid effusion. Inner ear structures normal. Other: None. IMPRESSION: 1. Patchy small volume acute to early subacute left PCA distribution infarct as above. No associated hemorrhage or mass effect. 2. Otherwise normal brain MRI for age. Electronically Signed   By: Jeannine Boga M.D.   On: 10/04/2020 19:07    EKG: Independently reviewed. Sinus rhythm.   Assessment/Plan   1. Acute ischemic CVA  - Presents with right-sided numbness, some right-side weakness as well, and also reports difficulty concentrating, initially beginning 3 days ago but  then resolved after a few minutes until tonight  - MRI brain reveals patchy small-volume acute to early subacute left PCA distribution infarction without mass-effect or hemorrhage  - CTA head and neck reveals high-grade stenosis involving left PCA, proximal P2 segment   - Neurology consulting and much appreciated  - Plavix was given in ED  - Keep HOB flat, continue IVF, bed-rest, permissive HTN, frequent neuro checks, check echo, lipids and A1c   2. Hypertension  - Hold antihypertensives initially     DVT prophylaxis: Lovenox  Code Status: Full  Family Communication: Discussed with patient  Disposition Plan:  Patient is from: home  Anticipated d/c is to: Home  Anticipated d/c date is: 1/19 or 10/06/20 Patient currently: Pending neurology consultation, CTA head and neck, cardiac monitoring, echo, therapy assessments   Consults called: Neurology  Admission status: Observation      Vianne Bulls, MD [facility]  10/04/2020, 8:31 PM

## 2020-10-04 NOTE — ED Notes (Signed)
Patient has a gold top in the main lab 

## 2020-10-04 NOTE — ED Provider Notes (Signed)
[facility] DEPT Provider Note   CSN: [id number] Arrival date & time: 10/04/20  1111     History Chief Complaint  Patient presents with  . Extremity Weakness  . Headache    Gwendolyn Morrow is a 80 y.o. female.  She is here with a complaint of feeling nauseous and generalized weakness that began this morning.  No vomiting.  3 days ago she said she had 5 minutes of right-sided numbness in her arm trunk and leg.  Recurred again 2 days ago.  Today she thought she had some vision difficulty where she could not see the end of the line of text.  She feels some numbness around her mouth today.  No focal weakness today.  No chest or abdominal pain.  No vomiting or diarrhea.  No urine symptoms.  No change in medications.  Few days of a frontal headache that she blames on his sinus condition and is responsive to Tylenol.  No balance issues.  No speech issues  The history is provided by the patient.  Weakness Severity:  Moderate Onset quality:  Gradual Timing:  Constant Progression:  Unchanged Chronicity:  New Relieved by:  Nothing Worsened by:  Nothing Ineffective treatments:  None tried Associated symptoms: extremity numbness, headaches, nausea, sensory-motor deficit, stroke symptoms and vision change   Associated symptoms: no abdominal pain, no aphasia, no ataxia, no chest pain, no cough, no diarrhea, no difficulty walking, no dysuria, no falls, no fever, no foul-smelling urine, no frequency, no loss of consciousness, no shortness of breath and no vomiting        Past Medical History:  Diagnosis Date  . Arthritis   . Complication of anesthesia   . GERD (gastroesophageal reflux disease)   . H/O seasonal allergies   . Headache   . Hypertension   . PONV (postoperative nausea and vomiting)     Patient Active Problem List   Diagnosis Date Noted  . S/P right TKA 09/24/2016  . S/P knee replacement 09/24/2016    Past Surgical History:  Procedure Laterality  Date  . ABDOMINAL HYSTERECTOMY    . APPENDECTOMY     removed during C-section  . BREAST SURGERY     right breast biopsy- benign  . CESAREAN SECTION    . HAND SURGERY     bilateral thumb joints replaced  . TOTAL KNEE ARTHROPLASTY Right 09/24/2016   Procedure: TOTAL KNEE ARTHROPLASTY;  Surgeon: Paralee Cancel, MD;  Location: [facility];  Service: Orthopedics;  Laterality: Right;  . TUBAL LIGATION       OB History   No obstetric history on file.     No family history on file.  Social History   Tobacco Use  . Smoking status: Never Smoker  . Smokeless tobacco: Never Used  Substance Use Topics  . Alcohol use: Yes    Comment: rarely  . Drug use: No    Home Medications Prior to Admission medications   Medication Sig Start Date End Date Taking? Authorizing Provider  acyclovir (ZOVIRAX) 400 MG tablet Take 400 mg by mouth 3 (three) times daily as needed (fever blisters).  07/12/16   [provider]  amLODipine (NORVASC) 10 MG tablet Take 10 mg by mouth daily.    [provider]  Ascorbic Acid (VITAMIN C) 1000 MG tablet Take 1,000 mg by mouth every evening.    [provider]  atorvastatin (LIPITOR) 20 MG tablet Take 20 mg by mouth daily at 6 PM.    [provider]  BIOTIN PO Take 15 mg by mouth daily.    [provider]  calcium carbonate (OSCAL) 1500 (600 Ca) MG TABS tablet Take 600 mg of elemental calcium by mouth every evening.    [provider]  docusate sodium (COLACE) 100 MG capsule Take 1 capsule (100 mg total) by mouth 2 (two) times daily. Patient not taking: Reported on 11/28/2016 09/26/16   Danae Orleans, PA-C  ferrous sulfate 325 (65 FE) MG tablet Take 1 tablet (325 mg total) by mouth 3 (three) times daily after meals. Patient not taking: Reported on 11/28/2016 09/26/16   Danae Orleans, PA-C  fexofenadine (ALLEGRA) 180 MG tablet Take 180 mg by mouth daily.    [provider]  HYDROcodone-acetaminophen (NORCO)  7.5-325 MG tablet Take 1-2 tablets by mouth every 4 (four) hours as needed for moderate pain. Patient not taking: Reported on 11/28/2016 09/26/16   Danae Orleans, PA-C  methocarbamol (ROBAXIN) 500 MG tablet Take 1 tablet (500 mg total) by mouth every 6 (six) hours as needed for muscle spasms. Patient not taking: Reported on 11/28/2016 09/26/16   Danae Orleans, PA-C  Multiple Vitamin (MULTIVITAMIN WITH MINERALS) TABS tablet Take 1 tablet by mouth every evening.    [provider]  OMEGA-3 KRILL OIL PO Take 350 mg by mouth every evening.    [provider]  omeprazole (PRILOSEC) 20 MG capsule Take 20 mg by mouth daily.    [provider]  polyethylene glycol (MIRALAX / GLYCOLAX) packet Take 17 g by mouth 2 (two) times daily. 09/26/16   Danae Orleans, PA-C  potassium chloride (KLOR-CON) 20 MEQ packet Take 20 mEq by mouth daily.    [provider]  triamterene-hydrochlorothiazide (MAXZIDE-25) 37.5-25 MG tablet Take 1 tablet by mouth daily.    [provider]  Turmeric 500 MG CAPS Take 500 mg by mouth every evening.    [provider]  vitamin B-12 (CYANOCOBALAMIN) 500 MCG tablet Take 500 mcg by mouth every other day.    [provider]    Allergies    Penicillins, Excedrin extra strength [asa-apap-caff buffered], and Tyloxapol  Review of Systems   Review of Systems  Constitutional: Positive for fatigue. Negative for fever.  HENT: Negative for sore throat.   Eyes: Positive for visual disturbance.  Respiratory: Negative for cough and shortness of breath.   Cardiovascular: Negative for chest pain.  Gastrointestinal: Positive for nausea. Negative for abdominal pain, diarrhea and vomiting.  Genitourinary: Negative for dysuria and frequency.  Musculoskeletal: Negative for falls and neck pain.  Skin: Negative for rash.  Neurological: Positive for weakness, numbness and headaches. Negative for loss of consciousness, facial asymmetry and  speech difficulty.    Physical Exam Updated Vital Signs BP (!) 143/92 (BP Location: Left Arm)   Pulse (!) 115   Temp 97.7 F (36.5 C) (Oral)   Resp 16   Ht 5' 6.5" (1.689 m)   Wt 61.2 kg   SpO2 99%   BMI 21.46 kg/m   Physical Exam Vitals and nursing note reviewed.  Constitutional:      General: She is not in acute distress.    Appearance: She is well-developed and well-nourished.  HENT:     Head: Normocephalic and atraumatic.  Eyes:     Extraocular Movements: Extraocular movements intact.     Conjunctiva/sclera: Conjunctivae normal.     Pupils: Pupils are equal, round, and reactive to light.  Cardiovascular:     Rate and Rhythm: Normal rate and regular  rhythm.     Heart sounds: Normal heart sounds. No murmur heard.   Pulmonary:     Effort: Pulmonary effort is normal. No respiratory distress.     Breath sounds: Normal breath sounds.  Abdominal:     Palpations: Abdomen is soft. There is no mass.     Tenderness: There is no abdominal tenderness. There is no guarding.  Musculoskeletal:        General: No edema.     Cervical back: Neck supple.  Skin:    General: Skin is warm and dry.     Capillary Refill: Capillary refill takes less than 2 seconds.  Neurological:     Mental Status: She is alert.     GCS: GCS eye subscore is 4. GCS verbal subscore is 5. GCS motor subscore is 6.     Cranial Nerves: No cranial nerve deficit, dysarthria or facial asymmetry.     Sensory: No sensory deficit.     Motor: No weakness.     Gait: Gait normal.  Psychiatric:        Mood and Affect: Mood and affect normal.     ED Results / Procedures / Treatments   Labs (all labs ordered are listed, but only abnormal results are displayed) Labs Reviewed  CBC - Abnormal; Notable for the following components:      Result Value   RBC 5.19 (*)    Hemoglobin 15.7 (*)    HCT 46.4 (*)    All other components within normal limits  COMPREHENSIVE METABOLIC PANEL - Abnormal; Notable for the  following components:   Glucose, Bld 103 (*)    All other components within normal limits  I-STAT CHEM 8, ED - Abnormal; Notable for the following components:   Glucose, Bld 100 (*)    Hemoglobin 15.6 (*)    All other components within normal limits  CBG MONITORING, ED - Abnormal; Notable for the following components:   Glucose-Capillary 117 (*)    All other components within normal limits  SARS CORONAVIRUS 2 (TAT 6-24 HRS)  PROTIME-INR  APTT  DIFFERENTIAL  MAGNESIUM  HEMOGLOBIN A1C  LIPID PANEL  URINALYSIS, ROUTINE W REFLEX MICROSCOPIC  TROPONIN I (HIGH SENSITIVITY)  TROPONIN I (HIGH SENSITIVITY)    EKG EKG Interpretation  Date/Time:  Tuesday October 04 2020 11:25:28 EST Ventricular Rate:  112 PR Interval:    QRS Duration: 77 QT Interval:  329 QTC Calculation: 449 R Axis:   80 Text Interpretation: Sinus tachycardia Biatrial enlargement Nonspecific repol abnormality, diffuse leads Baseline wander in lead(s) V4 12 Lead; Mason-L[facility]ar Confirmed by Dewaine Conger [phone]) on 10/04/2020 11:29:50 AM   Radiology CT ANGIO HEAD W OR WO CONTRAST  Result Date: 10/04/2020 CLINICAL DATA:  Neuro deficit, acute, stroke suspected. Additional history provided: Patient reports intermittent numbness and tingling in left arm, leg and mouth for 3 days. EXAM: CT ANGIOGRAPHY HEAD AND NECK TECHNIQUE: Multidetector CT imaging of the head and neck was performed using the standard protocol during bolus administration of intravenous contrast. Multiplanar CT image reconstructions and MIPs were obtained to evaluate the vascular anatomy. Carotid stenosis measurements (when applicable) are obtained utilizing NASCET criteria, using the distal internal carotid diameter as the denominator. CONTRAST:  13mL OMNIPAQUE IOHEXOL 350 MG/ML SOLN COMPARISON:  Brain MRI performed earlier the same day 10/04/2020. FINDINGS: CT HEAD FINDINGS Brain: Patchy small volume cortical and subcortical acute/early subacute infarcts within the  left PCA territory involving the left temporal and occipital lobes, better appreciated on the brain MRI performed  earlier today. Redemonstrated associated acute/early subacute infarct within the left thalamus. No evidence of hemorrhagic conversion. No extra-axial fluid collection. No evidence of intracranial mass. No midline shift. Vascular: No hyperdense vessel.  Atherosclerotic calcifications. Skull: Normal. Negative for fracture or focal lesion. Sinuses: Mild bilateral ethmoid mucosal thickening. Orbits: No mass or acute finding. Review of the MIP images confirms the above findings CTA NECK FINDINGS Aortic arch: Common origin of the innominate and left common carotid arteries. Atherosclerotic plaque within the visualized aortic arch and proximal major branch vessels of the neck. No hemodynamically significant innominate or proximal subclavian artery stenosis. Right carotid system: CCA and ICA patent within the neck without significant stenosis (50% or greater). Mild soft and calcified plaque within the carotid bifurcation and proximal ICA. Left carotid system: CCA and ICA patent within the neck without significant stenosis (50% or greater). Mild soft plaque within the proximal to mid CCA. Mild soft and calcified plaque within the carotid bifurcation and proximal ICA. Tortuosity and ectasia of the mid to distal cervical ICA, which measures up to 8 mm in diameter. Vertebral arteries: Patent within the neck bilaterally. The left vertebral artery is slightly dominant. Nonstenotic atherosclerotic plaque at the origin of both vertebral arteries. Skeleton: No acute bony abnormality or aggressive osseous lesion. Cervical spondylosis with multilevel disc space narrowing, disc bulges, uncovertebral hypertrophy and facet arthrosis. Disc space narrowing is moderate to moderately advanced at C6-C7. Fusion across the right C2-C3 facet joint. Grade 1 anterolisthesis at C3-C4, C4-C5, C5-C6 and C7-T1. Other neck: No neck mass or  cervical lymphadenopathy. Upper chest: No consolidation within the imaged lung apices. Review of the MIP images confirms the above findings CTA HEAD FINDINGS Anterior circulation: The intracranial internal carotid arteries are patent. Nonstenotic calcified plaque within both vessels. The M1 middle cerebral arteries are patent. No M2 proximal branch occlusion or high-grade proximal stenosis is identified. Markedly hypoplastic or developmentally absent A1 right ACA. The anterior cerebral arteries are otherwise patent. No intracranial aneurysm is identified. Posterior circulation: The non dominant right vertebral artery is patent and appears to terminate predominantly as the right PICA. The dominant intracranial left vertebral artery is patent. The basilar artery is patent. Predominantly fetal origin right posterior cerebral artery. A sizable left posterior communicating artery is also present. The posterior cerebral arteries are patent. Severe stenosis within the proximal P2 left PCA (series 17, image 21). Venous sinuses: Within the limitations of contrast timing, no convincing thrombus. Anatomic variants: As described Review of the MIP images confirms the above findings IMPRESSION: CT head: Patchy small-volume acute/early subacute infarcts within the left temporal occipital lobes as well as left thalamus (left PCA vascular territory), some of which were better appreciated on the MRI performed earlier today. No evidence of hemorrhagic conversion. CTA neck: 1. The common and internal carotid arteries are patent within the neck without hemodynamically significant stenosis. Mild atherosclerotic plaque within both carotid systems as described. 2. Nonspecific tortuosity and ectasia of the mid to distal cervical left ICA, which measures up to 8 mm in diameter. 3. Vertebral arteries patent within the neck bilaterally. Nonstenotic calcified plaque at the origins of both vessels. 4.  Aortic Atherosclerosis (ICD10-I70.0). CTA  head: 1. Severe stenosis within the proximal P2 segment of the left posterior cerebral artery. 2. Nonstenotic calcified plaque within the intracranial internal carotid arteries bilaterally. Electronically Signed   By: Kellie Simmering DO   On: 10/04/2020 20:21   CT ANGIO NECK W OR WO CONTRAST  Result Date: 10/04/2020 CLINICAL DATA:  Neuro deficit, acute, stroke suspected. Additional history provided: Patient reports intermittent numbness and tingling in left arm, leg and mouth for 3 days. EXAM: CT ANGIOGRAPHY HEAD AND NECK TECHNIQUE: Multidetector CT imaging of the head and neck was performed using the standard protocol during bolus administration of intravenous contrast. Multiplanar CT image reconstructions and MIPs were obtained to evaluate the vascular anatomy. Carotid stenosis measurements (when applicable) are obtained utilizing NASCET criteria, using the distal internal carotid diameter as the denominator. CONTRAST:  73mL OMNIPAQUE IOHEXOL 350 MG/ML SOLN COMPARISON:  Brain MRI performed earlier the same day 10/04/2020. FINDINGS: CT HEAD FINDINGS Brain: Patchy small volume cortical and subcortical acute/early subacute infarcts within the left PCA territory involving the left temporal and occipital lobes, better appreciated on the brain MRI performed earlier today. Redemonstrated associated acute/early subacute infarct within the left thalamus. No evidence of hemorrhagic conversion. No extra-axial fluid collection. No evidence of intracranial mass. No midline shift. Vascular: No hyperdense vessel.  Atherosclerotic calcifications. Skull: Normal. Negative for fracture or focal lesion. Sinuses: Mild bilateral ethmoid mucosal thickening. Orbits: No mass or acute finding. Review of the MIP images confirms the above findings CTA NECK FINDINGS Aortic arch: Common origin of the innominate and left common carotid arteries. Atherosclerotic plaque within the visualized aortic arch and proximal major branch vessels of the  neck. No hemodynamically significant innominate or proximal subclavian artery stenosis. Right carotid system: CCA and ICA patent within the neck without significant stenosis (50% or greater). Mild soft and calcified plaque within the carotid bifurcation and proximal ICA. Left carotid system: CCA and ICA patent within the neck without significant stenosis (50% or greater). Mild soft plaque within the proximal to mid CCA. Mild soft and calcified plaque within the carotid bifurcation and proximal ICA. Tortuosity and ectasia of the mid to distal cervical ICA, which measures up to 8 mm in diameter. Vertebral arteries: Patent within the neck bilaterally. The left vertebral artery is slightly dominant. Nonstenotic atherosclerotic plaque at the origin of both vertebral arteries. Skeleton: No acute bony abnormality or aggressive osseous lesion. Cervical spondylosis with multilevel disc space narrowing, disc bulges, uncovertebral hypertrophy and facet arthrosis. Disc space narrowing is moderate to moderately advanced at C6-C7. Fusion across the right C2-C3 facet joint. Grade 1 anterolisthesis at C3-C4, C4-C5, C5-C6 and C7-T1. Other neck: No neck mass or cervical lymphadenopathy. Upper chest: No consolidation within the imaged lung apices. Review of the MIP images confirms the above findings CTA HEAD FINDINGS Anterior circulation: The intracranial internal carotid arteries are patent. Nonstenotic calcified plaque within both vessels. The M1 middle cerebral arteries are patent. No M2 proximal branch occlusion or high-grade proximal stenosis is identified. Markedly hypoplastic or developmentally absent A1 right ACA. The anterior cerebral arteries are otherwise patent. No intracranial aneurysm is identified. Posterior circulation: The non dominant right vertebral artery is patent and appears to terminate predominantly as the right PICA. The dominant intracranial left vertebral artery is patent. The basilar artery is patent.  Predominantly fetal origin right posterior cerebral artery. A sizable left posterior communicating artery is also present. The posterior cerebral arteries are patent. Severe stenosis within the proximal P2 left PCA (series 17, image 21). Venous sinuses: Within the limitations of contrast timing, no convincing thrombus. Anatomic variants: As described Review of the MIP images confirms the above findings IMPRESSION: CT head: Patchy small-volume acute/early subacute infarcts within the left temporal occipital lobes as well as left thalamus (left PCA vascular territory), some of which were better appreciated on the MRI performed earlier  today. No evidence of hemorrhagic conversion. CTA neck: 1. The common and internal carotid arteries are patent within the neck without hemodynamically significant stenosis. Mild atherosclerotic plaque within both carotid systems as described. 2. Nonspecific tortuosity and ectasia of the mid to distal cervical left ICA, which measures up to 8 mm in diameter. 3. Vertebral arteries patent within the neck bilaterally. Nonstenotic calcified plaque at the origins of both vessels. 4.  Aortic Atherosclerosis (ICD10-I70.0). CTA head: 1. Severe stenosis within the proximal P2 segment of the left posterior cerebral artery. 2. Nonstenotic calcified plaque within the intracranial internal carotid arteries bilaterally. Electronically Signed   By: Kellie Simmering DO   On: 10/04/2020 20:21   MR BRAIN WO CONTRAST  Result Date: 10/04/2020 CLINICAL DATA:  Initial evaluation for acute TIA, right-sided numbness and tingling. Weakness. EXAM: MRI HEAD WITHOUT CONTRAST TECHNIQUE: Multiplanar, multiecho pulse sequences of the brain and surrounding structures were obtained without intravenous contrast. COMPARISON:  Previous MRI from 01/21/2013. FINDINGS: Brain: Cerebral volume within normal limits for age. No significant cerebral white matter disease. Patchy multifocal areas of small volume restricted diffusion  seen involving the parasagittal left temporal occipital region, consistent with an acute to early subacute left PCA distribution infarct (series 5, image 76). Subtle patchy involvement of the left thalamus noted. No associated hemorrhage or mass effect. No other diffusion abnormality to suggest acute or subacute ischemia elsewhere within the brain. Gray-white matter differentiation otherwise maintained. No encephalomalacia to suggest chronic cortical infarction elsewhere. No foci of susceptibility artifact to suggest acute or chronic intracranial hemorrhage. No mass lesion, midline shift or mass effect. No hydrocephalus or extra-axial fluid collection. Pituitary gland suprasellar region normal. Midline structures intact. Vascular: Major intracranial vascular flow voids are maintained. Increased FLAIR signal intensity without associated T1 correlate within the left transverse and sigmoid sinus is likely related to slow/sluggish flow. No frank imaging findings to suggest dural venous thrombosis. Skull and upper cervical spine: Craniocervical junction within normal limits. Bone marrow signal intensity normal. No scalp soft tissue abnormality. Sinuses/Orbits: Globes and orbital soft tissues within normal limits. Paranasal sinuses are clear. No mastoid effusion. Inner ear structures normal. Other: None. IMPRESSION: 1. Patchy small volume acute to early subacute left PCA distribution infarct as above. No associated hemorrhage or mass effect. 2. Otherwise normal brain MRI for age. Electronically Signed   By: Jeannine Boga M.D.   On: 10/04/2020 19:07   ECHOCARDIOGRAM COMPLETE  Result Date: 10/05/2020    ECHOCARDIOGRAM REPORT   Patient Name:   Gwendolyn Morrow Date of Exam: 10/05/2020 Medical Rec #:  [id number]   Height:       66.5 in Accession #:    [id number]  Weight:       135.0 lb Date of Birth:  [birth year]   BSA:          1.702 m Patient Age:    60 years    BP:           154/78 mmHg Patient Gender: F           HR:            87 bpm. Exam Location:  Inpatient Procedure: 2D Echo, Color Doppler and Cardiac Doppler Indications:    Stroke 434.91 / I163.9  History:        Patient has no prior history of Echocardiogram examinations.                 Risk Factors:Hypertension.  Sonographer:    Bernadene Person  RDCS Referring Phys: [id number] TIMOTHY S OPYD IMPRESSIONS  1. Left ventricular ejection fraction, by estimation, is 65 to 70%. The left ventricle has normal function. The left ventricle has no regional wall motion abnormalities. Left ventricular diastolic parameters are consistent with Grade I diastolic dysfunction (impaired relaxation).  2. Right ventricular systolic function is normal. The right ventricular size is normal. There is normal pulmonary artery systolic pressure.  3. The mitral valve is normal in structure. Trivial mitral valve regurgitation. No evidence of mitral stenosis.  4. The aortic valve is tricuspid. Aortic valve regurgitation is not visualized. No aortic stenosis is present.  5. The inferior vena cava is normal in size with greater than 50% respiratory variability, suggesting right atrial pressure of 3 mmHg. FINDINGS  Left Ventricle: Left ventricular ejection fraction, by estimation, is 65 to 70%. The left ventricle has normal function. The left ventricle has no regional wall motion abnormalities. The left ventricular internal cavity size was normal in size. There is  no left ventricular hypertrophy. Left ventricular diastolic parameters are consistent with Grade I diastolic dysfunction (impaired relaxation). Normal left ventricular filling pressure. Right Ventricle: The right ventricular size is normal. No increase in right ventricular wall thickness. Right ventricular systolic function is normal. There is normal pulmonary artery systolic pressure. The tricuspid regurgitant velocity is 1.33 m/s, and  with an assumed right atrial pressure of 3 mmHg, the estimated right ventricular systolic pressure is 10.1 mmHg.  Left Atrium: Left atrial size was normal in size. Right Atrium: Right atrial size was normal in size. Pericardium: There is no evidence of pericardial effusion. Mitral Valve: The mitral valve is normal in structure. Trivial mitral valve regurgitation. No evidence of mitral valve stenosis. Tricuspid Valve: The tricuspid valve is normal in structure. Tricuspid valve regurgitation is trivial. No evidence of tricuspid stenosis. Aortic Valve: The aortic valve is tricuspid. Aortic valve regurgitation is not visualized. No aortic stenosis is present. Pulmonic Valve: The pulmonic valve was normal in structure. Pulmonic valve regurgitation is not visualized. No evidence of pulmonic stenosis. Aorta: The aortic root is normal in size and structure. Venous: The inferior vena cava is normal in size with greater than 50% respiratory variability, suggesting right atrial pressure of 3 mmHg. IAS/Shunts: No atrial level shunt detected by color flow Doppler.  LEFT VENTRICLE PLAX 2D LVIDd:         4.50 cm  Diastology LVIDs:         3.30 cm  LV e' medial:    7.94 cm/s LV PW:         0.80 cm  LV E/e' medial:  7.3 LV IVS:        1.00 cm  LV e' lateral:   10.40 cm/s LVOT diam:     1.90 cm  LV E/e' lateral: 5.6 LV SV:         77 LV SV Index:   45 LVOT Area:     2.84 cm  RIGHT VENTRICLE RV S prime:     20.40 cm/s TAPSE (M-mode): 2.3 cm LEFT ATRIUM             Index       RIGHT ATRIUM           Index LA diam:        3.00 cm 1.76 cm/m  RA Area:     14.10 cm LA Vol (A2C):   40.4 ml 23.74 ml/m RA Volume:   32.00 ml  18.81 ml/m LA Vol (A4C):  40.6 ml 23.86 ml/m LA Biplane Vol: 44.6 ml 26.21 ml/m  AORTIC VALVE LVOT Vmax:   122.00 cm/s LVOT Vmean:  94.300 cm/s LVOT VTI:    0.272 m  AORTA Ao Root diam: 3.30 cm MITRAL VALVE                TRICUSPID VALVE MV Area (PHT): 2.29 cm     TR Peak grad:   7.1 mmHg MV Decel Time: 331 msec     TR Vmax:        133.00 cm/s MV E velocity: 58.00 cm/s MV A velocity: 108.00 cm/s  SHUNTS MV E/A ratio:  0.54          Systemic VTI:  0.27 m                             Systemic Diam: 1.90 cm Skeet Latch MD Electronically signed by Skeet Latch MD Signature Date/Time: 10/05/2020/9:38:46 AM    Final     Procedures Procedures (including critical care time)  Medications Ordered in ED Medications  clopidogrel (PLAVIX) tablet 75 mg (75 mg Oral Given 10/05/20 0921)  0.9 %  sodium chloride infusion ( Intravenous New Bag/Given 10/04/20 2115)   stroke: mapping our early stages of recovery book ( Does not apply Canceled Entry 10/05/20 0018)  acetaminophen (TYLENOL) tablet 650 mg (650 mg Oral Given 10/05/20 0920)    Or  acetaminophen (TYLENOL) 160 MG/5ML solution 650 mg ( Per Tube See Alternative 10/05/20 0920)    Or  acetaminophen (TYLENOL) suppository 650 mg ( Rectal See Alternative 10/05/20 0920)  senna-docusate (Senokot-S) tablet 1 tablet (has no administration in time range)  sodium chloride 0.9 % bolus 1,000 mL (0 mLs Intravenous Stopped 10/04/20 2126)  iohexol (OMNIPAQUE) 350 MG/ML injection 75 mL (75 mLs Intravenous Contrast Given 10/04/20 1938)    ED Course  I have reviewed the triage vital signs and the nursing notes.  Pertinent labs & imaging results that were available during my care of the patient were reviewed by me and considered in my medical decision making (see chart for details).  Clinical Course as of 10/05/20 1048  Tue Oct 04, 2020  [id number] Discussed with neurology Dr. Lorrin Goodell.  He is recommending that patient get an MRI brain.  If her MRI is negative she should have an aspirin and likely will need admission for TIA work-up. [MB]  1933 Dr. Lorrin Goodell reviewed the patient's MRI and gave me a call back.  He is a little more worried that this may be a PCA thrombus.  He is asking for her to get an emergent CT angio head and neck.  Recommend she should get a bed on [facility] case intervention is needed if she worsens. [MB]  1945 Discussed with Dr. Myna Hidalgo from [facility] hospitalist who will  evaluate the patient for admission to [place]. [MB]    Clinical Course User Index [MB] Hayden Rasmussen, MD   MDM Rules/Calculators/A&P                         This patient complains of generalized weakness fatigue headache, 2 episodes of numbness right side; this involves an extensive number of treatment Options and is a complaint that carries with it a high risk of complications and Morbidity. The differential includes stroke, TIA, metabolic derangement, arrhythmia, dehydration, COVID  I ordered, reviewed and interpreted labs, which included CBC with normal white count  normal hemoglobin, chemistries and LFTs normal, magnesium normal, coags normal I ordered medication aspirin and Plavix I ordered imaging studies which included MRI brain CT angio head and neck and I independently    visualized and interpreted imaging which showed acute PCA stroke  Previous records obtained and reviewed in epic, no recent findings I consulted neurology Dr.Khaliqdina and [facility] hospitalist Dr. Myna Hidalgo and discussed lab and imaging findings  Critical Interventions: None  After the interventions stated above, I reevaluated the patient and found patient to be currently asymptomatic.  Reviewed neurology recommendations for admission and patient in agreement with plan.   Final Clinical Impression(s) / ED Diagnoses Final diagnoses:  Acute CVA (cerebrovascular accident) [facility])    Rx / [facility] Orders ED Discharge Orders    None       Hayden Rasmussen, MD 10/05/20 1052

## 2020-10-04 NOTE — ED Notes (Signed)
Dr. Myna Hidalgo aware pt passed swallow screen.

## 2020-10-04 NOTE — ED Triage Notes (Signed)
Arrives via EMS from home, C/C intermittent numbness and tingling in L arm, leg and mouth x3 days. Hx of HTN and BP was 180s w/ EMS. Complains of weakness and headache x3 days as well. Fully vaccinated against COVID-19.

## 2020-10-04 NOTE — Progress Notes (Signed)
Brief Neuro Note:  Briefly, [name] is a 80 y.o. female p/w 3 days hx of intermittent R sided numbness with R visiovision deficit today. Workup with MRI Brain with patchy L PCA territory infarcts. CT angio with a left P2 stenosis. Discussed case with ED team and inpatient admission team.  Recs: - Recommend fluids, permissive hypertension, head of bed flat and strict bedrest for 24-48 hours. - Prefer admission to [facility] over [facility]. - Patient is allergic to aspirin, started on plavix 75mg  daily. - Stroke workup ordered with TTE, LDL, HbA1c ordered. - Overnight neurology team at [facility] is aware.   [place] Pager Number [phone]

## 2020-10-04 NOTE — ED Notes (Signed)
Per Dr. Lorrin Goodell patient needs to have head of bed flat, strict bedrest, and fluids running. Patient is aware. Patients head of bed is flat, patient aware she is on bedrest, IV NS running as ordered.

## 2020-10-04 NOTE — ED Notes (Signed)
Dr. Myna Hidalgo bedside.

## 2020-10-04 NOTE — ED Notes (Signed)
Pure wick in place, pt is on bedrest per Dr. Lorrin Goodell.

## 2020-10-04 NOTE — ED Notes (Signed)
Went to complete EKG and patient was gone for testing.

## 2020-10-05 ENCOUNTER — Observation Stay (HOSPITAL_BASED_OUTPATIENT_CLINIC_OR_DEPARTMENT_OTHER): Payer: Medicare Other

## 2020-10-05 DIAGNOSIS — Z91048 Other nonmedicinal substance allergy status: Secondary | ICD-10-CM | POA: Diagnosis not present

## 2020-10-05 DIAGNOSIS — I639 Cerebral infarction, unspecified: Secondary | ICD-10-CM | POA: Diagnosis not present

## 2020-10-05 DIAGNOSIS — J302 Other seasonal allergic rhinitis: Secondary | ICD-10-CM | POA: Diagnosis present

## 2020-10-05 DIAGNOSIS — Z96651 Presence of right artificial knee joint: Secondary | ICD-10-CM | POA: Diagnosis present

## 2020-10-05 DIAGNOSIS — I6389 Other cerebral infarction: Secondary | ICD-10-CM | POA: Diagnosis not present

## 2020-10-05 DIAGNOSIS — I6359 Cerebral infarction due to unspecified occlusion or stenosis of other cerebral artery: Secondary | ICD-10-CM | POA: Diagnosis not present

## 2020-10-05 DIAGNOSIS — Z880 Allergy status to penicillin: Secondary | ICD-10-CM | POA: Diagnosis not present

## 2020-10-05 DIAGNOSIS — Z90710 Acquired absence of both cervix and uterus: Secondary | ICD-10-CM | POA: Diagnosis not present

## 2020-10-05 DIAGNOSIS — Z888 Allergy status to other drugs, medicaments and biological substances status: Secondary | ICD-10-CM | POA: Diagnosis not present

## 2020-10-05 DIAGNOSIS — Z9049 Acquired absence of other specified parts of digestive tract: Secondary | ICD-10-CM | POA: Diagnosis not present

## 2020-10-05 DIAGNOSIS — Z886 Allergy status to analgesic agent status: Secondary | ICD-10-CM | POA: Diagnosis not present

## 2020-10-05 DIAGNOSIS — I10 Essential (primary) hypertension: Secondary | ICD-10-CM | POA: Diagnosis present

## 2020-10-05 DIAGNOSIS — R29700 NIHSS score 0: Secondary | ICD-10-CM | POA: Diagnosis present

## 2020-10-05 DIAGNOSIS — I69351 Hemiplegia and hemiparesis following cerebral infarction affecting right dominant side: Secondary | ICD-10-CM | POA: Diagnosis not present

## 2020-10-05 DIAGNOSIS — I63539 Cerebral infarction due to unspecified occlusion or stenosis of unspecified posterior cerebral artery: Secondary | ICD-10-CM | POA: Diagnosis present

## 2020-10-05 DIAGNOSIS — E785 Hyperlipidemia, unspecified: Secondary | ICD-10-CM | POA: Diagnosis present

## 2020-10-05 DIAGNOSIS — Z20822 Contact with and (suspected) exposure to covid-19: Secondary | ICD-10-CM | POA: Diagnosis present

## 2020-10-05 DIAGNOSIS — K219 Gastro-esophageal reflux disease without esophagitis: Secondary | ICD-10-CM | POA: Diagnosis present

## 2020-10-05 DIAGNOSIS — Z79899 Other long term (current) drug therapy: Secondary | ICD-10-CM | POA: Diagnosis not present

## 2020-10-05 DIAGNOSIS — Z9851 Tubal ligation status: Secondary | ICD-10-CM | POA: Diagnosis not present

## 2020-10-05 LAB — URINALYSIS, ROUTINE W REFLEX MICROSCOPIC
Bacteria, UA: NONE SEEN
Bilirubin Urine: NEGATIVE
Glucose, UA: NEGATIVE mg/dL
Hgb urine dipstick: NEGATIVE
Ketones, ur: 20 mg/dL — AB
Nitrite: NEGATIVE
Protein, ur: NEGATIVE mg/dL
Specific Gravity, Urine: 1.019 (ref 1.005–1.030)
pH: 7 (ref 5.0–8.0)

## 2020-10-05 LAB — ECHOCARDIOGRAM COMPLETE
Area-P 1/2: 2.29 cm2
Height: 66.5 in
S' Lateral: 3.3 cm
Weight: 2160 oz

## 2020-10-05 LAB — SARS CORONAVIRUS 2 (TAT 6-24 HRS): SARS Coronavirus 2: NEGATIVE

## 2020-10-05 MED ORDER — PANTOPRAZOLE SODIUM 40 MG PO TBEC
40.0000 mg | DELAYED_RELEASE_TABLET | Freq: Every day | ORAL | Status: DC
  Administered 2020-10-05 – 2020-10-07 (×3): 40 mg via ORAL
  Filled 2020-10-05 (×3): qty 1

## 2020-10-05 MED ORDER — ATORVASTATIN CALCIUM 40 MG PO TABS
40.0000 mg | ORAL_TABLET | Freq: Every day | ORAL | Status: DC
  Administered 2020-10-05 – 2020-10-06 (×2): 40 mg via ORAL
  Filled 2020-10-05 (×2): qty 1

## 2020-10-05 MED ORDER — ASPIRIN EC 81 MG PO TBEC
81.0000 mg | DELAYED_RELEASE_TABLET | Freq: Every day | ORAL | Status: DC
  Administered 2020-10-05 – 2020-10-07 (×3): 81 mg via ORAL
  Filled 2020-10-05 (×3): qty 1

## 2020-10-05 NOTE — Progress Notes (Signed)
  PT Cancellation Note  Patient Details Name: Gwendolyn Morrow MRN: [id number] DOB: May 26, 1941   Cancelled Treatment:    Reason Eval/Treat Not Completed: Active bedrest order x 24-48 from 1900 10/04/20.   Claretha Cooper 10/05/2020, 7:56 AM  [place] Pager [phone] Office [phone]

## 2020-10-05 NOTE — Consult Note (Signed)
Neurology Consult  CC: Stroke  History is obtained from: patient  HPI: Gwendolyn Morrow is a 80 y.o. female with a PMHx of HTN and HLD who presented to the [facility] ED on 10/04/20 with c/o right sided numbness from shoulder to foot with slight right arm weakness and difficulty concentrating. Patient reports episodic numbness x 3 over past few days prior to admission which only lasted about a minute each. However, on 10/04/20, she felt continuous numbness with difficulty word finding like she was in a fog. At first, she thought she might have COVID, but later decided to come to ED. She also reports that she had a HA frontal and posteriorly x 3 days which is described as pressure. She also felt like her right eye peripheral vision was off, but denies bumping into objects and this has now resolved. She reports that she was a bit dizzy over those 3 days. She denied clumsiness, dropping objects, or falling.   Neurology tele was called who reviewed images and made recommendations which included transfer to [facility]. Patient was started on Plavix. ASA was not started due to allergy to ASA on chart. However, patient states she has taken ASA in the past without issue and has no problem with restarting it. Her stroke workup has been underway with normal A1c and LDL 29f 90. Patient's home Atorvastatin was increased from 20mg  po qd to 40mg  po qd. Echo with EF of 65-70% with Grade I diastolic dysfunction.   Today, patient states her symptoms have resolved completely with the exception of still feeling like she can not think of a word she wants to say.   NIHSS:  1a Level of Conscious: 0 1b LOC Questions: 0 1c LOC Commands: 0 2 Best Gaze: 0 3 Visual: 0 4 Facial Palsy: 0 5a Motor Arm - left: 0 5b Motor Arm - Right: 0 6a Motor Leg - Left: 0 6b Motor Leg - Right: 0 7 Limb Ataxia: 0 8 Sensory: 0 9 Best Language: 0 10 Dysarthria: 0 11 Extinct. and Inatten: 0 TOTAL: 0   ROS: A complete ROS was performed and is negative except  as noted in the HPI.   Past Medical History:  Diagnosis Date  . Arthritis   . Complication of anesthesia   . GERD (gastroesophageal reflux disease)   . H/O seasonal allergies   . Headache   . Hypertension   . PONV (postoperative nausea and vomiting)     History reviewed. No pertinent family history.  Social History:  reports that she has never smoked. She has never used smokeless tobacco. She reports current alcohol use. She reports that she does not use drugs.   Prior to Admission medications   Medication Sig Start Date End Date Taking? Authorizing Provider  amLODipine (NORVASC) 10 MG tablet Take 10 mg by mouth daily.   Yes [provider]  Ascorbic Acid (VITAMIN C) 1000 MG tablet Take 1,000 mg by mouth every evening.   Yes [provider]  atorvastatin (LIPITOR) 20 MG tablet Take 20 mg by mouth daily at 6 PM.   Yes [provider]  BIOTIN PO Take 15 mg by mouth daily.   Yes [provider]  calcium carbonate (OSCAL) 1500 (600 Ca) MG TABS tablet Take 600 mg of elemental calcium by mouth daily with breakfast.   Yes [provider]  Multiple Vitamin (MULTIVITAMIN WITH MINERALS) TABS tablet Take 1 tablet by mouth daily.   Yes [provider]  OMEGA-3 KRILL OIL PO  Take 350 mg by mouth every evening.   Yes [provider]  omeprazole (PRILOSEC) 20 MG capsule Take 20 mg by mouth daily.   Yes [provider]  potassium chloride SA (KLOR-CON) 20 MEQ tablet Take 20 mEq by mouth daily. 08/15/20  Yes [provider]  triamterene-hydrochlorothiazide (MAXZIDE-25) 37.5-25 MG tablet Take 1 tablet by mouth daily.   Yes [provider]  Turmeric 500 MG CAPS Take 500 mg by mouth every evening.   Yes [provider]  vitamin B-12 (CYANOCOBALAMIN) 500 MCG tablet Take 500 mcg by mouth 2 (two) times a week. Take on Mon & Fri   Yes [provider]  acyclovir (ZOVIRAX) 400 MG tablet Take 400 mg by  mouth 3 (three) times daily as needed (fever blisters).  07/12/16   [provider]  docusate sodium (COLACE) 100 MG capsule Take 1 capsule (100 mg total) by mouth 2 (two) times daily. Patient not taking: No sig reported 09/26/16   Danae Orleans, PA-C  ferrous sulfate 325 (65 FE) MG tablet Take 1 tablet (325 mg total) by mouth 3 (three) times daily after meals. Patient not taking: No sig reported 09/26/16   Danae Orleans, PA-C  fexofenadine (ALLEGRA) 180 MG tablet Take 180 mg by mouth daily as needed for allergies.    [provider]  HYDROcodone-acetaminophen (NORCO) 7.5-325 MG tablet Take 1-2 tablets by mouth every 4 (four) hours as needed for moderate pain. Patient not taking: No sig reported 09/26/16   Danae Orleans, PA-C  methocarbamol (ROBAXIN) 500 MG tablet Take 1 tablet (500 mg total) by mouth every 6 (six) hours as needed for muscle spasms. Patient not taking: No sig reported 09/26/16   Danae Orleans, PA-C  polyethylene glycol (MIRALAX / GLYCOLAX) packet Take 17 g by mouth 2 (two) times daily. Patient not taking: No sig reported 09/26/16   Danae Orleans, PA-C    Exam: Current vital signs: BP (!) 164/80 (BP Location: Right Arm)   Pulse 97   Temp 98.6 F (37 C) (Oral)   Resp 16   Ht 5' 6.5" (1.689 m)   Wt 61.2 kg   SpO2 97%   BMI 21.46 kg/m   Physical Exam  Constitutional: Appears well-developed and well-nourished.  Psych: Affect appropriate to situation Eyes: No scleral injection HENT: No OP obstrucion Head: Normocephalic.  Cardiovascular: RRR Respiratory: Effort normal.  GI: Soft.  Skin: WDI  Neuro: Mental Status: Patient is awake, alert, oriented to person, place, month, year, and situation. Patient is able to give a clear and coherent history. No signs of aphasia or neglect. Speech/Language: Speech is intact and fluent. No dysarthria Comprehension, repetition, and naming intact.  Cranial Nerves: II: Visual Fields are full. Pupils are  equal, round, and reactive to light. III,IV, VI: EOMI without ptosis or diploplia.  V: Facial sensation is symmetric to light touch. Able to move jaw back and forth.  VII: Smile is symmetrical. Able to puff cheeks and raise eyebrows.  VIII: hearing is intact to voice IX, X: Uvula elevates symmetrically. Phonation normal.  XI: Shoulder shrug is symmetric. XII: tongue is midline without atrophy or fasciculations.  Motor: Tone is normal. Bulk is normal. 5/5 strength was present in all four extremities. Sensory: Sensation is symmetric to light touch in the arms and legs. Extinction intact.  Deep Tendon Reflexes: 2+ and symmetric in the biceps and patellae. Plantars: Toes are downgoing bilaterally. Cerebellar: FNF and HKS are intact bilaterally. No drift UE/LE.  Gait: deferred.  I have reviewed labs in epic and the pertinent results are: INR  0.9    LDL 90     HbA1c 5.3%  Images previous reviewed by neurologist and radiologist  CTA neck: 1. The common and internal carotid arteries are patent within the neck without hemodynamically significant stenosis. Mild atherosclerotic plaque within both carotid systems as described. 2. Nonspecific tortuosity and ectasia of the mid to distal cervical left ICA, which measures up to 8 mm in diameter. 3. Vertebral arteries patent within the neck bilaterally. Nonstenotic calcified plaque at the origins of both vessels. 4.  Aortic Atherosclerosis (ICD10-I70.0).  CTA head: 1. Severe stenosis within the proximal P2 segment of the left posterior cerebral artery. 2. Nonstenotic calcified plaque within the intracranial internal carotid arteries bilaterally.  MRI brain  1. Patchy small volume acute to early subacute left PCA distribution infarct as above. No associated hemorrhage or mass effect. 2. Otherwise normal brain MRI for age.  Assessment: JAKAYLAH SCHLAFER is a 80 y.o. female PMHx HTN and HLD who presented to [facility] ED with right sided numbness and  right arm weakness with difficulty concentrating. MRI + for acute to early subacute left PCA distribution infarct. CTA head and neck significant for proximal P2 stenosis LPCA. MRI and stenotic lesion are concordant and she will benefit from antiplatelet therapy.  Impression:  1. 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   Recommendations:   - Recommend labs:TSH. -continue Lipitor at higher dose and have patient f/up cholesterol panel.  - start Aspirin 81mg  daily as patient is not allergic. -ordered.  - continue Clopidogrel 75mg  daily. - SBP goal - Permissive hypertension first 24 h < 220/110. Hold home medications for now. - Telemetry monitoring for arrhythmia. - Recommend bedside Swallow screen. - Recommend Stroke education. - Recommend PT/OT/SLP consult. - Recommend metabolic/infectious workup with CBC, CMP, UA with UCx, CXR, CK, serum lactate. -out patient neurology f/up in 4 weeks.   Lynnae Sandhoff, MD Page: [phone]

## 2020-10-05 NOTE — ED Notes (Signed)
Pt linens changed. Pt belongings placed in bags and secured. Pt purwic repositioned and mesh underwear applied. Warm blankets provided. New IV placed.

## 2020-10-05 NOTE — Progress Notes (Signed)
PROGRESS NOTE    Gwendolyn Morrow  [id number] DOB: 10/28/1940 DOA: 10/04/2020 PCP: Lujean Amel, MD   Brief Narrative:  HPI: Gwendolyn Morrow is a 80 y.o. female with medical history significant for hypertension, now presenting to the emergency department for evaluation of right-sided numbness, weakness, and some difficulty concentrating.  The patient reports that the symptoms initially developed roughly 3 days ago but lasted only a few minutes at that time.  Today, she developed numbness involving the right upper and lower extremity, right face, and also feels that she has weakness on that side.  Patient also reports some difficulty concentrating, and notes that it was difficult for her to remember which medications she is taking when questioned by the pharmacy tech.  She denies any recent fevers, chills, chest pain, palpitations, abdominal pain, nausea, vomiting, or diarrhea.  ED Course: Upon arrival to the ED, patient is found to be afebrile, saturating well on room air, tachycardic in the 110s initially, and with modest elevation of blood pressure.  EKG features a sinus rhythm.  Chemistry panel is unremarkable and CBC notable for slight polycythemia.  MRI brain reveals patchy small volume acute to early subacute PCA distribution infarction.  CTA head and neck notable for severe stenosis involving the proximal P2 segment of left PCA.  Neurology was consulted by the ED physician and hospitalist asked to admit.  Patient was given Plavix and IV fluids in the ED.  COVID-19 screening test is pending.  Assessment & Plan:   Principal Problem:   Acute ischemic stroke [facility]) Active Problems:   Hypertension   Acute ischemic CVA: Presented with right-sided numbness and questionable weakness.  MRI brain reveals patchy small volume acute to early subacute left PCA distribution infarction without mass-effect or hemorrhage.  CTA head and neck reveals high-grade stenosis involving left PCA, proximal P2 segment.   Neurology on board.  Patient is allergic to aspirin so she is on Plavix.  She is waiting for bed at [facility].  Allowing permissive hypertension.  Further management per neurology.  Currently patient's symptoms of numbness have resolved completely.  Her power is 5/5 in all 4 extremities.  Echo done but results pending.  Hemoglobin A1c normal.  COVID-19 negative.  Essential hypertension: Blood pressure fairly controlled, continue to hold antihypertensives.  Hyperlipidemia: LDL 90.  She was taking atorvastatin 20 mg.  I will increase that to 40 mg.  GERD: Resume PPI.  DVT prophylaxis:    Code Status: Full Code  Family Communication:  None present at bedside.  Plan of care discussed with patient in length and he verbalized understanding and agreed with it.  Status is: Observation  The patient will require care spanning > 2 midnights and should be moved to inpatient because: Inpatient level of care appropriate due to severity of illness  Dispo: The patient is from: Home              Anticipated d/c is to: Home              Anticipated d/c date is: 1 day              Patient currently is not medically stable to d/c.        Estimated body mass index is 21.46 kg/m as calculated from the following:   Height as of this encounter: 5' 6.5" (1.689 m).   Weight as of this encounter: 61.2 kg.      Nutritional status:  Consultants:   Neurology  Procedures:   None  Antimicrobials:  Anti-infectives (From admission, onward)   None         Subjective: Patient seen and examined.  She feels much better.  No more numbness or any weakness.  No other complaint.  Objective: Vitals:   10/05/20 1000 10/05/20 1200 10/05/20 1230 10/05/20 1300  BP: (!) 150/89 (!) 160/80 (!) 152/86 (!) 151/84  Pulse: 80 82 95 83  Resp: 17 18 12 16   Temp:      TempSrc:      SpO2: 97% 96% 94% 97%  Weight:      Height:        Intake/Output Summary (Last 24 hours) at  10/05/2020 1318 Last data filed at 10/04/2020 2126 Gross per 24 hour  Intake 1000 ml  Output --  Net 1000 ml   Filed Weights   10/04/20 1128  Weight: 61.2 kg    Examination:  General exam: Appears calm and comfortable  Respiratory system: Clear to auscultation. Respiratory effort normal. Cardiovascular system: S1 & S2 heard, RRR. No JVD, murmurs, rubs, gallops or clicks. No pedal edema. Gastrointestinal system: Abdomen is nondistended, soft and nontender. No organomegaly or masses felt. Normal bowel sounds heard. Central nervous system: Alert and oriented. No focal neurological deficits. Extremities: Symmetric 5 x 5 power. Skin: No rashes, lesions or ulcers Psychiatry: Judgement and insight appear normal. Mood & affect appropriate.    Data Reviewed: I have personally reviewed following labs and imaging studies  CBC: Recent Labs  Lab 10/04/20 1321 10/04/20 1353  WBC 8.5  --   NEUTROABS 6.4  --   HGB 15.7* 15.6*  HCT 46.4* 46.0  MCV 89.4  --   PLT 260  --    Basic Metabolic Panel: Recent Labs  Lab 10/04/20 1321 10/04/20 1353 10/04/20 1800  NA 141 139  --   K 3.6 3.6  --   CL 103 103  --   CO2 25  --   --   GLUCOSE 103* 100*  --   BUN 16 17  --   CREATININE 0.83 0.80  --   CALCIUM 10.0  --   --   MG  --   --  2.0   GFR: Estimated Creatinine Clearance: 56.2 mL/min (by C-G formula based on SCr of 0.8 mg/dL). Liver Function Tests: Recent Labs  Lab 10/04/20 1321  AST 23  ALT 20  ALKPHOS 95  BILITOT 0.8  PROT 8.0  ALBUMIN 4.9   No results for input(s): LIPASE, AMYLASE in the last 168 hours. No results for input(s): AMMONIA in the last 168 hours. Coagulation Profile: Recent Labs  Lab 10/04/20 1321  INR 0.9   Cardiac Enzymes: No results for input(s): CKTOTAL, CKMB, CKMBINDEX, TROPONINI in the last 168 hours. BNP (last 3 results) No results for input(s): PROBNP in the last 8760 hours. HbA1C: Recent Labs    10/04/20 1932  HGBA1C 5.3    CBG: Recent Labs  Lab 10/04/20 1126  GLUCAP 117*   Lipid Profile: Recent Labs    10/04/20 1932  CHOL 169  HDL 59  LDLCALC 90  TRIG 101  CHOLHDL 2.9   Thyroid Function Tests: No results for input(s): TSH, T4TOTAL, FREET4, T3FREE, THYROIDAB in the last 72 hours. Anemia Panel: No results for input(s): VITAMINB12, FOLATE, FERRITIN, TIBC, IRON, RETICCTPCT in the last 72 hours. Sepsis Labs: No results for input(s): PROCALCITON, LATICACIDVEN in the last 168 hours.  Recent Results (from the past 240 hour(s))  SARS CORONAVIRUS 2 (TAT 6-24 HRS) Nasopharyngeal Nasopharyngeal Swab     Status: None   Collection Time: 10/04/20  7:17 PM   Specimen: Nasopharyngeal Swab  Result Value Ref Range Status   SARS Coronavirus 2 NEGATIVE NEGATIVE Final    Comment: (NOTE) SARS-CoV-2 target nucleic acids are NOT DETECTED.  The SARS-CoV-2 RNA is generally detectable in upper and lower respiratory specimens during the acute phase of infection. Negative results do not preclude SARS-CoV-2 infection, do not rule out co-infections with other pathogens, and should not be used as the sole basis for treatment or other patient management decisions. Negative results must be combined with clinical observations, patient history, and epidemiological information. The expected result is Negative.  Fact Sheet for Patients: SugarRoll.be  Fact Sheet for Healthcare Providers: https://www.woods-mathews.com/  This test is not yet approved or cleared by the [place] FDA and  has been authorized for detection and/or diagnosis of SARS-CoV-2 by FDA under an Emergency Use Authorization (EUA). This EUA will remain  in effect (meaning this test can be used) for the duration of the COVID-19 declaration under Se ction 564(b)(1) of the Act, 21 U.S.C. section 360bbb-3(b)(1), unless the authorization is terminated or revoked sooner.  Performed at [facility],  [place] [place] [address] [place], [place] [address]       Radiology Studies: CT ANGIO HEAD W OR WO CONTRAST  Result Date: 10/04/2020 CLINICAL DATA:  Neuro deficit, acute, stroke suspected. Additional history provided: Patient reports intermittent numbness and tingling in left arm, leg and mouth for 3 days. EXAM: CT ANGIOGRAPHY HEAD AND NECK TECHNIQUE: Multidetector CT imaging of the head and neck was performed using the standard protocol during bolus administration of intravenous contrast. Multiplanar CT image reconstructions and MIPs were obtained to evaluate the vascular anatomy. Carotid stenosis measurements (when applicable) are obtained utilizing NASCET criteria, using the distal internal carotid diameter as the denominator. CONTRAST:  43mL OMNIPAQUE IOHEXOL 350 MG/ML SOLN COMPARISON:  Brain MRI performed earlier the same day 10/04/2020. FINDINGS: CT HEAD FINDINGS Brain: Patchy small volume cortical and subcortical acute/early subacute infarcts within the left PCA territory involving the left temporal and occipital lobes, better appreciated on the brain MRI performed earlier today. Redemonstrated associated acute/early subacute infarct within the left thalamus. No evidence of hemorrhagic conversion. No extra-axial fluid collection. No evidence of intracranial mass. No midline shift. Vascular: No hyperdense vessel.  Atherosclerotic calcifications. Skull: Normal. Negative for fracture or focal lesion. Sinuses: Mild bilateral ethmoid mucosal thickening. Orbits: No mass or acute finding. Review of the MIP images confirms the above findings CTA NECK FINDINGS Aortic arch: Common origin of the innominate and left common carotid arteries. Atherosclerotic plaque within the visualized aortic arch and proximal major branch vessels of the neck. No hemodynamically significant innominate or proximal subclavian artery stenosis. Right carotid system: CCA and ICA patent within the neck without significant stenosis (50% or greater).  Mild soft and calcified plaque within the carotid bifurcation and proximal ICA. Left carotid system: CCA and ICA patent within the neck without significant stenosis (50% or greater). Mild soft plaque within the proximal to mid CCA. Mild soft and calcified plaque within the carotid bifurcation and proximal ICA. Tortuosity and ectasia of the mid to distal cervical ICA, which measures up to 8 mm in diameter. Vertebral arteries: Patent within the neck bilaterally. The left vertebral artery is slightly dominant. Nonstenotic atherosclerotic plaque at the origin of both vertebral arteries. Skeleton: No acute bony abnormality or aggressive osseous lesion. Cervical spondylosis with  multilevel disc space narrowing, disc bulges, uncovertebral hypertrophy and facet arthrosis. Disc space narrowing is moderate to moderately advanced at C6-C7. Fusion across the right C2-C3 facet joint. Grade 1 anterolisthesis at C3-C4, C4-C5, C5-C6 and C7-T1. Other neck: No neck mass or cervical lymphadenopathy. Upper chest: No consolidation within the imaged lung apices. Review of the MIP images confirms the above findings CTA HEAD FINDINGS Anterior circulation: The intracranial internal carotid arteries are patent. Nonstenotic calcified plaque within both vessels. The M1 middle cerebral arteries are patent. No M2 proximal branch occlusion or high-grade proximal stenosis is identified. Markedly hypoplastic or developmentally absent A1 right ACA. The anterior cerebral arteries are otherwise patent. No intracranial aneurysm is identified. Posterior circulation: The non dominant right vertebral artery is patent and appears to terminate predominantly as the right PICA. The dominant intracranial left vertebral artery is patent. The basilar artery is patent. Predominantly fetal origin right posterior cerebral artery. A sizable left posterior communicating artery is also present. The posterior cerebral arteries are patent. Severe stenosis within the  proximal P2 left PCA (series 17, image 21). Venous sinuses: Within the limitations of contrast timing, no convincing thrombus. Anatomic variants: As described Review of the MIP images confirms the above findings IMPRESSION: CT head: Patchy small-volume acute/early subacute infarcts within the left temporal occipital lobes as well as left thalamus (left PCA vascular territory), some of which were better appreciated on the MRI performed earlier today. No evidence of hemorrhagic conversion. CTA neck: 1. The common and internal carotid arteries are patent within the neck without hemodynamically significant stenosis. Mild atherosclerotic plaque within both carotid systems as described. 2. Nonspecific tortuosity and ectasia of the mid to distal cervical left ICA, which measures up to 8 mm in diameter. 3. Vertebral arteries patent within the neck bilaterally. Nonstenotic calcified plaque at the origins of both vessels. 4.  Aortic Atherosclerosis (ICD10-I70.0). CTA head: 1. Severe stenosis within the proximal P2 segment of the left posterior cerebral artery. 2. Nonstenotic calcified plaque within the intracranial internal carotid arteries bilaterally. Electronically Signed   By: Kellie Simmering DO   On: 10/04/2020 20:21   CT ANGIO NECK W OR WO CONTRAST  Result Date: 10/04/2020 CLINICAL DATA:  Neuro deficit, acute, stroke suspected. Additional history provided: Patient reports intermittent numbness and tingling in left arm, leg and mouth for 3 days. EXAM: CT ANGIOGRAPHY HEAD AND NECK TECHNIQUE: Multidetector CT imaging of the head and neck was performed using the standard protocol during bolus administration of intravenous contrast. Multiplanar CT image reconstructions and MIPs were obtained to evaluate the vascular anatomy. Carotid stenosis measurements (when applicable) are obtained utilizing NASCET criteria, using the distal internal carotid diameter as the denominator. CONTRAST:  2mL OMNIPAQUE IOHEXOL 350 MG/ML SOLN  COMPARISON:  Brain MRI performed earlier the same day 10/04/2020. FINDINGS: CT HEAD FINDINGS Brain: Patchy small volume cortical and subcortical acute/early subacute infarcts within the left PCA territory involving the left temporal and occipital lobes, better appreciated on the brain MRI performed earlier today. Redemonstrated associated acute/early subacute infarct within the left thalamus. No evidence of hemorrhagic conversion. No extra-axial fluid collection. No evidence of intracranial mass. No midline shift. Vascular: No hyperdense vessel.  Atherosclerotic calcifications. Skull: Normal. Negative for fracture or focal lesion. Sinuses: Mild bilateral ethmoid mucosal thickening. Orbits: No mass or acute finding. Review of the MIP images confirms the above findings CTA NECK FINDINGS Aortic arch: Common origin of the innominate and left common carotid arteries. Atherosclerotic plaque within the visualized aortic arch and proximal major  branch vessels of the neck. No hemodynamically significant innominate or proximal subclavian artery stenosis. Right carotid system: CCA and ICA patent within the neck without significant stenosis (50% or greater). Mild soft and calcified plaque within the carotid bifurcation and proximal ICA. Left carotid system: CCA and ICA patent within the neck without significant stenosis (50% or greater). Mild soft plaque within the proximal to mid CCA. Mild soft and calcified plaque within the carotid bifurcation and proximal ICA. Tortuosity and ectasia of the mid to distal cervical ICA, which measures up to 8 mm in diameter. Vertebral arteries: Patent within the neck bilaterally. The left vertebral artery is slightly dominant. Nonstenotic atherosclerotic plaque at the origin of both vertebral arteries. Skeleton: No acute bony abnormality or aggressive osseous lesion. Cervical spondylosis with multilevel disc space narrowing, disc bulges, uncovertebral hypertrophy and facet arthrosis. Disc  space narrowing is moderate to moderately advanced at C6-C7. Fusion across the right C2-C3 facet joint. Grade 1 anterolisthesis at C3-C4, C4-C5, C5-C6 and C7-T1. Other neck: No neck mass or cervical lymphadenopathy. Upper chest: No consolidation within the imaged lung apices. Review of the MIP images confirms the above findings CTA HEAD FINDINGS Anterior circulation: The intracranial internal carotid arteries are patent. Nonstenotic calcified plaque within both vessels. The M1 middle cerebral arteries are patent. No M2 proximal branch occlusion or high-grade proximal stenosis is identified. Markedly hypoplastic or developmentally absent A1 right ACA. The anterior cerebral arteries are otherwise patent. No intracranial aneurysm is identified. Posterior circulation: The non dominant right vertebral artery is patent and appears to terminate predominantly as the right PICA. The dominant intracranial left vertebral artery is patent. The basilar artery is patent. Predominantly fetal origin right posterior cerebral artery. A sizable left posterior communicating artery is also present. The posterior cerebral arteries are patent. Severe stenosis within the proximal P2 left PCA (series 17, image 21). Venous sinuses: Within the limitations of contrast timing, no convincing thrombus. Anatomic variants: As described Review of the MIP images confirms the above findings IMPRESSION: CT head: Patchy small-volume acute/early subacute infarcts within the left temporal occipital lobes as well as left thalamus (left PCA vascular territory), some of which were better appreciated on the MRI performed earlier today. No evidence of hemorrhagic conversion. CTA neck: 1. The common and internal carotid arteries are patent within the neck without hemodynamically significant stenosis. Mild atherosclerotic plaque within both carotid systems as described. 2. Nonspecific tortuosity and ectasia of the mid to distal cervical left ICA, which measures  up to 8 mm in diameter. 3. Vertebral arteries patent within the neck bilaterally. Nonstenotic calcified plaque at the origins of both vessels. 4.  Aortic Atherosclerosis (ICD10-I70.0). CTA head: 1. Severe stenosis within the proximal P2 segment of the left posterior cerebral artery. 2. Nonstenotic calcified plaque within the intracranial internal carotid arteries bilaterally. Electronically Signed   By: Kellie Simmering DO   On: 10/04/2020 20:21   MR BRAIN WO CONTRAST  Result Date: 10/04/2020 CLINICAL DATA:  Initial evaluation for acute TIA, right-sided numbness and tingling. Weakness. EXAM: MRI HEAD WITHOUT CONTRAST TECHNIQUE: Multiplanar, multiecho pulse sequences of the brain and surrounding structures were obtained without intravenous contrast. COMPARISON:  Previous MRI from 01/21/2013. FINDINGS: Brain: Cerebral volume within normal limits for age. No significant cerebral white matter disease. Patchy multifocal areas of small volume restricted diffusion seen involving the parasagittal left temporal occipital region, consistent with an acute to early subacute left PCA distribution infarct (series 5, image 76). Subtle patchy involvement of the left thalamus noted. No  associated hemorrhage or mass effect. No other diffusion abnormality to suggest acute or subacute ischemia elsewhere within the brain. Gray-white matter differentiation otherwise maintained. No encephalomalacia to suggest chronic cortical infarction elsewhere. No foci of susceptibility artifact to suggest acute or chronic intracranial hemorrhage. No mass lesion, midline shift or mass effect. No hydrocephalus or extra-axial fluid collection. Pituitary gland suprasellar region normal. Midline structures intact. Vascular: Major intracranial vascular flow voids are maintained. Increased FLAIR signal intensity without associated T1 correlate within the left transverse and sigmoid sinus is likely related to slow/sluggish flow. No frank imaging findings to  suggest dural venous thrombosis. Skull and upper cervical spine: Craniocervical junction within normal limits. Bone marrow signal intensity normal. No scalp soft tissue abnormality. Sinuses/Orbits: Globes and orbital soft tissues within normal limits. Paranasal sinuses are clear. No mastoid effusion. Inner ear structures normal. Other: None. IMPRESSION: 1. Patchy small volume acute to early subacute left PCA distribution infarct as above. No associated hemorrhage or mass effect. 2. Otherwise normal brain MRI for age. Electronically Signed   By: Rise Mu M.D.   On: 10/04/2020 19:07   ECHOCARDIOGRAM COMPLETE  Result Date: 10/05/2020    ECHOCARDIOGRAM REPORT   Patient Name:   BILLIE-JO RIDGLEY Date of Exam: 10/05/2020 Medical Rec #:  [id number]   Height:       66.5 in Accession #:    [id number]  Weight:       135.0 lb Date of Birth:  [birth year]   BSA:          1.702 m Patient Age:    79 years    BP:           154/78 mmHg Patient Gender: F           HR:           87 bpm. Exam Location:  Inpatient Procedure: 2D Echo, Color Doppler and Cardiac Doppler Indications:    Stroke 434.91 / I163.9  History:        Patient has no prior history of Echocardiogram examinations.                 Risk Factors:Hypertension.  Sonographer:    Eulah Pont RDCS Referring Phys: [id number] TIMOTHY S OPYD IMPRESSIONS  1. Left ventricular ejection fraction, by estimation, is 65 to 70%. The left ventricle has normal function. The left ventricle has no regional wall motion abnormalities. Left ventricular diastolic parameters are consistent with Grade I diastolic dysfunction (impaired relaxation).  2. Right ventricular systolic function is normal. The right ventricular size is normal. There is normal pulmonary artery systolic pressure.  3. The mitral valve is normal in structure. Trivial mitral valve regurgitation. No evidence of mitral stenosis.  4. The aortic valve is tricuspid. Aortic valve regurgitation is not visualized. No aortic  stenosis is present.  5. The inferior vena cava is normal in size with greater than 50% respiratory variability, suggesting right atrial pressure of 3 mmHg. FINDINGS  Left Ventricle: Left ventricular ejection fraction, by estimation, is 65 to 70%. The left ventricle has normal function. The left ventricle has no regional wall motion abnormalities. The left ventricular internal cavity size was normal in size. There is  no left ventricular hypertrophy. Left ventricular diastolic parameters are consistent with Grade I diastolic dysfunction (impaired relaxation). Normal left ventricular filling pressure. Right Ventricle: The right ventricular size is normal. No increase in right ventricular wall thickness. Right ventricular systolic function is normal. There is normal pulmonary artery systolic pressure. The tricuspid  regurgitant velocity is 1.33 m/s, and  with an assumed right atrial pressure of 3 mmHg, the estimated right ventricular systolic pressure is [id number]. Left Atrium: Left atrial size was normal in size. Right Atrium: Right atrial size was normal in size. Pericardium: There is no evidence of pericardial effusion. Mitral Valve: The mitral valve is normal in structure. Trivial mitral valve regurgitation. No evidence of mitral valve stenosis. Tricuspid Valve: The tricuspid valve is normal in structure. Tricuspid valve regurgitation is trivial. No evidence of tricuspid stenosis. Aortic Valve: The aortic valve is tricuspid. Aortic valve regurgitation is not visualized. No aortic stenosis is present. Pulmonic Valve: The pulmonic valve was normal in structure. Pulmonic valve regurgitation is not visualized. No evidence of pulmonic stenosis. Aorta: The aortic root is normal in size and structure. Venous: The inferior vena cava is normal in size with greater than 50% respiratory variability, suggesting right atrial pressure of 3 mmHg. IAS/Shunts: No atrial level shunt detected by color flow Doppler.  LEFT VENTRICLE  PLAX 2D LVIDd:         4.50 cm  Diastology LVIDs:         3.30 cm  LV e' medial:    7.94 cm/s LV PW:         0.80 cm  LV E/e' medial:  7.3 LV IVS:        1.00 cm  LV e' lateral:   10.40 cm/s LVOT diam:     1.90 cm  LV E/e' lateral: 5.6 LV SV:         77 LV SV Index:   45 LVOT Area:     2.84 cm  RIGHT VENTRICLE RV S prime:     20.40 cm/s TAPSE (M-mode): 2.3 cm LEFT ATRIUM             Index       RIGHT ATRIUM           Index LA diam:        3.00 cm 1.76 cm/m  RA Area:     14.10 cm LA Vol (A2C):   40.4 ml 23.74 ml/m RA Volume:   32.00 ml  18.81 ml/m LA Vol (A4C):   40.6 ml 23.86 ml/m LA Biplane Vol: 44.6 ml 26.21 ml/m  AORTIC VALVE LVOT Vmax:   122.00 cm/s LVOT Vmean:  94.300 cm/s LVOT VTI:    0.272 m  AORTA Ao Root diam: 3.30 cm MITRAL VALVE                TRICUSPID VALVE MV Area (PHT): 2.29 cm     TR Peak grad:   7.1 mmHg MV Decel Time: 331 msec     TR Vmax:        133.00 cm/s MV E velocity: 58.00 cm/s MV A velocity: 108.00 cm/s  SHUNTS MV E/A ratio:  0.54         Systemic VTI:  0.27 m                             Systemic Diam: 1.90 cm Skeet Latch MD Electronically signed by Skeet Latch MD Signature Date/Time: 10/05/2020/9:38:46 AM    Final     Scheduled Meds: .  stroke: mapping our early stages of recovery book   Does not apply Once  . clopidogrel  75 mg Oral Daily   Continuous Infusions: . sodium chloride 100 mL/hr at 10/05/20 1147     LOS: 0 days  Time spent: 35 minutes   Darliss Cheney, MD [facility]  10/05/2020, 1:18 PM   To contact the attending provider between 7A-7P or the covering provider during after hours 7P-7A, please log into the web site [email or web address].

## 2020-10-05 NOTE — Plan of Care (Signed)
  Problem: Education: Goal: Knowledge of disease or condition will improve Outcome: Progressing Goal: Individualized Educational Video(s) Outcome: Progressing

## 2020-10-05 NOTE — Progress Notes (Signed)
  Echocardiogram 2D Echocardiogram has been performed.  Gwendolyn Morrow 10/05/2020, 8:49 AM

## 2020-10-06 ENCOUNTER — Encounter (HOSPITAL_COMMUNITY)
Admission: EM | Disposition: A | Discharge status: Home / Self Care | Payer: Self-pay | Source: Home / Self Care | Attending: Internal Medicine

## 2020-10-06 ENCOUNTER — Encounter (HOSPITAL_COMMUNITY): Payer: Self-pay | Admitting: Family Medicine

## 2020-10-06 DIAGNOSIS — I639 Cerebral infarction, unspecified: Secondary | ICD-10-CM | POA: Diagnosis not present

## 2020-10-06 HISTORY — PX: LOOP RECORDER INSERTION: EP1214

## 2020-10-06 SURGERY — LOOP RECORDER INSERTION

## 2020-10-06 MED ORDER — LIDOCAINE-EPINEPHRINE 1 %-1:100000 IJ SOLN
INTRAMUSCULAR | Status: AC
  Filled 2020-10-06: qty 1

## 2020-10-06 MED ORDER — LIDOCAINE-EPINEPHRINE 1 %-1:100000 IJ SOLN
INTRAMUSCULAR | Status: DC | PRN
  Administered 2020-10-06: 20 mL

## 2020-10-06 SURGICAL SUPPLY — 2 items
MONITOR REVEAL LINQ II (Prosthesis & Implant Heart) ×1 IMPLANT
PACK LOOP INSERTION (CUSTOM PROCEDURE TRAY) ×2 IMPLANT

## 2020-10-06 NOTE — Interval H&P Note (Signed)
History and Physical Interval Note:  10/06/2020 5:21 PM  Gwendolyn Morrow  has presented today for surgery, with the diagnosis of stroke.  The various methods of treatment have been discussed with the patient and family. After consideration of risks, benefits and other options for treatment, the patient has consented to  Procedure(s): LOOP RECORDER INSERTION (N/A) as a surgical intervention.  The patient's history has been reviewed, patient examined, no change in status, stable for surgery.  I have reviewed the patient's chart and labs.  Questions were answered to the patient's satisfaction.     Thompson Grayer

## 2020-10-06 NOTE — Progress Notes (Signed)
PROGRESS NOTE  Gwendolyn Morrow QVZ:[id number] DOB: 05-15-1941 DOA: 10/04/2020 PCP: Lujean Amel, MD   LOS: 1 day   Brief narrative:  Gwendolyn Morrow a 80 y.o.femalewith medical history of hypertension presented to ED with complaint of right-sided numbness and weakness and difficulty concentrating.  Patient initially had minor symptoms 3 days back but 1 day prior to presentation started developing numbness of the right upper and lower extremity including the face with weakness.  In the ED, patient was found to be afebrile, saturating well on room air, tachycardic in the 110s initially, and with modest elevation of blood pressure. EKG featured a sinus rhythm. Chemistry panel was unremarkable and CBC was notable for slight polycythemia. MRI brain revealed patchy small volume acute to early subacute PCA distribution infarction. CTA head and neck was notable for severe stenosis involving the proximal P2 segment of left PCA. Neurology was consulted by the ED physician, Plavix and IV fluids given in the ED. Patient was then admitted to hospital for further evaluation and treatment.  Assessment/Plan:  Principal Problem:   Acute ischemic stroke [facility]) Active Problems:   Hypertension   Acute CVA (cerebrovascular accident) ([place])  Acute subacute PCA territory infarct  Patient presenting with right-sided numbness and questionable weakness.  MRI brain revealed patchy small volume acute to early subacute left PCA distribution infarction without mass-effect or hemorrhage.  CTA head and neck reveals high-grade stenosis involving left PCA, proximal P2 segment.  Neurology on board.    On aspirin and Plavix.   Currently allowing permissive hypertension.  Symptoms have improved at this time.  Hemoglobin A1c of 5.3.  COVID-19 negative.  Urinalysis was negative.  PT OT speech evaluation.  Neurology recommended outpatient neurology follow-up in 4 weeks.  Neurology also recommended loop monitor/30-day monitor on  discharge.  Cardiology has been notified as per neurology.  Essential hypertension: Antihypertensive medications on hold.  Hyperlipidemia: LDL 90.    On Lipitor 20 at home.  This has been increased to 40 g daily.  GERD: On PPI.  DVT prophylaxis: Lovenox subcu    Code Status: Full code  Family Communication: None   Status is: Inpatient  Remains inpatient appropriate because:Inpatient level of care appropriate due to severity of illness neurology evaluation, need for monitoring.   Dispo: The patient is from: Home              Anticipated d/c is to: Home              Anticipated d/c date is: 1 day              Patient currently is not medically stable to d/c.       Consultants: Neurology  Procedures:  None  Anti-infectives:  . None  Anti-infectives (From admission, onward)   None       Subjective: Today, patient was seen and examined at bedside.  States that her symptoms have mostly resolved except for some word finding difficulty.  Objective: Vitals:   10/06/20 0315 10/06/20 0726  BP: 137/68 140/82  Pulse: 75 79  Resp: 16 18  Temp: 98.1 F (36.7 C) 98.4 F (36.9 C)  SpO2: 94% 97%    Intake/Output Summary (Last 24 hours) at 10/06/2020 0809 Last data filed at 10/05/2020 2321 Gross per 24 hour  Intake -  Output 600 ml  Net -600 ml   Filed Weights   10/04/20 1128  Weight: 61.2 kg   Body mass index is 21.46 kg/m.   Physical Exam:  GENERAL: Patient is alert awake and oriented. Not in obvious distress.  Thinly built HENT: No scleral pallor or icterus. Pupils equally reactive to light. Oral mucosa is moist NECK: is supple, no gross swelling noted. CHEST: Clear to auscultation. No crackles or wheezes.  Diminished breath sounds bilaterally. CVS: S1 and S2 heard, no murmur. Regular rate and rhythm.  ABDOMEN: Soft, non-tender, bowel sounds are present. EXTREMITIES: No edema. CNS: Cranial nerves are intact. No focal motor deficits. SKIN: warm  and dry without rashes.  Data Review: I have personally reviewed the following laboratory data and studies,  CBC: Recent Labs  Lab 10/04/20 1321 10/04/20 1353  WBC 8.5  --   NEUTROABS 6.4  --   HGB 15.7* 15.6*  HCT 46.4* 46.0  MCV 89.4  --   PLT 260  --    Basic Metabolic Panel: Recent Labs  Lab 10/04/20 1321 10/04/20 1353 10/04/20 1800  NA 141 139  --   K 3.6 3.6  --   CL 103 103  --   CO2 25  --   --   GLUCOSE 103* 100*  --   BUN 16 17  --   CREATININE 0.83 0.80  --   CALCIUM 10.0  --   --   MG  --   --  2.0   Liver Function Tests: Recent Labs  Lab 10/04/20 1321  AST 23  ALT 20  ALKPHOS 95  BILITOT 0.8  PROT 8.0  ALBUMIN 4.9   No results for input(s): LIPASE, AMYLASE in the last 168 hours. No results for input(s): AMMONIA in the last 168 hours. Cardiac Enzymes: No results for input(s): CKTOTAL, CKMB, CKMBINDEX, TROPONINI in the last 168 hours. BNP (last 3 results) No results for input(s): BNP in the last 8760 hours.  ProBNP (last 3 results) No results for input(s): PROBNP in the last 8760 hours.  CBG: Recent Labs  Lab 10/04/20 1126  GLUCAP 117*   Recent Results (from the past 240 hour(s))  SARS CORONAVIRUS 2 (TAT 6-24 HRS) Nasopharyngeal Nasopharyngeal Swab     Status: None   Collection Time: 10/04/20  7:17 PM   Specimen: Nasopharyngeal Swab  Result Value Ref Range Status   SARS Coronavirus 2 NEGATIVE NEGATIVE Final    Comment: (NOTE) SARS-CoV-2 target nucleic acids are NOT DETECTED.  The SARS-CoV-2 RNA is generally detectable in upper and lower respiratory specimens during the acute phase of infection. Negative results do not preclude SARS-CoV-2 infection, do not rule out co-infections with other pathogens, and should not be used as the sole basis for treatment or other patient management decisions. Negative results must be combined with clinical observations, patient history, and epidemiological information. The expected result is  Negative.  Fact Sheet for Patients: SugarRoll.be  Fact Sheet for Healthcare Providers: https://www.woods-mathews.com/  This test is not yet approved or cleared by the [place] FDA and  has been authorized for detection and/or diagnosis of SARS-CoV-2 by FDA under an Emergency Use Authorization (EUA). This EUA will remain  in effect (meaning this test can be used) for the duration of the COVID-19 declaration under Se ction 564(b)(1) of the Act, 21 U.S.C. section 360bbb-3(b)(1), unless the authorization is terminated or revoked sooner.  Performed at [facility], [address] [place], [place] [address]      Studies: CT ANGIO HEAD W OR WO CONTRAST  Result Date: 10/04/2020 CLINICAL DATA:  Neuro deficit, acute, stroke suspected. Additional history provided: Patient reports intermittent numbness and tingling in  left arm, leg and mouth for 3 days. EXAM: CT ANGIOGRAPHY HEAD AND NECK TECHNIQUE: Multidetector CT imaging of the head and neck was performed using the standard protocol during bolus administration of intravenous contrast. Multiplanar CT image reconstructions and MIPs were obtained to evaluate the vascular anatomy. Carotid stenosis measurements (when applicable) are obtained utilizing NASCET criteria, using the distal internal carotid diameter as the denominator. CONTRAST:  36mL OMNIPAQUE IOHEXOL 350 MG/ML SOLN COMPARISON:  Brain MRI performed earlier the same day 10/04/2020. FINDINGS: CT HEAD FINDINGS Brain: Patchy small volume cortical and subcortical acute/early subacute infarcts within the left PCA territory involving the left temporal and occipital lobes, better appreciated on the brain MRI performed earlier today. Redemonstrated associated acute/early subacute infarct within the left thalamus. No evidence of hemorrhagic conversion. No extra-axial fluid collection. No evidence of intracranial mass. No midline shift. Vascular: No  hyperdense vessel.  Atherosclerotic calcifications. Skull: Normal. Negative for fracture or focal lesion. Sinuses: Mild bilateral ethmoid mucosal thickening. Orbits: No mass or acute finding. Review of the MIP images confirms the above findings CTA NECK FINDINGS Aortic arch: Common origin of the innominate and left common carotid arteries. Atherosclerotic plaque within the visualized aortic arch and proximal major branch vessels of the neck. No hemodynamically significant innominate or proximal subclavian artery stenosis. Right carotid system: CCA and ICA patent within the neck without significant stenosis (50% or greater). Mild soft and calcified plaque within the carotid bifurcation and proximal ICA. Left carotid system: CCA and ICA patent within the neck without significant stenosis (50% or greater). Mild soft plaque within the proximal to mid CCA. Mild soft and calcified plaque within the carotid bifurcation and proximal ICA. Tortuosity and ectasia of the mid to distal cervical ICA, which measures up to 8 mm in diameter. Vertebral arteries: Patent within the neck bilaterally. The left vertebral artery is slightly dominant. Nonstenotic atherosclerotic plaque at the origin of both vertebral arteries. Skeleton: No acute bony abnormality or aggressive osseous lesion. Cervical spondylosis with multilevel disc space narrowing, disc bulges, uncovertebral hypertrophy and facet arthrosis. Disc space narrowing is moderate to moderately advanced at C6-C7. Fusion across the right C2-C3 facet joint. Grade 1 anterolisthesis at C3-C4, C4-C5, C5-C6 and C7-T1. Other neck: No neck mass or cervical lymphadenopathy. Upper chest: No consolidation within the imaged lung apices. Review of the MIP images confirms the above findings CTA HEAD FINDINGS Anterior circulation: The intracranial internal carotid arteries are patent. Nonstenotic calcified plaque within both vessels. The M1 middle cerebral arteries are patent. No M2 proximal  branch occlusion or high-grade proximal stenosis is identified. Markedly hypoplastic or developmentally absent A1 right ACA. The anterior cerebral arteries are otherwise patent. No intracranial aneurysm is identified. Posterior circulation: The non dominant right vertebral artery is patent and appears to terminate predominantly as the right PICA. The dominant intracranial left vertebral artery is patent. The basilar artery is patent. Predominantly fetal origin right posterior cerebral artery. A sizable left posterior communicating artery is also present. The posterior cerebral arteries are patent. Severe stenosis within the proximal P2 left PCA (series 17, image 21). Venous sinuses: Within the limitations of contrast timing, no convincing thrombus. Anatomic variants: As described Review of the MIP images confirms the above findings IMPRESSION: CT head: Patchy small-volume acute/early subacute infarcts within the left temporal occipital lobes as well as left thalamus (left PCA vascular territory), some of which were better appreciated on the MRI performed earlier today. No evidence of hemorrhagic conversion. CTA neck: 1. The common and internal carotid arteries  are patent within the neck without hemodynamically significant stenosis. Mild atherosclerotic plaque within both carotid systems as described. 2. Nonspecific tortuosity and ectasia of the mid to distal cervical left ICA, which measures up to 8 mm in diameter. 3. Vertebral arteries patent within the neck bilaterally. Nonstenotic calcified plaque at the origins of both vessels. 4.  Aortic Atherosclerosis (ICD10-I70.0). CTA head: 1. Severe stenosis within the proximal P2 segment of the left posterior cerebral artery. 2. Nonstenotic calcified plaque within the intracranial internal carotid arteries bilaterally. Electronically Signed   By: Kellie Simmering DO   On: 10/04/2020 20:21   CT ANGIO NECK W OR WO CONTRAST  Result Date: 10/04/2020 CLINICAL DATA:  Neuro  deficit, acute, stroke suspected. Additional history provided: Patient reports intermittent numbness and tingling in left arm, leg and mouth for 3 days. EXAM: CT ANGIOGRAPHY HEAD AND NECK TECHNIQUE: Multidetector CT imaging of the head and neck was performed using the standard protocol during bolus administration of intravenous contrast. Multiplanar CT image reconstructions and MIPs were obtained to evaluate the vascular anatomy. Carotid stenosis measurements (when applicable) are obtained utilizing NASCET criteria, using the distal internal carotid diameter as the denominator. CONTRAST:  43mL OMNIPAQUE IOHEXOL 350 MG/ML SOLN COMPARISON:  Brain MRI performed earlier the same day 10/04/2020. FINDINGS: CT HEAD FINDINGS Brain: Patchy small volume cortical and subcortical acute/early subacute infarcts within the left PCA territory involving the left temporal and occipital lobes, better appreciated on the brain MRI performed earlier today. Redemonstrated associated acute/early subacute infarct within the left thalamus. No evidence of hemorrhagic conversion. No extra-axial fluid collection. No evidence of intracranial mass. No midline shift. Vascular: No hyperdense vessel.  Atherosclerotic calcifications. Skull: Normal. Negative for fracture or focal lesion. Sinuses: Mild bilateral ethmoid mucosal thickening. Orbits: No mass or acute finding. Review of the MIP images confirms the above findings CTA NECK FINDINGS Aortic arch: Common origin of the innominate and left common carotid arteries. Atherosclerotic plaque within the visualized aortic arch and proximal major branch vessels of the neck. No hemodynamically significant innominate or proximal subclavian artery stenosis. Right carotid system: CCA and ICA patent within the neck without significant stenosis (50% or greater). Mild soft and calcified plaque within the carotid bifurcation and proximal ICA. Left carotid system: CCA and ICA patent within the neck without  significant stenosis (50% or greater). Mild soft plaque within the proximal to mid CCA. Mild soft and calcified plaque within the carotid bifurcation and proximal ICA. Tortuosity and ectasia of the mid to distal cervical ICA, which measures up to 8 mm in diameter. Vertebral arteries: Patent within the neck bilaterally. The left vertebral artery is slightly dominant. Nonstenotic atherosclerotic plaque at the origin of both vertebral arteries. Skeleton: No acute bony abnormality or aggressive osseous lesion. Cervical spondylosis with multilevel disc space narrowing, disc bulges, uncovertebral hypertrophy and facet arthrosis. Disc space narrowing is moderate to moderately advanced at C6-C7. Fusion across the right C2-C3 facet joint. Grade 1 anterolisthesis at C3-C4, C4-C5, C5-C6 and C7-T1. Other neck: No neck mass or cervical lymphadenopathy. Upper chest: No consolidation within the imaged lung apices. Review of the MIP images confirms the above findings CTA HEAD FINDINGS Anterior circulation: The intracranial internal carotid arteries are patent. Nonstenotic calcified plaque within both vessels. The M1 middle cerebral arteries are patent. No M2 proximal branch occlusion or high-grade proximal stenosis is identified. Markedly hypoplastic or developmentally absent A1 right ACA. The anterior cerebral arteries are otherwise patent. No intracranial aneurysm is identified. Posterior circulation: The non dominant right  vertebral artery is patent and appears to terminate predominantly as the right PICA. The dominant intracranial left vertebral artery is patent. The basilar artery is patent. Predominantly fetal origin right posterior cerebral artery. A sizable left posterior communicating artery is also present. The posterior cerebral arteries are patent. Severe stenosis within the proximal P2 left PCA (series 17, image 21). Venous sinuses: Within the limitations of contrast timing, no convincing thrombus. Anatomic variants:  As described Review of the MIP images confirms the above findings IMPRESSION: CT head: Patchy small-volume acute/early subacute infarcts within the left temporal occipital lobes as well as left thalamus (left PCA vascular territory), some of which were better appreciated on the MRI performed earlier today. No evidence of hemorrhagic conversion. CTA neck: 1. The common and internal carotid arteries are patent within the neck without hemodynamically significant stenosis. Mild atherosclerotic plaque within both carotid systems as described. 2. Nonspecific tortuosity and ectasia of the mid to distal cervical left ICA, which measures up to 8 mm in diameter. 3. Vertebral arteries patent within the neck bilaterally. Nonstenotic calcified plaque at the origins of both vessels. 4.  Aortic Atherosclerosis (ICD10-I70.0). CTA head: 1. Severe stenosis within the proximal P2 segment of the left posterior cerebral artery. 2. Nonstenotic calcified plaque within the intracranial internal carotid arteries bilaterally. Electronically Signed   By: Kellie Simmering DO   On: 10/04/2020 20:21   MR BRAIN WO CONTRAST  Result Date: 10/04/2020 CLINICAL DATA:  Initial evaluation for acute TIA, right-sided numbness and tingling. Weakness. EXAM: MRI HEAD WITHOUT CONTRAST TECHNIQUE: Multiplanar, multiecho pulse sequences of the brain and surrounding structures were obtained without intravenous contrast. COMPARISON:  Previous MRI from 01/21/2013. FINDINGS: Brain: Cerebral volume within normal limits for age. No significant cerebral white matter disease. Patchy multifocal areas of small volume restricted diffusion seen involving the parasagittal left temporal occipital region, consistent with an acute to early subacute left PCA distribution infarct (series 5, image 76). Subtle patchy involvement of the left thalamus noted. No associated hemorrhage or mass effect. No other diffusion abnormality to suggest acute or subacute ischemia elsewhere within  the brain. Gray-white matter differentiation otherwise maintained. No encephalomalacia to suggest chronic cortical infarction elsewhere. No foci of susceptibility artifact to suggest acute or chronic intracranial hemorrhage. No mass lesion, midline shift or mass effect. No hydrocephalus or extra-axial fluid collection. Pituitary gland suprasellar region normal. Midline structures intact. Vascular: Major intracranial vascular flow voids are maintained. Increased FLAIR signal intensity without associated T1 correlate within the left transverse and sigmoid sinus is likely related to slow/sluggish flow. No frank imaging findings to suggest dural venous thrombosis. Skull and upper cervical spine: Craniocervical junction within normal limits. Bone marrow signal intensity normal. No scalp soft tissue abnormality. Sinuses/Orbits: Globes and orbital soft tissues within normal limits. Paranasal sinuses are clear. No mastoid effusion. Inner ear structures normal. Other: None. IMPRESSION: 1. Patchy small volume acute to early subacute left PCA distribution infarct as above. No associated hemorrhage or mass effect. 2. Otherwise normal brain MRI for age. Electronically Signed   By: Jeannine Boga M.D.   On: 10/04/2020 19:07   ECHOCARDIOGRAM COMPLETE  Result Date: 10/05/2020    ECHOCARDIOGRAM REPORT   Patient Name:   Gwendolyn Morrow Date of Exam: 10/05/2020 Medical Rec #:  [id number]   Height:       66.5 in Accession #:    [id number]  Weight:       135.0 lb Date of Birth:  Jun 07, 1941   BSA:  1.702 m Patient Age:    77 years    BP:           154/78 mmHg Patient Gender: F           HR:           87 bpm. Exam Location:  Inpatient Procedure: 2D Echo, Color Doppler and Cardiac Doppler Indications:    Stroke 434.91 / I163.9  History:        Patient has no prior history of Echocardiogram examinations.                 Risk Factors:Hypertension.  Sonographer:    Bernadene Person RDCS Referring Phys: [id number] [place]  1. Left ventricular ejection fraction, by estimation, is 65 to 70%. The left ventricle has normal function. The left ventricle has no regional wall motion abnormalities. Left ventricular diastolic parameters are consistent with Grade I diastolic dysfunction (impaired relaxation).  2. Right ventricular systolic function is normal. The right ventricular size is normal. There is normal pulmonary artery systolic pressure.  3. The mitral valve is normal in structure. Trivial mitral valve regurgitation. No evidence of mitral stenosis.  4. The aortic valve is tricuspid. Aortic valve regurgitation is not visualized. No aortic stenosis is present.  5. The inferior vena cava is normal in size with greater than 50% respiratory variability, suggesting right atrial pressure of 3 mmHg. FINDINGS  Left Ventricle: Left ventricular ejection fraction, by estimation, is 65 to 70%. The left ventricle has normal function. The left ventricle has no regional wall motion abnormalities. The left ventricular internal cavity size was normal in size. There is  no left ventricular hypertrophy. Left ventricular diastolic parameters are consistent with Grade I diastolic dysfunction (impaired relaxation). Normal left ventricular filling pressure. Right Ventricle: The right ventricular size is normal. No increase in right ventricular wall thickness. Right ventricular systolic function is normal. There is normal pulmonary artery systolic pressure. The tricuspid regurgitant velocity is 1.33 m/s, and  with an assumed right atrial pressure of 3 mmHg, the estimated right ventricular systolic pressure is [id number]. Left Atrium: Left atrial size was normal in size. Right Atrium: Right atrial size was normal in size. Pericardium: There is no evidence of pericardial effusion. Mitral Valve: The mitral valve is normal in structure. Trivial mitral valve regurgitation. No evidence of mitral valve stenosis. Tricuspid Valve: The tricuspid valve is  normal in structure. Tricuspid valve regurgitation is trivial. No evidence of tricuspid stenosis. Aortic Valve: The aortic valve is tricuspid. Aortic valve regurgitation is not visualized. No aortic stenosis is present. Pulmonic Valve: The pulmonic valve was normal in structure. Pulmonic valve regurgitation is not visualized. No evidence of pulmonic stenosis. Aorta: The aortic root is normal in size and structure. Venous: The inferior vena cava is normal in size with greater than 50% respiratory variability, suggesting right atrial pressure of 3 mmHg. IAS/Shunts: No atrial level shunt detected by color flow Doppler.  LEFT VENTRICLE PLAX 2D LVIDd:         4.50 cm  Diastology LVIDs:         3.30 cm  LV e' medial:    7.94 cm/s LV PW:         0.80 cm  LV E/e' medial:  7.3 LV IVS:        1.00 cm  LV e' lateral:   10.40 cm/s LVOT diam:     1.90 cm  LV E/e' lateral: 5.6 LV SV:  77 LV SV Index:   45 LVOT Area:     2.84 cm  RIGHT VENTRICLE RV S prime:     20.40 cm/s TAPSE (M-mode): 2.3 cm LEFT ATRIUM             Index       RIGHT ATRIUM           Index LA diam:        3.00 cm 1.76 cm/m  RA Area:     14.10 cm LA Vol (A2C):   40.4 ml 23.74 ml/m RA Volume:   32.00 ml  18.81 ml/m LA Vol (A4C):   40.6 ml 23.86 ml/m LA Biplane Vol: 44.6 ml 26.21 ml/m  AORTIC VALVE LVOT Vmax:   122.00 cm/s LVOT Vmean:  94.300 cm/s LVOT VTI:    0.272 m  AORTA Ao Root diam: 3.30 cm MITRAL VALVE                TRICUSPID VALVE MV Area (PHT): 2.29 cm     TR Peak grad:   7.1 mmHg MV Decel Time: 331 msec     TR Vmax:        133.00 cm/s MV E velocity: 58.00 cm/s MV A velocity: 108.00 cm/s  SHUNTS MV E/A ratio:  0.54         Systemic VTI:  0.27 m                             Systemic Diam: 1.90 cm Skeet Latch MD Electronically signed by Skeet Latch MD Signature Date/Time: 10/05/2020/9:38:46 AM    Final       Flora Lipps, MD  [facility] 10/06/2020  If 7PM-7AM, please contact night-coverage

## 2020-10-06 NOTE — Evaluation (Signed)
Physical Therapy Evaluation Patient Details Name: Gwendolyn Morrow MRN: [id number] DOB: 06/19/1941 Today's Date: 10/06/2020   History of Present Illness  80 y.o. female with a PMHx of HTN and HLD who presented to the [facility] ED on 10/04/20 with c/o right sided numbness from shoulder to foot with slight right arm weakness and difficulty concentrating.  Clinical Impression  Pt presents to PT with deficits in balance and gait but is able to transfer and perform bed mobility independently at this time. Pt with one loss of balance with vertical head turns but is otherwise mobilizing well and without physical assistance. Pt will benefit from aggressive mobilization and continued acute PT POC to improve gait and balance quality in an effort to restore independence. PT recommends discharge home, no PT or DME needs.    Follow Up Recommendations No PT follow up    Equipment Recommendations  None recommended by PT    Recommendations for Other Services       Precautions / Restrictions Precautions Precautions: Fall Restrictions Weight Bearing Restrictions: No      Mobility  Bed Mobility Overal bed mobility: Modified Independent             General bed mobility comments: increased time, HOB elevated    Transfers Overall transfer level: Independent Equipment used: None                Ambulation/Gait Ambulation/Gait assistance: Supervision;Min guard (minG for one LOB with vertical head turns) Gait Distance (Feet): 250 Feet Assistive device: None Gait Pattern/deviations: Step-through pattern Gait velocity: functional Gait velocity interpretation: 1.31 - 2.62 ft/sec, indicative of limited community ambulator General Gait Details: pt with steady step through gait, one LOB with vertical head turns but is able to perform horizontal head turns, change gait speed, stop abruptly, step over objects, and make quick turns without LOB  Stairs Stairs: Yes Stairs assistance: Supervision Stair  Management: One rail Right;Step to pattern Number of Stairs: 4    Wheelchair Mobility    Modified Rankin (Stroke Patients Only) Modified Rankin (Stroke Patients Only) Pre-Morbid Rankin Score: No symptoms Modified Rankin: No significant disability     Balance Overall balance assessment: Mild deficits observed, not formally tested                                           Pertinent Vitals/Pain Pain Assessment: No/denies pain    Home Living Family/patient expects to be discharged to:: Private residence Living Arrangements: Alone Available Help at Discharge: Family;Neighbor;Friend(s);Available 24 hours/day Type of Home: House Home Access: Stairs to enter Entrance Stairs-Rails: Can reach both Entrance Stairs-Number of Steps: 4 Home Layout: Two level;Able to live on main level with bedroom/bathroom Home Equipment: Gilford Rile - 2 wheels      Prior Function Level of Independence: Independent         Comments: likes tennis, hunting and fishing     Hand Dominance        Extremity/Trunk Assessment   Upper Extremity Assessment Upper Extremity Assessment: Overall WFL for tasks assessed    Lower Extremity Assessment Lower Extremity Assessment: Overall WFL for tasks assessed    Cervical / Trunk Assessment Cervical / Trunk Assessment: Normal  Communication   Communication: No difficulties  Cognition Arousal/Alertness: Awake/alert Behavior During Therapy: WFL for tasks assessed/performed Overall Cognitive Status: Within Functional Limits for tasks assessed  General Comments General comments (skin integrity, edema, etc.): VSS on RA    Exercises     Assessment/Plan    PT Assessment Patient needs continued PT services  PT Problem List Decreased balance;Decreased mobility       PT Treatment Interventions DME instruction;Gait training;Stair training;Functional mobility training;Balance  training;Neuromuscular re-education;Therapeutic activities;Patient/family education    PT Goals (Current goals can be found in the Care Plan section)  Acute Rehab PT Goals Patient Stated Goal: to go home PT Goal Formulation: With patient Time For Goal Achievement: 10/20/20 Potential to Achieve Goals: Good Additional Goals Additional Goal #1: Pt will score >19/24 on DGI to indicate a reduced risk for falls    Frequency Min 4X/week   Barriers to discharge        Co-evaluation               AM-PAC PT "6 Clicks" Mobility  Outcome Measure Help needed turning from your back to your side while in a flat bed without using bedrails?: None Help needed moving from lying on your back to sitting on the side of a flat bed without using bedrails?: None Help needed moving to and from a bed to a chair (including a wheelchair)?: None Help needed standing up from a chair using your arms (e.g., wheelchair or bedside chair)?: None Help needed to walk in hospital room?: A Little Help needed climbing 3-5 steps with a railing? : A Little 6 Click Score: 22    End of Session   Activity Tolerance: Patient tolerated treatment well Patient left: in chair;with call bell/phone within reach Nurse Communication: Mobility status PT Visit Diagnosis: Other abnormalities of gait and mobility (R26.89);Unsteadiness on feet (R26.81)    Time: 2426-8341 PT Time Calculation (min) (ACUTE ONLY): 24 min   Charges:   PT Evaluation $PT Eval Low Complexity: [place], PT, DPT Acute Rehabilitation Pager: [phone]   Gwendolyn Morrow 10/06/2020, 9:49 AM

## 2020-10-06 NOTE — Plan of Care (Signed)
  Problem: Education: Goal: Knowledge of disease or condition will improve Outcome: Progressing Goal: Knowledge of secondary prevention will improve Outcome: Progressing   

## 2020-10-06 NOTE — Progress Notes (Signed)
STROKE TEAM PROGRESS NOTE   INTERVAL HISTORY No acute events. Patient states she is doing well.  Her neurological deficits have all resolved.  MRI scan shows patchy left PCA embolic infarct.  CT angiogram shows fetal origin of the left posterior cerebral artery which shows stenosis in the P2 segment.  Echocardiogram shows ejection fraction 65 to 70%.  LDL cholesterol 90.  A1c is 5.3. Stroke diagnosis, work up and plan of care were discussed with patient.   Vitals:   10/05/20 2320 10/06/20 0315 10/06/20 0726 10/06/20 1213  BP: (!) 151/69 137/68 140/82 (!) 153/77  Pulse: 71 75 79 89  Resp: 18 16 18 18   Temp: 98.2 F (36.8 C) 98.1 F (36.7 C) 98.4 F (36.9 C) 98.4 F (36.9 C)  TempSrc: Oral Oral Oral Oral  SpO2: 95% 94% 97% 98%  Weight:      Height:       CBC:  Recent Labs  Lab 10/04/20 1321 10/04/20 1353  WBC 8.5  --   NEUTROABS 6.4  --   HGB 15.7* 15.6*  HCT 46.4* 46.0  MCV 89.4  --   PLT 260  --    Basic Metabolic Panel:  Recent Labs  Lab 10/04/20 1321 10/04/20 1353 10/04/20 1800  NA 141 139  --   K 3.6 3.6  --   CL 103 103  --   CO2 25  --   --   GLUCOSE 103* 100*  --   BUN 16 17  --   CREATININE 0.83 0.80  --   CALCIUM 10.0  --   --   MG  --   --  2.0   Lipid Panel:  Recent Labs  Lab 10/04/20 1932  CHOL 169  TRIG 101  HDL 59  CHOLHDL 2.9  VLDL 20  LDLCALC 90   HgbA1c:  Recent Labs  Lab 10/04/20 1932  HGBA1C 5.3   Urine Drug Screen: No results for input(s): LABOPIA, COCAINSCRNUR, LABBENZ, AMPHETMU, THCU, LABBARB in the last 168 hours.  Alcohol Level No results for input(s): ETH in the last 168 hours.  IMAGING past 24 hours No results found.  PHYSICAL EXAM  Physical Exam  Constitutional: Appears well-developed and well-nourished.  Psych: Affect appropriate to situation Eyes: No scleral injection HENT: No OP obstrucion Head: Normocephalic.  Cardiovascular: RRR Respiratory: Effort normal.  GI: Soft.  Skin: WDI  Neuro: Mental  Status: Patient is awake, alert, oriented to person, place, month, year, and situation. Patient is able to give a clear and coherent history. No signs of aphasia or neglect. Speech/Language: Speech is intact and fluent. No dysarthria Comprehension, repetition, and naming intact.  Cranial Nerves: II: Visual Fields are full. Pupils are equal, round, and reactive to light. III,IV, VI: EOMI without ptosis or diploplia.  V: Facial sensation is symmetric to light touch. Able to move jaw back and forth.  VII: Smile is symmetrical. Able to puff cheeks and raise eyebrows.  VIII: hearing is intact to voice IX, X: Uvula elevates symmetrically. Phonation normal.  XI: Shoulder shrug is symmetric. XII: tongue is midline without atrophy or fasciculations.  Motor: Tone is normal. Bulk is normal. 5/5 strength was present in all four extremities. Sensory: Sensation is symmetric to light touch in the arms and legs. Extinction intact.  Deep Tendon Reflexes: 2+ and symmetric in the biceps and patellae. Plantars: Toes are downgoing bilaterally. Cerebellar: FNF and HKS are intact bilaterally. No drift UE/LE.  Gait: deferred.   ASSESSMENT/PLAN Assessment: Gwendolyn Morrow is  a 80 y.o. female PMHx HTN and HLD who presented to [facility] ED with right sided numbness and right arm weakness with difficulty concentrating. She was evaluated by [facility] and was recommended for transfer to [facility].  Left PCA distribution infarct likely embolic with unknown source likely cryptogenic    CTA head  1. Severe stenosis within the proximal P2 segment of the left posterior cerebral artery. 2. Nonstenotic calcified plaque within the intracranial internal carotid arteries bilaterally.   CTA neck: 1. The common and internal carotid arteries are patent within the neck without hemodynamically significant stenosis. Mildatherosclerotic plaque within both carotid systems as described. 2. Nonspecific tortuosity and ectasia of the mid  to distal cervical left ICA, which measures up to 8 mm in diameter. 3. Vertebral arteries patent within the neck bilaterally. Nonstenotic calcified plaque at the origins of both vessels. 4. Aortic Atherosclerosis    MRI brain  1. Patchy small volume acute to early subacute left PCA distribution infarct as above. No associated hemorrhage or mass effect. 2. Otherwise normal brain MRI for age.  MRA not needed.  See CT angio  Carotid Doppler   2D Echo: EF 65%   Patient was started on Plavix. ASA was not started due to allergy to ASA on chart. However, patient states she has taken ASA in the past without issue and has no problem with restarting it.  LDL 90  HgbA1c 5.3  VTE prophylaxis is recommended    Diet   Diet Heart Room service appropriate? Yes; Fluid consistency: Thin  Will need Loop placement vs. 30 day heart monitor prior to discharge. Cardiology contacted.   Therapy recommendations:  No needs  Disposition:  Home   Follow up with [facility] in 4-6 weeks  Hypertension . Permissive hypertension (OK if < 220/120) but gradually normalize in 5-7 days . Long-term BP goal normotensive  Hyperlipidemia  Home meds:  home Atorvastatin was increased from 20mg  po qd to 40mg  po qd  LDL 90, goal < 70  Continue statin at discharge  Other Stroke Risk Factors  Advanced Age >/= 17   Coronary artery disease  Other Active Problems    Hospital day # 1 Patient presented with embolic left PCA infarct with strong suspicion for paroxysmal A. fib.  Stroke work-up is completed.  She likely needs a prolonged cardiac monitoring for paroxysmal A. fib upon discharge.  Recommend aspirin and Plavix for 3 weeks followed by Plavix alone due to questionable aspirin allergy.  Greater than 50% time during this 35-minute visit was spent in counseling and coordination of care about her stroke and answering questions.  Discussed with Dr. Rayann Heman. Antony Contras, MD To contact Stroke Continuity provider,  please refer to [email or web address] After hours, contact General Neurology

## 2020-10-06 NOTE — Evaluation (Signed)
Occupational Therapy Evaluation Patient Details Name: Gwendolyn Morrow MRN: [id number] DOB: 1941-03-16 Today's Date: 10/06/2020    History of Present Illness 80 y.o. female with a PMHx of HTN and HLD who presented to the [facility] ED on 10/04/20 with c/o right sided numbness from shoulder to foot with slight right arm weakness and difficulty concentrating.   Clinical Impression   PTA patient was living in a private residence and was independent with ADLs/IADLs without AD. Patient drives. Patient currently functioning at baseline reporting R-sided numbness/weakness has resolved. Strength grossly 5/5 in all 4 extremities and coordination, vision, and cognition intact. Patient demonstrates LB dressing to doff/don footwear, ADL transfers, and grooming standing at sink level with I. Patient does not require continued acute occupational therapy services at this time with OT to sign off.     Follow Up Recommendations  No OT follow up    Equipment Recommendations  None recommended by OT    Recommendations for Other Services       Precautions / Restrictions Precautions Precautions: Fall Restrictions Weight Bearing Restrictions: No      Mobility Bed Mobility Overal bed mobility: Modified Independent             General bed mobility comments: Return to supine with Mod I. No use of rails.    Transfers Overall transfer level: Independent Equipment used: None                  Balance Overall balance assessment: Mild deficits observed, not formally tested                                         ADL either performed or assessed with clinical judgement   ADL Overall ADL's : At baseline                                       General ADL Comments: Patient completed ADL transfers, functional mobility, LB dressing, and grooming tasks independently.     Vision   Vision Assessment?: No apparent visual deficits     Perception     Praxis       Pertinent Vitals/Pain Pain Assessment: No/denies pain     Hand Dominance Right   Extremity/Trunk Assessment Upper Extremity Assessment Upper Extremity Assessment: Overall WFL for tasks assessed   Lower Extremity Assessment Lower Extremity Assessment: Overall WFL for tasks assessed   Cervical / Trunk Assessment Cervical / Trunk Assessment: Normal   Communication Communication Communication: No difficulties   Cognition Arousal/Alertness: Awake/alert Behavior During Therapy: WFL for tasks assessed/performed Overall Cognitive Status: Within Functional Limits for tasks assessed                                     General Comments  VSS on RA    Exercises     Shoulder Instructions      Home Living Family/patient expects to be discharged to:: Private residence Living Arrangements: Alone Available Help at Discharge: Family;Neighbor;Friend(s);Available 24 hours/day Type of Home: House Home Access: Stairs to enter [employer] of Steps: 4 Entrance Stairs-Rails: Can reach both Home Layout: Two level;Able to live on main level with bedroom/bathroom     Bathroom Shower/Tub: Producer, television/film/video: Standard  Home Equipment: Walker - 2 wheels          Prior Functioning/Environment Level of Independence: Independent        Comments: likes tennis, hunting and fishing        OT Problem List:        OT Treatment/Interventions:      OT Goals(Current goals can be found in the care plan section) Acute Rehab OT Goals Patient Stated Goal: to go home OT Goal Formulation: With patient  OT Frequency:     Barriers to D/C:            Co-evaluation              AM-PAC OT "6 Clicks" Daily Activity     Outcome Measure Help from another person eating meals?: None Help from another person taking care of personal grooming?: None Help from another person toileting, which includes using toliet, bedpan, or urinal?: None Help  from another person bathing (including washing, rinsing, drying)?: None Help from another person to put on and taking off regular upper body clothing?: None Help from another person to put on and taking off regular lower body clothing?: None 6 Click Score: 24   End of Session Equipment Utilized During Treatment: Gait belt Nurse Communication: Mobility status  Activity Tolerance: Patient tolerated treatment well Patient left: in bed;with call bell/phone within reach;with bed alarm set  OT Visit Diagnosis: Muscle weakness (generalized) (M62.81)                Time: 4259-5638 OT Time Calculation (min): 15 min Charges:  OT General Charges $OT Visit: 1 Visit OT Evaluation $OT Eval Low Complexity: 1 Low  Darnice Comrie H. OTR/L Supplemental OT, Department of rehab services [phone]  Bernese Doffing R H. 10/06/2020, 10:17 AM

## 2020-10-06 NOTE — H&P (View-Only) (Signed)
ELECTROPHYSIOLOGY CONSULT NOTE  Patient ID: Gwendolyn Morrow MRN: [id number], DOB/AGE: 01/03/1941   Admit date: 10/04/2020 Date of Consult: 10/06/2020  Primary Physician: Lujean Amel, MD Primary Cardiologist: new to [facility] Reason for Consultation: Cryptogenic stroke; recommendations regarding risks of arrhythmia as well as arrhythmia detection options  History of Present Illness Gwendolyn Morrow was admitted on 10/04/2020 with acute CVA. she has been monitored on telemetry which has demonstrated no arrhythmias. No cause has been identified. Inpatient stroke work-up is complete.  EP has been asked to evaluate for placement of an implantable loop recorder to monitor for atrial fibrillation.  Past Medical History:  Diagnosis Date  . Arthritis   . Complication of anesthesia   . GERD (gastroesophageal reflux disease)   . H/O seasonal allergies   . Headache   . Hypertension   . PONV (postoperative nausea and vomiting)      Past Surgical History:  Procedure Laterality Date  . ABDOMINAL HYSTERECTOMY    . APPENDECTOMY     removed during C-section  . BREAST SURGERY     right breast biopsy- benign  . CESAREAN SECTION    . HAND SURGERY     bilateral thumb joints replaced  . TOTAL KNEE ARTHROPLASTY Right 09/24/2016   Procedure: TOTAL KNEE ARTHROPLASTY;  Surgeon: Paralee Cancel, MD;  Location: [facility];  Service: Orthopedics;  Laterality: Right;  . TUBAL LIGATION       Allergies  Allergen Reactions  . Penicillins Anaphylaxis    Has patient had a PCN reaction causing immediate rash, facial/tongue/throat swelling, SOB or lightheadedness with hypotension: Yes Has patient had a PCN reaction causing severe rash involving mucus membranes or skin necrosis: No Has patient had a PCN reaction that required hospitalization Yes Has patient had a PCN reaction occurring within the last 10 years: No If all of the above answers are "NO", then may proceed with Cephalosporin use.   . Enalapril Cough  .  Excedrin Extra Strength [Asa-Apap-Caff Buffered] Hives  . Excedrin Tension Headache [Acetaminophen-Caffeine] Hives  . Tyloxapol Hives    Inpatient Medications .  stroke: mapping our early stages of recovery book   Does not apply Once  . aspirin EC  81 mg Oral Daily  . atorvastatin  40 mg Oral q1800  . clopidogrel  75 mg Oral Daily  . pantoprazole  40 mg Oral Daily     Social History   Socioeconomic History  . Marital status: Married    Spouse name: Not on file  . Number of children: Not on file  . Years of education: Not on file  . Highest education level: Not on file  Occupational History  . Not on file  Tobacco Use  . Smoking status: Never Smoker  . Smokeless tobacco: Never Used  Substance and Sexual Activity  . Alcohol use: Yes    Comment: rarely  . Drug use: No  . Sexual activity: Not on file  Other Topics Concern  . Not on file  Social History Narrative  . Not on file   Social Determinants of Health   Financial Resource Strain: Not on file  Food Insecurity: Not on file  Transportation Needs: Not on file  Physical Activity: Not on file  Stress: Not on file  Social Connections: Not on file  Intimate Partner Violence: Not on file     Review of Systems All systems reviewed and are otherwise negative except as noted above.  History reviewed. No pertinent family history.  Physical Exam Blood  pressure (!) 153/77, pulse 89, temperature 98.4 F (36.9 C), temperature source Oral, resp. rate 18, height 5' 6.5" (1.689 m), weight 61.2 kg, SpO2 98 %.  General: Well developed, well appearing 80 y.o. female in no acute distress. HEENT: Normocephalic, atraumatic. EOMs intact. Sclera nonicteric. Oropharynx clear.  Neck: Supple without bruits. No JVD. Lungs: regular work of breathing Heart: RRR  Abdomen: Soft  Extremities: No clubbing, cyanosis or edema. Psych: Normal affect. Musculoskeletal: No kyphosis. Skin: Intact. Warm and dry. No rashes or petechiae in exposed  areas.   Labs Lab Results  Component Value Date   WBC 8.5 10/04/2020   HGB 15.6 (H) 10/04/2020   HCT 46.0 10/04/2020   MCV 89.4 10/04/2020   PLT 260 10/04/2020    Recent Labs  Lab 10/04/20 1321 10/04/20 1353  NA 141 139  K 3.6 3.6  CL 103 103  CO2 25  --   BUN 16 17  CREATININE 0.83 0.80  CALCIUM 10.0  --   PROT 8.0  --   BILITOT 0.8  --   ALKPHOS 95  --   ALT 20  --   AST 23  --   GLUCOSE 103* 100*   Recent Labs    10/04/20 1321  INR 0.9    Radiology/Studies CT ANGIO HEAD W OR WO CONTRAST  Addendum Date: 10/06/2020   ADDENDUM REPORT: 10/04/2020 20:41 ADDENDUM: These results were called by telephone at the time of interpretation on 10/04/2020 at 8:41 pm to provider Dr. Myna Hidalgo, who verbally acknowledged these results. Electronically Signed   By: Kellie Simmering DO   On: 10/04/2020 20:41   Result Date: 10/04/2020 CLINICAL DATA:  Neuro deficit, acute, stroke suspected. Additional history provided: Patient reports intermittent numbness and tingling in left arm, leg and mouth for 3 days. EXAM: CT ANGIOGRAPHY HEAD AND NECK TECHNIQUE: Multidetector CT imaging of the head and neck was performed using the standard protocol during bolus administration of intravenous contrast. Multiplanar CT image reconstructions and MIPs were obtained to evaluate the vascular anatomy. Carotid stenosis measurements (when applicable) are obtained utilizing NASCET criteria, using the distal internal carotid diameter as the denominator. CONTRAST:  37mL OMNIPAQUE IOHEXOL 350 MG/ML SOLN COMPARISON:  Brain MRI performed earlier the same day 10/04/2020. FINDINGS: CT HEAD FINDINGS Brain: Patchy small volume cortical and subcortical acute/early subacute infarcts within the left PCA territory involving the left temporal and occipital lobes, better appreciated on the brain MRI performed earlier today. Redemonstrated associated acute/early subacute infarct within the left thalamus. No evidence of hemorrhagic conversion.  No extra-axial fluid collection. No evidence of intracranial mass. No midline shift. Vascular: No hyperdense vessel.  Atherosclerotic calcifications. Skull: Normal. Negative for fracture or focal lesion. Sinuses: Mild bilateral ethmoid mucosal thickening. Orbits: No mass or acute finding. Review of the MIP images confirms the above findings CTA NECK FINDINGS Aortic arch: Common origin of the innominate and left common carotid arteries. Atherosclerotic plaque within the visualized aortic arch and proximal major branch vessels of the neck. No hemodynamically significant innominate or proximal subclavian artery stenosis. Right carotid system: CCA and ICA patent within the neck without significant stenosis (50% or greater). Mild soft and calcified plaque within the carotid bifurcation and proximal ICA. Left carotid system: CCA and ICA patent within the neck without significant stenosis (50% or greater). Mild soft plaque within the proximal to mid CCA. Mild soft and calcified plaque within the carotid bifurcation and proximal ICA. Tortuosity and ectasia of the mid to distal cervical ICA, which measures up  to 8 mm in diameter. Vertebral arteries: Patent within the neck bilaterally. The left vertebral artery is slightly dominant. Nonstenotic atherosclerotic plaque at the origin of both vertebral arteries. Skeleton: No acute bony abnormality or aggressive osseous lesion. Cervical spondylosis with multilevel disc space narrowing, disc bulges, uncovertebral hypertrophy and facet arthrosis. Disc space narrowing is moderate to moderately advanced at C6-C7. Fusion across the right C2-C3 facet joint. Grade 1 anterolisthesis at C3-C4, C4-C5, C5-C6 and C7-T1. Other neck: No neck mass or cervical lymphadenopathy. Upper chest: No consolidation within the imaged lung apices. Review of the MIP images confirms the above findings CTA HEAD FINDINGS Anterior circulation: The intracranial internal carotid arteries are patent. Nonstenotic  calcified plaque within both vessels. The M1 middle cerebral arteries are patent. No M2 proximal branch occlusion or high-grade proximal stenosis is identified. Markedly hypoplastic or developmentally absent A1 right ACA. The anterior cerebral arteries are otherwise patent. No intracranial aneurysm is identified. Posterior circulation: The non dominant right vertebral artery is patent and appears to terminate predominantly as the right PICA. The dominant intracranial left vertebral artery is patent. The basilar artery is patent. Predominantly fetal origin right posterior cerebral artery. A sizable left posterior communicating artery is also present. The posterior cerebral arteries are patent. Severe stenosis within the proximal P2 left PCA (series 17, image 21). Venous sinuses: Within the limitations of contrast timing, no convincing thrombus. Anatomic variants: As described Review of the MIP images confirms the above findings IMPRESSION: CT head: Patchy small-volume acute/early subacute infarcts within the left temporal occipital lobes as well as left thalamus (left PCA vascular territory), some of which were better appreciated on the MRI performed earlier today. No evidence of hemorrhagic conversion. CTA neck: 1. The common and internal carotid arteries are patent within the neck without hemodynamically significant stenosis. Mild atherosclerotic plaque within both carotid systems as described. 2. Nonspecific tortuosity and ectasia of the mid to distal cervical left ICA, which measures up to 8 mm in diameter. 3. Vertebral arteries patent within the neck bilaterally. Nonstenotic calcified plaque at the origins of both vessels. 4.  Aortic Atherosclerosis (ICD10-I70.0). CTA head: 1. Severe stenosis within the proximal P2 segment of the left posterior cerebral artery. 2. Nonstenotic calcified plaque within the intracranial internal carotid arteries bilaterally. Electronically Signed: By: Kellie Simmering DO On: 10/04/2020  20:21   CT ANGIO NECK W OR WO CONTRAST  Addendum Date: 10/06/2020   ADDENDUM REPORT: 10/04/2020 20:41 ADDENDUM: These results were called by telephone at the time of interpretation on 10/04/2020 at 8:41 pm to provider Dr. Myna Hidalgo, who verbally acknowledged these results. Electronically Signed   By: Kellie Simmering DO   On: 10/04/2020 20:41   Result Date: 10/04/2020 CLINICAL DATA:  Neuro deficit, acute, stroke suspected. Additional history provided: Patient reports intermittent numbness and tingling in left arm, leg and mouth for 3 days. EXAM: CT ANGIOGRAPHY HEAD AND NECK TECHNIQUE: Multidetector CT imaging of the head and neck was performed using the standard protocol during bolus administration of intravenous contrast. Multiplanar CT image reconstructions and MIPs were obtained to evaluate the vascular anatomy. Carotid stenosis measurements (when applicable) are obtained utilizing NASCET criteria, using the distal internal carotid diameter as the denominator. CONTRAST:  42mL OMNIPAQUE IOHEXOL 350 MG/ML SOLN COMPARISON:  Brain MRI performed earlier the same day 10/04/2020. FINDINGS: CT HEAD FINDINGS Brain: Patchy small volume cortical and subcortical acute/early subacute infarcts within the left PCA territory involving the left temporal and occipital lobes, better appreciated on the brain MRI performed earlier  today. Redemonstrated associated acute/early subacute infarct within the left thalamus. No evidence of hemorrhagic conversion. No extra-axial fluid collection. No evidence of intracranial mass. No midline shift. Vascular: No hyperdense vessel.  Atherosclerotic calcifications. Skull: Normal. Negative for fracture or focal lesion. Sinuses: Mild bilateral ethmoid mucosal thickening. Orbits: No mass or acute finding. Review of the MIP images confirms the above findings CTA NECK FINDINGS Aortic arch: Common origin of the innominate and left common carotid arteries. Atherosclerotic plaque within the visualized aortic  arch and proximal major branch vessels of the neck. No hemodynamically significant innominate or proximal subclavian artery stenosis. Right carotid system: CCA and ICA patent within the neck without significant stenosis (50% or greater). Mild soft and calcified plaque within the carotid bifurcation and proximal ICA. Left carotid system: CCA and ICA patent within the neck without significant stenosis (50% or greater). Mild soft plaque within the proximal to mid CCA. Mild soft and calcified plaque within the carotid bifurcation and proximal ICA. Tortuosity and ectasia of the mid to distal cervical ICA, which measures up to 8 mm in diameter. Vertebral arteries: Patent within the neck bilaterally. The left vertebral artery is slightly dominant. Nonstenotic atherosclerotic plaque at the origin of both vertebral arteries. Skeleton: No acute bony abnormality or aggressive osseous lesion. Cervical spondylosis with multilevel disc space narrowing, disc bulges, uncovertebral hypertrophy and facet arthrosis. Disc space narrowing is moderate to moderately advanced at C6-C7. Fusion across the right C2-C3 facet joint. Grade 1 anterolisthesis at C3-C4, C4-C5, C5-C6 and C7-T1. Other neck: No neck mass or cervical lymphadenopathy. Upper chest: No consolidation within the imaged lung apices. Review of the MIP images confirms the above findings CTA HEAD FINDINGS Anterior circulation: The intracranial internal carotid arteries are patent. Nonstenotic calcified plaque within both vessels. The M1 middle cerebral arteries are patent. No M2 proximal branch occlusion or high-grade proximal stenosis is identified. Markedly hypoplastic or developmentally absent A1 right ACA. The anterior cerebral arteries are otherwise patent. No intracranial aneurysm is identified. Posterior circulation: The non dominant right vertebral artery is patent and appears to terminate predominantly as the right PICA. The dominant intracranial left vertebral artery  is patent. The basilar artery is patent. Predominantly fetal origin right posterior cerebral artery. A sizable left posterior communicating artery is also present. The posterior cerebral arteries are patent. Severe stenosis within the proximal P2 left PCA (series 17, image 21). Venous sinuses: Within the limitations of contrast timing, no convincing thrombus. Anatomic variants: As described Review of the MIP images confirms the above findings IMPRESSION: CT head: Patchy small-volume acute/early subacute infarcts within the left temporal occipital lobes as well as left thalamus (left PCA vascular territory), some of which were better appreciated on the MRI performed earlier today. No evidence of hemorrhagic conversion. CTA neck: 1. The common and internal carotid arteries are patent within the neck without hemodynamically significant stenosis. Mild atherosclerotic plaque within both carotid systems as described. 2. Nonspecific tortuosity and ectasia of the mid to distal cervical left ICA, which measures up to 8 mm in diameter. 3. Vertebral arteries patent within the neck bilaterally. Nonstenotic calcified plaque at the origins of both vessels. 4.  Aortic Atherosclerosis (ICD10-I70.0). CTA head: 1. Severe stenosis within the proximal P2 segment of the left posterior cerebral artery. 2. Nonstenotic calcified plaque within the intracranial internal carotid arteries bilaterally. Electronically Signed: By: Kellie Simmering DO On: 10/04/2020 20:21   MR BRAIN WO CONTRAST  Result Date: 10/04/2020 CLINICAL DATA:  Initial evaluation for acute TIA, right-sided numbness and  tingling. Weakness. EXAM: MRI HEAD WITHOUT CONTRAST TECHNIQUE: Multiplanar, multiecho pulse sequences of the brain and surrounding structures were obtained without intravenous contrast. COMPARISON:  Previous MRI from 01/21/2013. FINDINGS: Brain: Cerebral volume within normal limits for age. No significant cerebral white matter disease. Patchy multifocal areas  of small volume restricted diffusion seen involving the parasagittal left temporal occipital region, consistent with an acute to early subacute left PCA distribution infarct (series 5, image 76). Subtle patchy involvement of the left thalamus noted. No associated hemorrhage or mass effect. No other diffusion abnormality to suggest acute or subacute ischemia elsewhere within the brain. Gray-white matter differentiation otherwise maintained. No encephalomalacia to suggest chronic cortical infarction elsewhere. No foci of susceptibility artifact to suggest acute or chronic intracranial hemorrhage. No mass lesion, midline shift or mass effect. No hydrocephalus or extra-axial fluid collection. Pituitary gland suprasellar region normal. Midline structures intact. Vascular: Major intracranial vascular flow voids are maintained. Increased FLAIR signal intensity without associated T1 correlate within the left transverse and sigmoid sinus is likely related to slow/sluggish flow. No frank imaging findings to suggest dural venous thrombosis. Skull and upper cervical spine: Craniocervical junction within normal limits. Bone marrow signal intensity normal. No scalp soft tissue abnormality. Sinuses/Orbits: Globes and orbital soft tissues within normal limits. Paranasal sinuses are clear. No mastoid effusion. Inner ear structures normal. Other: None. IMPRESSION: 1. Patchy small volume acute to early subacute left PCA distribution infarct as above. No associated hemorrhage or mass effect. 2. Otherwise normal brain MRI for age. Electronically Signed   By: Jeannine Boga M.D.   On: 10/04/2020 19:07   ECHOCARDIOGRAM COMPLETE  Result Date: 10/05/2020    ECHOCARDIOGRAM REPORT   Patient Name:   Gwendolyn Morrow Date of Exam: 10/05/2020 Medical Rec #:  [id number]   Height:       66.5 in Accession #:    [id number]  Weight:       135.0 lb Date of Birth:  1941/08/19   BSA:          1.702 m Patient Age:    88 years    BP:           154/78  mmHg Patient Gender: F           HR:           87 bpm. Exam Location:  Inpatient Procedure: 2D Echo, Color Doppler and Cardiac Doppler Indications:    Stroke 434.91 / I163.9  History:        Patient has no prior history of Echocardiogram examinations.                 Risk Factors:Hypertension.  Sonographer:    Bernadene Person RDCS Referring Phys: [id number] [place]  1. Left ventricular ejection fraction, by estimation, is 65 to 70%. The left ventricle has normal function. The left ventricle has no regional wall motion abnormalities. Left ventricular diastolic parameters are consistent with Grade I diastolic dysfunction (impaired relaxation).  2. Right ventricular systolic function is normal. The right ventricular size is normal. There is normal pulmonary artery systolic pressure.  3. The mitral valve is normal in structure. Trivial mitral valve regurgitation. No evidence of mitral stenosis.  4. The aortic valve is tricuspid. Aortic valve regurgitation is not visualized. No aortic stenosis is present.  5. The inferior vena cava is normal in size with greater than 50% respiratory variability, suggesting right atrial pressure of 3 mmHg. FINDINGS  Left Ventricle: Left ventricular ejection fraction, by estimation,  is 65 to 70%. The left ventricle has normal function. The left ventricle has no regional wall motion abnormalities. The left ventricular internal cavity size was normal in size. There is  no left ventricular hypertrophy. Left ventricular diastolic parameters are consistent with Grade I diastolic dysfunction (impaired relaxation). Normal left ventricular filling pressure. Right Ventricle: The right ventricular size is normal. No increase in right ventricular wall thickness. Right ventricular systolic function is normal. There is normal pulmonary artery systolic pressure. The tricuspid regurgitant velocity is 1.33 m/s, and  with an assumed right atrial pressure of 3 mmHg, the estimated right  ventricular systolic pressure is [id number] mmHg. Left Atrium: Left atrial size was normal in size. Right Atrium: Right atrial size was normal in size. Pericardium: There is no evidence of pericardial effusion. Mitral Valve: The mitral valve is normal in structure. Trivial mitral valve regurgitation. No evidence of mitral valve stenosis. Tricuspid Valve: The tricuspid valve is normal in structure. Tricuspid valve regurgitation is trivial. No evidence of tricuspid stenosis. Aortic Valve: The aortic valve is tricuspid. Aortic valve regurgitation is not visualized. No aortic stenosis is present. Pulmonic Valve: The pulmonic valve was normal in structure. Pulmonic valve regurgitation is not visualized. No evidence of pulmonic stenosis. Aorta: The aortic root is normal in size and structure. Venous: The inferior vena cava is normal in size with greater than 50% respiratory variability, suggesting right atrial pressure of 3 mmHg. IAS/Shunts: No atrial level shunt detected by color flow Doppler.  LEFT VENTRICLE PLAX 2D LVIDd:         4.50 cm  Diastology LVIDs:         3.30 cm  LV e' medial:    7.94 cm/s LV PW:         0.80 cm  LV E/e' medial:  7.3 LV IVS:        1.00 cm  LV e' lateral:   10.40 cm/s LVOT diam:     1.90 cm  LV E/e' lateral: 5.6 LV SV:         77 LV SV Index:   45 LVOT Area:     2.84 cm  RIGHT VENTRICLE RV S prime:     20.40 cm/s TAPSE (M-mode): 2.3 cm LEFT ATRIUM             Index       RIGHT ATRIUM           Index LA diam:        3.00 cm 1.76 cm/m  RA Area:     14.10 cm LA Vol (A2C):   40.4 ml 23.74 ml/m RA Volume:   32.00 ml  18.81 ml/m LA Vol (A4C):   40.6 ml 23.86 ml/m LA Biplane Vol: 44.6 ml 26.21 ml/m  AORTIC VALVE LVOT Vmax:   122.00 cm/s LVOT Vmean:  94.300 cm/s LVOT VTI:    0.272 m  AORTA Ao Root diam: 3.30 cm MITRAL VALVE                TRICUSPID VALVE MV Area (PHT): 2.29 cm     TR Peak grad:   7.1 mmHg MV Decel Time: 331 msec     TR Vmax:        133.00 cm/s MV E velocity: 58.00 cm/s MV A  velocity: 108.00 cm/s  SHUNTS MV E/A ratio:  0.54         Systemic VTI:  0.27 m  Systemic Diam: 1.90 cm Skeet Latch MD Electronically signed by Skeet Latch MD Signature Date/Time: 10/05/2020/9:38:46 AM    Final     Echocardiogram  EF 65-70%, LA 30  12-lead ECG sinus rhythm, rate 88 Telemetry sinus rhythm, no afib detected   Assessment and Plan 1. Cryptogenic stroke The patient presents with stroke of unknown source.  Workup has been unrevealing thus far.  There is concern for cardioembolic source.  I recommend loop recorder insertion to monitor for AF. The indication for loop recorder insertion / monitoring for AF in setting of cryptogenic stroke was discussed with the patient. The loop recorder insertion procedure was reviewed in detail including risks and benefits. These risks include but are not limited to bleeding and infection. The patient expressed verbal understanding and agrees to proceed. The patient was also counseled regarding wound care and device follow-up.  Thompson Grayer MD, [place] 10/06/2020 4:28 PM

## 2020-10-06 NOTE — Evaluation (Addendum)
Clinical/Bedside Swallow Evaluation Patient Details  Name: Gwendolyn Morrow MRN: [id number] Date of Birth: 1941/09/12  Today's Date: 10/06/2020 Time: SLP Start Time (ACUTE ONLY): 1010 SLP Stop Time (ACUTE ONLY): 1019 SLP Time Calculation (min) (ACUTE ONLY): 9 min  Past Medical History:  Past Medical History:  Diagnosis Date  . Arthritis   . Complication of anesthesia   . GERD (gastroesophageal reflux disease)   . H/O seasonal allergies   . Headache   . Hypertension   . PONV (postoperative nausea and vomiting)    Past Surgical History:  Past Surgical History:  Procedure Laterality Date  . ABDOMINAL HYSTERECTOMY    . APPENDECTOMY     removed during C-section  . BREAST SURGERY     right breast biopsy- benign  . CESAREAN SECTION    . HAND SURGERY     bilateral thumb joints replaced  . TOTAL KNEE ARTHROPLASTY Right 09/24/2016   Procedure: TOTAL KNEE ARTHROPLASTY;  Surgeon: Paralee Cancel, MD;  Location: [facility];  Service: Orthopedics;  Laterality: Right;  . TUBAL LIGATION     HPI:  80 y.o. female with a PMHx of HTN and HLD who presented to the [facility] ED on 10/04/20 with c/o right sided numbness from shoulder to foot with slight right arm weakness and difficulty concentrating. MRI of head revealed Patchy small volume acute to early subacute left PCA distribution infarct as above.   Assessment / Plan / Recommendation Clinical Impression  Pts swallow appears within functional limits. Oral motor exam unremarkable. 3 oz water challenge without difficulty. No overt s/sx of aspiration with any PO. Continue regular thin liquid diet.  SLP Visit Diagnosis: Dysphagia, unspecified (R13.10)    Aspiration Risk  No limitations    Diet Recommendation   Regular thin liquids  Medication Administration: Whole meds with liquid    Other  Recommendations Oral Care Recommendations: Oral care BID   Follow up Recommendations None        Swallow Study   General Date of Onset: 10/04/20 HPI: 80 y.o.  female with a PMHx of HTN and HLD who presented to the [facility] ED on 10/04/20 with c/o right sided numbness from shoulder to foot with slight right arm weakness and difficulty concentrating. MRI of head revealed Patchy small volume acute to early subacute left PCA distribution infarct as above. Type of Study: Bedside Swallow Evaluation Previous Swallow Assessment: none on file Diet Prior to this Study: Regular;Thin liquids Temperature Spikes Noted: No Respiratory Status: Room air History of Recent Intubation: No Behavior/Cognition: Alert;Cooperative;Pleasant mood Oral Cavity Assessment: Within Functional Limits Oral Care Completed by SLP: No Oral Cavity - Dentition: Adequate natural dentition Vision: Functional for self-feeding Self-Feeding Abilities: Able to feed self Patient Positioning: Upright in bed Baseline Vocal Quality: Normal Volitional Swallow: Able to elicit    Oral/Motor/Sensory Function Overall Oral Motor/Sensory Function: Within functional limits   Ice Chips Ice chips: Not tested   Thin Liquid Thin Liquid: Within functional limits Presentation: Cup;Straw    Nectar Thick Nectar Thick Liquid: Not tested   Honey Thick Honey Thick Liquid: Not tested   Puree Puree: Within functional limits   Solid     Solid: Within functional limits      Hridhaan Yohn H. MA, CCC-SLP Acute Rehabilitation Services  10/06/2020,10:44 AM

## 2020-10-06 NOTE — Evaluation (Signed)
Speech Language Pathology Evaluation Patient Details Name: Gwendolyn Morrow MRN: [id number] DOB: 1941/02/16 Today's Date: 10/06/2020 Time: 1019-1040 SLP Time Calculation (min) (ACUTE ONLY): 21 min  Problem List:  Patient Active Problem List   Diagnosis Date Noted  . Acute CVA (cerebrovascular accident) ([place]) 10/05/2020  . Acute ischemic stroke ([place]) 10/04/2020  . Hypertension   . S/P right TKA 09/24/2016  . S/P knee replacement 09/24/2016   Past Medical History:  Past Medical History:  Diagnosis Date  . Arthritis   . Complication of anesthesia   . GERD (gastroesophageal reflux disease)   . H/O seasonal allergies   . Headache   . Hypertension   . PONV (postoperative nausea and vomiting)    Past Surgical History:  Past Surgical History:  Procedure Laterality Date  . ABDOMINAL HYSTERECTOMY    . APPENDECTOMY     removed during C-section  . BREAST SURGERY     right breast biopsy- benign  . CESAREAN SECTION    . HAND SURGERY     bilateral thumb joints replaced  . TOTAL KNEE ARTHROPLASTY Right 09/24/2016   Procedure: TOTAL KNEE ARTHROPLASTY;  Surgeon: Paralee Cancel, MD;  Location: [facility];  Service: Orthopedics;  Laterality: Right;  . TUBAL LIGATION     HPI:  80 y.o. female with a PMHx of HTN and HLD who presented to the [facility] ED on 10/04/20 with c/o right sided numbness from shoulder to foot with slight right arm weakness and difficulty concentrating. MRI of head revealed Patchy small volume acute to early subacute left PCA distribution infarct as above.   Assessment / Plan / Recommendation Clinical Impression  Cognitive linguistic skills appear at baseline per clinical interactions and pt report. Pt states current difficulty is episodic word finding troubles. She notes this was occuring prior to CVA. No anomic aphasia exhibited during interaction with SLP this date. Educated upon word finding strategies.  Administered SLUMS: 23/30. Defiicts noted in recall, primarily delayed recall of  novel information. Pt states she utilizes calendar, reminders on phone, that no interuptions are noted with ADLs. Pt lives alone, states friends and family are available if assistance is ever needed. Receptive language and motor speech skills appeared intact. Oral motor examination was unremarkable.  No further ST needs identified.    SLP Assessment  SLP Recommendation/Assessment: Patient does not need any further Speech Lanaguage Pathology Services SLP Visit Diagnosis: Aphasia (R47.01);Cognitive communication deficit (R41.841)    Follow Up Recommendations  None    Frequency and Duration           SLP Evaluation Cognition  Overall Cognitive Status: History of cognitive impairments - at baseline Arousal/Alertness: Awake/alert Orientation Level: Oriented X4 Attention: Focused;Sustained Focused Attention: Appears intact Sustained Attention: Appears intact Memory: Impaired Memory Impairment: Decreased short term memory;Decreased recall of new information Awareness: Appears intact Problem Solving: Appears intact Executive Function: Organizing;Self Monitoring Organizing: Impaired Organizing Impairment: Functional complex Self Monitoring: Impaired Self Monitoring Impairment: Verbal complex Safety/Judgment: Appears intact       Comprehension  Auditory Comprehension Overall Auditory Comprehension: Appears within functional limits for tasks assessed Visual Recognition/Discrimination Discrimination: Within Function Limits Reading Comprehension Reading Status: Within funtional limits    Expression Expression Primary Mode of Expression: Verbal Verbal Expression Overall Verbal Expression: Impaired at baseline Initiation: No impairment Level of Generative/Spontaneous Verbalization: Conversation Repetition: No impairment Naming: Impairment Pragmatics: No impairment Other Verbal Expression Comments: minimal anomic aphasia reported Written Expression Dominant Hand: Right Written  Expression: Within Functional Limits   Oral / Motor  Oral Motor/Sensory Function Overall Oral Motor/Sensory Function: Within functional limits Motor Speech Overall Motor Speech: Appears within functional limits for tasks assessed   GO                    Hayden Rasmussen MA, CCC-SLP Acute Rehabilitation Services 10/06/2020, 10:55 AM

## 2020-10-06 NOTE — Consult Note (Signed)
ELECTROPHYSIOLOGY CONSULT NOTE  Patient ID: Gwendolyn Morrow MRN: [id number], DOB/AGE: 01/03/1941   Admit date: 10/04/2020 Date of Consult: 10/06/2020  Primary Physician: Lujean Amel, MD Primary Cardiologist: new to [facility] Reason for Consultation: Cryptogenic stroke; recommendations regarding risks of arrhythmia as well as arrhythmia detection options  History of Present Illness Gwendolyn Morrow was admitted on 10/04/2020 with acute CVA. she has been monitored on telemetry which has demonstrated no arrhythmias. No cause has been identified. Inpatient stroke work-up is complete.  EP has been asked to evaluate for placement of an implantable loop recorder to monitor for atrial fibrillation.  Past Medical History:  Diagnosis Date  . Arthritis   . Complication of anesthesia   . GERD (gastroesophageal reflux disease)   . H/O seasonal allergies   . Headache   . Hypertension   . PONV (postoperative nausea and vomiting)      Past Surgical History:  Procedure Laterality Date  . ABDOMINAL HYSTERECTOMY    . APPENDECTOMY     removed during C-section  . BREAST SURGERY     right breast biopsy- benign  . CESAREAN SECTION    . HAND SURGERY     bilateral thumb joints replaced  . TOTAL KNEE ARTHROPLASTY Right 09/24/2016   Procedure: TOTAL KNEE ARTHROPLASTY;  Surgeon: Paralee Cancel, MD;  Location: [facility];  Service: Orthopedics;  Laterality: Right;  . TUBAL LIGATION       Allergies  Allergen Reactions  . Penicillins Anaphylaxis    Has patient had a PCN reaction causing immediate rash, facial/tongue/throat swelling, SOB or lightheadedness with hypotension: Yes Has patient had a PCN reaction causing severe rash involving mucus membranes or skin necrosis: No Has patient had a PCN reaction that required hospitalization Yes Has patient had a PCN reaction occurring within the last 10 years: No If all of the above answers are "NO", then may proceed with Cephalosporin use.   . Enalapril Cough  .  Excedrin Extra Strength [Asa-Apap-Caff Buffered] Hives  . Excedrin Tension Headache [Acetaminophen-Caffeine] Hives  . Tyloxapol Hives    Inpatient Medications .  stroke: mapping our early stages of recovery book   Does not apply Once  . aspirin EC  81 mg Oral Daily  . atorvastatin  40 mg Oral q1800  . clopidogrel  75 mg Oral Daily  . pantoprazole  40 mg Oral Daily     Social History   Socioeconomic History  . Marital status: Married    Spouse name: Not on file  . Number of children: Not on file  . Years of education: Not on file  . Highest education level: Not on file  Occupational History  . Not on file  Tobacco Use  . Smoking status: Never Smoker  . Smokeless tobacco: Never Used  Substance and Sexual Activity  . Alcohol use: Yes    Comment: rarely  . Drug use: No  . Sexual activity: Not on file  Other Topics Concern  . Not on file  Social History Narrative  . Not on file   Social Determinants of Health   Financial Resource Strain: Not on file  Food Insecurity: Not on file  Transportation Needs: Not on file  Physical Activity: Not on file  Stress: Not on file  Social Connections: Not on file  Intimate Partner Violence: Not on file     Review of Systems All systems reviewed and are otherwise negative except as noted above.  History reviewed. No pertinent family history.  Physical Exam Blood  pressure (!) 153/77, pulse 89, temperature 98.4 F (36.9 C), temperature source Oral, resp. rate 18, height 5' 6.5" (1.689 m), weight 61.2 kg, SpO2 98 %.  General: Well developed, well appearing 80 y.o. female in no acute distress. HEENT: Normocephalic, atraumatic. EOMs intact. Sclera nonicteric. Oropharynx clear.  Neck: Supple without bruits. No JVD. Lungs: regular work of breathing Heart: RRR  Abdomen: Soft  Extremities: No clubbing, cyanosis or edema. Psych: Normal affect. Musculoskeletal: No kyphosis. Skin: Intact. Warm and dry. No rashes or petechiae in exposed  areas.   Labs Lab Results  Component Value Date   WBC 8.5 10/04/2020   HGB 15.6 (H) 10/04/2020   HCT 46.0 10/04/2020   MCV 89.4 10/04/2020   PLT 260 10/04/2020    Recent Labs  Lab 10/04/20 1321 10/04/20 1353  NA 141 139  K 3.6 3.6  CL 103 103  CO2 25  --   BUN 16 17  CREATININE 0.83 0.80  CALCIUM 10.0  --   PROT 8.0  --   BILITOT 0.8  --   ALKPHOS 95  --   ALT 20  --   AST 23  --   GLUCOSE 103* 100*   Recent Labs    10/04/20 1321  INR 0.9    Radiology/Studies CT ANGIO HEAD W OR WO CONTRAST  Addendum Date: 10/06/2020   ADDENDUM REPORT: 10/04/2020 20:41 ADDENDUM: These results were called by telephone at the time of interpretation on 10/04/2020 at 8:41 pm to provider Dr. Myna Hidalgo, who verbally acknowledged these results. Electronically Signed   By: Kellie Simmering DO   On: 10/04/2020 20:41   Result Date: 10/04/2020 CLINICAL DATA:  Neuro deficit, acute, stroke suspected. Additional history provided: Patient reports intermittent numbness and tingling in left arm, leg and mouth for 3 days. EXAM: CT ANGIOGRAPHY HEAD AND NECK TECHNIQUE: Multidetector CT imaging of the head and neck was performed using the standard protocol during bolus administration of intravenous contrast. Multiplanar CT image reconstructions and MIPs were obtained to evaluate the vascular anatomy. Carotid stenosis measurements (when applicable) are obtained utilizing NASCET criteria, using the distal internal carotid diameter as the denominator. CONTRAST:  75mL OMNIPAQUE IOHEXOL 350 MG/ML SOLN COMPARISON:  Brain MRI performed earlier the same day 10/04/2020. FINDINGS: CT HEAD FINDINGS Brain: Patchy small volume cortical and subcortical acute/early subacute infarcts within the left PCA territory involving the left temporal and occipital lobes, better appreciated on the brain MRI performed earlier today. Redemonstrated associated acute/early subacute infarct within the left thalamus. No evidence of hemorrhagic conversion.  No extra-axial fluid collection. No evidence of intracranial mass. No midline shift. Vascular: No hyperdense vessel.  Atherosclerotic calcifications. Skull: Normal. Negative for fracture or focal lesion. Sinuses: Mild bilateral ethmoid mucosal thickening. Orbits: No mass or acute finding. Review of the MIP images confirms the above findings CTA NECK FINDINGS Aortic arch: Common origin of the innominate and left common carotid arteries. Atherosclerotic plaque within the visualized aortic arch and proximal major branch vessels of the neck. No hemodynamically significant innominate or proximal subclavian artery stenosis. Right carotid system: CCA and ICA patent within the neck without significant stenosis (50% or greater). Mild soft and calcified plaque within the carotid bifurcation and proximal ICA. Left carotid system: CCA and ICA patent within the neck without significant stenosis (50% or greater). Mild soft plaque within the proximal to mid CCA. Mild soft and calcified plaque within the carotid bifurcation and proximal ICA. Tortuosity and ectasia of the mid to distal cervical ICA, which measures up  to 8 mm in diameter. Vertebral arteries: Patent within the neck bilaterally. The left vertebral artery is slightly dominant. Nonstenotic atherosclerotic plaque at the origin of both vertebral arteries. Skeleton: No acute bony abnormality or aggressive osseous lesion. Cervical spondylosis with multilevel disc space narrowing, disc bulges, uncovertebral hypertrophy and facet arthrosis. Disc space narrowing is moderate to moderately advanced at C6-C7. Fusion across the right C2-C3 facet joint. Grade 1 anterolisthesis at C3-C4, C4-C5, C5-C6 and C7-T1. Other neck: No neck mass or cervical lymphadenopathy. Upper chest: No consolidation within the imaged lung apices. Review of the MIP images confirms the above findings CTA HEAD FINDINGS Anterior circulation: The intracranial internal carotid arteries are patent. Nonstenotic  calcified plaque within both vessels. The M1 middle cerebral arteries are patent. No M2 proximal branch occlusion or high-grade proximal stenosis is identified. Markedly hypoplastic or developmentally absent A1 right ACA. The anterior cerebral arteries are otherwise patent. No intracranial aneurysm is identified. Posterior circulation: The non dominant right vertebral artery is patent and appears to terminate predominantly as the right PICA. The dominant intracranial left vertebral artery is patent. The basilar artery is patent. Predominantly fetal origin right posterior cerebral artery. A sizable left posterior communicating artery is also present. The posterior cerebral arteries are patent. Severe stenosis within the proximal P2 left PCA (series 17, image 21). Venous sinuses: Within the limitations of contrast timing, no convincing thrombus. Anatomic variants: As described Review of the MIP images confirms the above findings IMPRESSION: CT head: Patchy small-volume acute/early subacute infarcts within the left temporal occipital lobes as well as left thalamus (left PCA vascular territory), some of which were better appreciated on the MRI performed earlier today. No evidence of hemorrhagic conversion. CTA neck: 1. The common and internal carotid arteries are patent within the neck without hemodynamically significant stenosis. Mild atherosclerotic plaque within both carotid systems as described. 2. Nonspecific tortuosity and ectasia of the mid to distal cervical left ICA, which measures up to 8 mm in diameter. 3. Vertebral arteries patent within the neck bilaterally. Nonstenotic calcified plaque at the origins of both vessels. 4.  Aortic Atherosclerosis (ICD10-I70.0). CTA head: 1. Severe stenosis within the proximal P2 segment of the left posterior cerebral artery. 2. Nonstenotic calcified plaque within the intracranial internal carotid arteries bilaterally. Electronically Signed: By: Kellie Simmering DO On: 10/04/2020  20:21   CT ANGIO NECK W OR WO CONTRAST  Addendum Date: 10/06/2020   ADDENDUM REPORT: 10/04/2020 20:41 ADDENDUM: These results were called by telephone at the time of interpretation on 10/04/2020 at 8:41 pm to provider Dr. Myna Hidalgo, who verbally acknowledged these results. Electronically Signed   By: Kellie Simmering DO   On: 10/04/2020 20:41   Result Date: 10/04/2020 CLINICAL DATA:  Neuro deficit, acute, stroke suspected. Additional history provided: Patient reports intermittent numbness and tingling in left arm, leg and mouth for 3 days. EXAM: CT ANGIOGRAPHY HEAD AND NECK TECHNIQUE: Multidetector CT imaging of the head and neck was performed using the standard protocol during bolus administration of intravenous contrast. Multiplanar CT image reconstructions and MIPs were obtained to evaluate the vascular anatomy. Carotid stenosis measurements (when applicable) are obtained utilizing NASCET criteria, using the distal internal carotid diameter as the denominator. CONTRAST:  76mL OMNIPAQUE IOHEXOL 350 MG/ML SOLN COMPARISON:  Brain MRI performed earlier the same day 10/04/2020. FINDINGS: CT HEAD FINDINGS Brain: Patchy small volume cortical and subcortical acute/early subacute infarcts within the left PCA territory involving the left temporal and occipital lobes, better appreciated on the brain MRI performed earlier  today. Redemonstrated associated acute/early subacute infarct within the left thalamus. No evidence of hemorrhagic conversion. No extra-axial fluid collection. No evidence of intracranial mass. No midline shift. Vascular: No hyperdense vessel.  Atherosclerotic calcifications. Skull: Normal. Negative for fracture or focal lesion. Sinuses: Mild bilateral ethmoid mucosal thickening. Orbits: No mass or acute finding. Review of the MIP images confirms the above findings CTA NECK FINDINGS Aortic arch: Common origin of the innominate and left common carotid arteries. Atherosclerotic plaque within the visualized aortic  arch and proximal major branch vessels of the neck. No hemodynamically significant innominate or proximal subclavian artery stenosis. Right carotid system: CCA and ICA patent within the neck without significant stenosis (50% or greater). Mild soft and calcified plaque within the carotid bifurcation and proximal ICA. Left carotid system: CCA and ICA patent within the neck without significant stenosis (50% or greater). Mild soft plaque within the proximal to mid CCA. Mild soft and calcified plaque within the carotid bifurcation and proximal ICA. Tortuosity and ectasia of the mid to distal cervical ICA, which measures up to 8 mm in diameter. Vertebral arteries: Patent within the neck bilaterally. The left vertebral artery is slightly dominant. Nonstenotic atherosclerotic plaque at the origin of both vertebral arteries. Skeleton: No acute bony abnormality or aggressive osseous lesion. Cervical spondylosis with multilevel disc space narrowing, disc bulges, uncovertebral hypertrophy and facet arthrosis. Disc space narrowing is moderate to moderately advanced at C6-C7. Fusion across the right C2-C3 facet joint. Grade 1 anterolisthesis at C3-C4, C4-C5, C5-C6 and C7-T1. Other neck: No neck mass or cervical lymphadenopathy. Upper chest: No consolidation within the imaged lung apices. Review of the MIP images confirms the above findings CTA HEAD FINDINGS Anterior circulation: The intracranial internal carotid arteries are patent. Nonstenotic calcified plaque within both vessels. The M1 middle cerebral arteries are patent. No M2 proximal branch occlusion or high-grade proximal stenosis is identified. Markedly hypoplastic or developmentally absent A1 right ACA. The anterior cerebral arteries are otherwise patent. No intracranial aneurysm is identified. Posterior circulation: The non dominant right vertebral artery is patent and appears to terminate predominantly as the right PICA. The dominant intracranial left vertebral artery  is patent. The basilar artery is patent. Predominantly fetal origin right posterior cerebral artery. A sizable left posterior communicating artery is also present. The posterior cerebral arteries are patent. Severe stenosis within the proximal P2 left PCA (series 17, image 21). Venous sinuses: Within the limitations of contrast timing, no convincing thrombus. Anatomic variants: As described Review of the MIP images confirms the above findings IMPRESSION: CT head: Patchy small-volume acute/early subacute infarcts within the left temporal occipital lobes as well as left thalamus (left PCA vascular territory), some of which were better appreciated on the MRI performed earlier today. No evidence of hemorrhagic conversion. CTA neck: 1. The common and internal carotid arteries are patent within the neck without hemodynamically significant stenosis. Mild atherosclerotic plaque within both carotid systems as described. 2. Nonspecific tortuosity and ectasia of the mid to distal cervical left ICA, which measures up to 8 mm in diameter. 3. Vertebral arteries patent within the neck bilaterally. Nonstenotic calcified plaque at the origins of both vessels. 4.  Aortic Atherosclerosis (ICD10-I70.0). CTA head: 1. Severe stenosis within the proximal P2 segment of the left posterior cerebral artery. 2. Nonstenotic calcified plaque within the intracranial internal carotid arteries bilaterally. Electronically Signed: By: Kellie Simmering DO On: 10/04/2020 20:21   MR BRAIN WO CONTRAST  Result Date: 10/04/2020 CLINICAL DATA:  Initial evaluation for acute TIA, right-sided numbness and  tingling. Weakness. EXAM: MRI HEAD WITHOUT CONTRAST TECHNIQUE: Multiplanar, multiecho pulse sequences of the brain and surrounding structures were obtained without intravenous contrast. COMPARISON:  Previous MRI from 01/21/2013. FINDINGS: Brain: Cerebral volume within normal limits for age. No significant cerebral white matter disease. Patchy multifocal areas  of small volume restricted diffusion seen involving the parasagittal left temporal occipital region, consistent with an acute to early subacute left PCA distribution infarct (series 5, image 76). Subtle patchy involvement of the left thalamus noted. No associated hemorrhage or mass effect. No other diffusion abnormality to suggest acute or subacute ischemia elsewhere within the brain. Gray-white matter differentiation otherwise maintained. No encephalomalacia to suggest chronic cortical infarction elsewhere. No foci of susceptibility artifact to suggest acute or chronic intracranial hemorrhage. No mass lesion, midline shift or mass effect. No hydrocephalus or extra-axial fluid collection. Pituitary gland suprasellar region normal. Midline structures intact. Vascular: Major intracranial vascular flow voids are maintained. Increased FLAIR signal intensity without associated T1 correlate within the left transverse and sigmoid sinus is likely related to slow/sluggish flow. No frank imaging findings to suggest dural venous thrombosis. Skull and upper cervical spine: Craniocervical junction within normal limits. Bone marrow signal intensity normal. No scalp soft tissue abnormality. Sinuses/Orbits: Globes and orbital soft tissues within normal limits. Paranasal sinuses are clear. No mastoid effusion. Inner ear structures normal. Other: None. IMPRESSION: 1. Patchy small volume acute to early subacute left PCA distribution infarct as above. No associated hemorrhage or mass effect. 2. Otherwise normal brain MRI for age. Electronically Signed   By: Jeannine Boga M.D.   On: 10/04/2020 19:07   ECHOCARDIOGRAM COMPLETE  Result Date: 10/05/2020    ECHOCARDIOGRAM REPORT   Patient Name:   Gwendolyn Morrow Date of Exam: 10/05/2020 Medical Rec #:  [id number]   Height:       66.5 in Accession #:    [id number]  Weight:       135.0 lb Date of Birth:  1941/08/19   BSA:          1.702 m Patient Age:    88 years    BP:           154/78  mmHg Patient Gender: F           HR:           87 bpm. Exam Location:  Inpatient Procedure: 2D Echo, Color Doppler and Cardiac Doppler Indications:    Stroke 434.91 / I163.9  History:        Patient has no prior history of Echocardiogram examinations.                 Risk Factors:Hypertension.  Sonographer:    Bernadene Person RDCS Referring Phys: [id number] [place]  1. Left ventricular ejection fraction, by estimation, is 65 to 70%. The left ventricle has normal function. The left ventricle has no regional wall motion abnormalities. Left ventricular diastolic parameters are consistent with Grade I diastolic dysfunction (impaired relaxation).  2. Right ventricular systolic function is normal. The right ventricular size is normal. There is normal pulmonary artery systolic pressure.  3. The mitral valve is normal in structure. Trivial mitral valve regurgitation. No evidence of mitral stenosis.  4. The aortic valve is tricuspid. Aortic valve regurgitation is not visualized. No aortic stenosis is present.  5. The inferior vena cava is normal in size with greater than 50% respiratory variability, suggesting right atrial pressure of 3 mmHg. FINDINGS  Left Ventricle: Left ventricular ejection fraction, by estimation,  is 65 to 70%. The left ventricle has normal function. The left ventricle has no regional wall motion abnormalities. The left ventricular internal cavity size was normal in size. There is  no left ventricular hypertrophy. Left ventricular diastolic parameters are consistent with Grade I diastolic dysfunction (impaired relaxation). Normal left ventricular filling pressure. Right Ventricle: The right ventricular size is normal. No increase in right ventricular wall thickness. Right ventricular systolic function is normal. There is normal pulmonary artery systolic pressure. The tricuspid regurgitant velocity is 1.33 m/s, and  with an assumed right atrial pressure of 3 mmHg, the estimated right  ventricular systolic pressure is [id number]. Left Atrium: Left atrial size was normal in size. Right Atrium: Right atrial size was normal in size. Pericardium: There is no evidence of pericardial effusion. Mitral Valve: The mitral valve is normal in structure. Trivial mitral valve regurgitation. No evidence of mitral valve stenosis. Tricuspid Valve: The tricuspid valve is normal in structure. Tricuspid valve regurgitation is trivial. No evidence of tricuspid stenosis. Aortic Valve: The aortic valve is tricuspid. Aortic valve regurgitation is not visualized. No aortic stenosis is present. Pulmonic Valve: The pulmonic valve was normal in structure. Pulmonic valve regurgitation is not visualized. No evidence of pulmonic stenosis. Aorta: The aortic root is normal in size and structure. Venous: The inferior vena cava is normal in size with greater than 50% respiratory variability, suggesting right atrial pressure of 3 mmHg. IAS/Shunts: No atrial level shunt detected by color flow Doppler.  LEFT VENTRICLE PLAX 2D LVIDd:         4.50 cm  Diastology LVIDs:         3.30 cm  LV e' medial:    7.94 cm/s LV PW:         0.80 cm  LV E/e' medial:  7.3 LV IVS:        1.00 cm  LV e' lateral:   10.40 cm/s LVOT diam:     1.90 cm  LV E/e' lateral: 5.6 LV SV:         77 LV SV Index:   45 LVOT Area:     2.84 cm  RIGHT VENTRICLE RV S prime:     20.40 cm/s TAPSE (M-mode): 2.3 cm LEFT ATRIUM             Index       RIGHT ATRIUM           Index LA diam:        3.00 cm 1.76 cm/m  RA Area:     14.10 cm LA Vol (A2C):   40.4 ml 23.74 ml/m RA Volume:   32.00 ml  18.81 ml/m LA Vol (A4C):   40.6 ml 23.86 ml/m LA Biplane Vol: 44.6 ml 26.21 ml/m  AORTIC VALVE LVOT Vmax:   122.00 cm/s LVOT Vmean:  94.300 cm/s LVOT VTI:    0.272 m  AORTA Ao Root diam: 3.30 cm MITRAL VALVE                TRICUSPID VALVE MV Area (PHT): 2.29 cm     TR Peak grad:   7.1 mmHg MV Decel Time: 331 msec     TR Vmax:        133.00 cm/s MV E velocity: 58.00 cm/s MV A  velocity: 108.00 cm/s  SHUNTS MV E/A ratio:  0.54         Systemic VTI:  0.27 m  Systemic Diam: 1.90 cm Skeet Latch MD Electronically signed by Skeet Latch MD Signature Date/Time: 10/05/2020/9:38:46 AM    Final     Echocardiogram  EF 65-70%, LA 30  12-lead ECG sinus rhythm, rate 88 Telemetry sinus rhythm, no afib detected   Assessment and Plan 1. Cryptogenic stroke The patient presents with stroke of unknown source.  Workup has been unrevealing thus far.  There is concern for cardioembolic source.  I recommend loop recorder insertion to monitor for AF. The indication for loop recorder insertion / monitoring for AF in setting of cryptogenic stroke was discussed with the patient. The loop recorder insertion procedure was reviewed in detail including risks and benefits. These risks include but are not limited to bleeding and infection. The patient expressed verbal understanding and agrees to proceed. The patient was also counseled regarding wound care and device follow-up.  Thompson Grayer MD, [place] 10/06/2020 4:28 PM

## 2020-10-07 ENCOUNTER — Encounter (HOSPITAL_COMMUNITY): Payer: Self-pay | Admitting: Internal Medicine

## 2020-10-07 MED ORDER — ATORVASTATIN CALCIUM 40 MG PO TABS: 40.0000 mg | ORAL_TABLET | Freq: Every day | ORAL | 2 refills | Status: DC

## 2020-10-07 MED ORDER — CLOPIDOGREL BISULFATE 75 MG PO TABS: 75.0000 mg | ORAL_TABLET | Freq: Every day | ORAL | 11 refills | Status: AC

## 2020-10-07 MED ORDER — ASPIRIN EC 81 MG PO TBEC: 81.0000 mg | DELAYED_RELEASE_TABLET | Freq: Every day | ORAL | 0 refills | Status: AC

## 2020-10-07 NOTE — Plan of Care (Signed)
  Problem: Education: Goal: Knowledge of disease or condition will improve Outcome: Adequate for Discharge Goal: Knowledge of secondary prevention will improve Outcome: Adequate for Discharge Goal: Knowledge of patient specific risk factors addressed and post discharge goals established will improve Outcome: Adequate for Discharge Goal: Individualized Educational Video(s) Outcome: Adequate for Discharge

## 2020-10-07 NOTE — Care Management Important Message (Signed)
Important Message  Patient Details  Name: Gwendolyn Morrow MRN: [id number] Date of Birth: 07-May-1941   Medicare Important Message Given:  Yes   Patient left prior to IM delivery IM will be mailed to home address.   Grason Brailsford 10/07/2020, 2:34 PM

## 2020-10-07 NOTE — TOC Transition Note (Signed)
Transition of Care [facility]) - CM/SW Discharge Note   Patient Details  Name: Gwendolyn Morrow MRN: [id number] Date of Birth: 1941-08-19  [facility] CM/SW Contact:  Pollie Friar, RN Phone Number: 10/07/2020, 10:43 AM   Clinical Narrative:    Pt discharging home with self care. No f/u per PT/OT and no DME needs.  Pt has transport home.   Final next level of care: Home/Self Care Barriers to Discharge: No Barriers Identified   Patient Goals and CMS Choice        Discharge Placement                       Discharge Plan and Services                                     Social Determinants of Health (SDOH) Interventions     Readmission Risk Interventions No flowsheet data found.

## 2020-10-07 NOTE — Progress Notes (Signed)
Patient IV removed, site clean, dry and intact. Discharge instructions reviewed, all questions answered. Patient transport via wheelchair to private vehicle driven by friend.

## 2020-10-07 NOTE — Discharge Summary (Signed)
Initial evaluation for acute TIA, right-sided numbness and tingling. Weakness. EXAM: MRI HEAD WITHOUT CONTRAST TECHNIQUE: Multiplanar, multiecho pulse sequences of the brain and surrounding structures were obtained without intravenous contrast. COMPARISON:  Previous MRI from 01/21/2013. FINDINGS: Brain: Cerebral volume within normal limits for age. No significant cerebral white matter disease. Patchy multifocal areas of small volume restricted diffusion seen involving the parasagittal left temporal occipital region, consistent with an acute to early subacute left PCA distribution infarct (series 5, image 76). Subtle patchy involvement of the left thalamus noted. No associated hemorrhage or mass effect. No other diffusion abnormality to suggest acute or subacute ischemia elsewhere within the brain. Gray-white matter differentiation otherwise maintained. No encephalomalacia to suggest chronic cortical infarction elsewhere. No foci of susceptibility artifact to suggest acute or chronic intracranial hemorrhage. No mass lesion, midline shift or mass effect. No hydrocephalus or extra-axial fluid collection. Pituitary gland suprasellar region normal. Midline structures intact. Vascular: Major intracranial vascular flow voids are maintained. Increased FLAIR signal intensity without associated T1 correlate within the left transverse and sigmoid sinus is likely related to slow/sluggish flow. No frank imaging findings to suggest dural venous thrombosis. Skull and upper cervical spine: Craniocervical junction within normal limits. Bone marrow signal intensity normal. No scalp soft tissue abnormality. Sinuses/Orbits: Globes and orbital soft tissues within normal limits. Paranasal sinuses are clear. No mastoid effusion. Inner ear structures normal. Other: None. IMPRESSION: 1. Patchy small volume acute to early subacute left PCA distribution infarct as above. No associated  hemorrhage or mass effect. 2. Otherwise normal brain MRI for age. Electronically Signed   By: Jeannine Boga M.D.   On: 10/04/2020 19:07   ECHOCARDIOGRAM COMPLETE  Result Date: 10/05/2020    ECHOCARDIOGRAM REPORT   Patient Name:   Gwendolyn Morrow Date of Exam: 10/05/2020 Medical Rec #:  [id number]   Height:       66.5 in Accession #:    [id number]  Weight:       135.0 lb Date of Birth:  [birth year]   BSA:          1.702 m Patient Age:    80 years    BP:           154/78 mmHg Patient Gender: F           HR:           87 bpm. Exam Location:  Inpatient Procedure: 2D Echo, Color Doppler and Cardiac Doppler Indications:    Stroke 434.91 / I163.9  History:        Patient has no prior history of Echocardiogram examinations.                 Risk Factors:Hypertension.  Sonographer:    Bernadene Person RDCS Referring Phys: [id number] Haring  1. Left ventricular ejection fraction, by estimation, is 65 to 70%. The left ventricle has normal function. The left ventricle has no regional wall motion abnormalities. Left ventricular diastolic parameters are consistent with Grade I diastolic dysfunction (impaired relaxation).  2. Right ventricular systolic function is normal. The right ventricular size is normal. There is normal pulmonary artery systolic pressure.  3. The mitral valve is normal in structure. Trivial mitral valve regurgitation. No evidence of mitral stenosis.  4. The aortic valve is tricuspid. Aortic valve regurgitation is not visualized. No aortic stenosis is present.  5. The inferior vena cava is normal in size with greater than 50% respiratory variability, suggesting right atrial pressure of 3 mmHg. FINDINGS  Left  Physician Discharge Summary  MARKEITA TRUCHAN [id number] DOB: Nov 24, 1940 DOA: 10/04/2020  PCP: Lujean Amel, MD  Admit date: 10/04/2020 Discharge date: 10/07/2020  Admitted From: Home  Discharge disposition: Home  Recommendations for Outpatient Follow-Up:    Follow up with your primary care provider in one week.   Check CBC, BMP, magnesium in the next visit  Follow-up with [facility] as scheduled by the clinic.   Discharge Diagnosis:   Principal Problem:   Acute ischemic stroke [facility]) Active Problems:   Hypertension   Acute CVA (cerebrovascular accident) [facility])   Discharge Condition: Improved.  Diet recommendation: Low sodium, heart healthy  Wound care: Local chest wall site care  Code status: Full.   History of Present Illness:   Gwendolyn Morrow is a 80 y.o. female with medical history of hypertension presented to ED with complaint of right-sided numbness and weakness and difficulty concentrating.  Patient initially had minor symptoms 3 days back but 1 day prior to presentation started developing numbness of the right upper and lower extremity including the face with weakness.    In the ED, patient was found to be afebrile, saturating well on room air, tachycardic in the 110s initially, and with modest elevation of blood pressure.  EKG featured a sinus rhythm.  Chemistry panel was unremarkable and CBC was notable for slight polycythemia.  MRI brain revealed patchy small volume acute to early subacute PCA distribution infarction.  CTA head and neck was notable for severe stenosis involving the proximal P2 segment of left PCA.  Neurology was consulted by the ED physician, Plavix and IV fluids given in the ED. Patient was then admitted to hospital for further evaluation and treatment.    Hospital Course:   Following conditions were addressed during hospitalization as listed below,  Acute/ subacute PCA territory infarct  Patient presenting with  right-sided numbness and questionable weakness.  MRI brain revealed patchy small volume acute to early subacute left PCA distribution infarction without mass-effect or hemorrhage.  CTA head and neck reveals high-grade stenosis involving left PCA, proximal P2 segment.  Neurology was consulted who recommended aspirin and Plavix on discharge.  Plavix to be continued indefinitely due to questionable aspirin allergy.   Patient was given aspirin during hospitalization which she has tolerated.    Hemoglobin A1c of 5.3.  COVID-19 was negative.  Urinalysis was negative.  PT OT speech evaluation was performed during hospitalization and patient did not require any skilled therapy needs with.  Neurology recommended outpatient neurology follow-up in 4 weeks.  Neurology also recommended loop recorder and cardiology was consulted.  Patient underwent a loop recorder insertion on 10/06/2020.   Essential hypertension:  Will be resumed on her home medication on discharge   Hyperlipidemia: LDL 90.    On Lipitor 20 at home.  This has been increased to 40 mg daily on discharge.Marland Kitchen   GERD: On PPI.  Disposition.  At this time, patient is stable for disposition home.  Patient will need to follow-up with her primary care physician, neurology as outpatient  Medical Consultants:    Neurology  Electrophysiology cardiology  Procedures:    Loop recorder insertion on 10/06/20. Subjective:   Today, patient was seen and examined at bedside.  Patient denies any weakness, tingling or numbness.  Denies nausea, vomiting, fever chills or shortness of breath.  Discharge Exam:   Vitals:   10/06/20 2336 10/07/20 0746  BP: (!) 153/83 (!) 152/80  Pulse: 74 70  Resp: 18 18  Temp: 98  Initial evaluation for acute TIA, right-sided numbness and tingling. Weakness. EXAM: MRI HEAD WITHOUT CONTRAST TECHNIQUE: Multiplanar, multiecho pulse sequences of the brain and surrounding structures were obtained without intravenous contrast. COMPARISON:  Previous MRI from 01/21/2013. FINDINGS: Brain: Cerebral volume within normal limits for age. No significant cerebral white matter disease. Patchy multifocal areas of small volume restricted diffusion seen involving the parasagittal left temporal occipital region, consistent with an acute to early subacute left PCA distribution infarct (series 5, image 76). Subtle patchy involvement of the left thalamus noted. No associated hemorrhage or mass effect. No other diffusion abnormality to suggest acute or subacute ischemia elsewhere within the brain. Gray-white matter differentiation otherwise maintained. No encephalomalacia to suggest chronic cortical infarction elsewhere. No foci of susceptibility artifact to suggest acute or chronic intracranial hemorrhage. No mass lesion, midline shift or mass effect. No hydrocephalus or extra-axial fluid collection. Pituitary gland suprasellar region normal. Midline structures intact. Vascular: Major intracranial vascular flow voids are maintained. Increased FLAIR signal intensity without associated T1 correlate within the left transverse and sigmoid sinus is likely related to slow/sluggish flow. No frank imaging findings to suggest dural venous thrombosis. Skull and upper cervical spine: Craniocervical junction within normal limits. Bone marrow signal intensity normal. No scalp soft tissue abnormality. Sinuses/Orbits: Globes and orbital soft tissues within normal limits. Paranasal sinuses are clear. No mastoid effusion. Inner ear structures normal. Other: None. IMPRESSION: 1. Patchy small volume acute to early subacute left PCA distribution infarct as above. No associated  hemorrhage or mass effect. 2. Otherwise normal brain MRI for age. Electronically Signed   By: Jeannine Boga M.D.   On: 10/04/2020 19:07   ECHOCARDIOGRAM COMPLETE  Result Date: 10/05/2020    ECHOCARDIOGRAM REPORT   Patient Name:   Gwendolyn Morrow Date of Exam: 10/05/2020 Medical Rec #:  [id number]   Height:       66.5 in Accession #:    [id number]  Weight:       135.0 lb Date of Birth:  [birth year]   BSA:          1.702 m Patient Age:    80 years    BP:           154/78 mmHg Patient Gender: F           HR:           87 bpm. Exam Location:  Inpatient Procedure: 2D Echo, Color Doppler and Cardiac Doppler Indications:    Stroke 434.91 / I163.9  History:        Patient has no prior history of Echocardiogram examinations.                 Risk Factors:Hypertension.  Sonographer:    Bernadene Person RDCS Referring Phys: [id number] Haring  1. Left ventricular ejection fraction, by estimation, is 65 to 70%. The left ventricle has normal function. The left ventricle has no regional wall motion abnormalities. Left ventricular diastolic parameters are consistent with Grade I diastolic dysfunction (impaired relaxation).  2. Right ventricular systolic function is normal. The right ventricular size is normal. There is normal pulmonary artery systolic pressure.  3. The mitral valve is normal in structure. Trivial mitral valve regurgitation. No evidence of mitral stenosis.  4. The aortic valve is tricuspid. Aortic valve regurgitation is not visualized. No aortic stenosis is present.  5. The inferior vena cava is normal in size with greater than 50% respiratory variability, suggesting right atrial pressure of 3 mmHg. FINDINGS  Left  Physician Discharge Summary  MARKEITA TRUCHAN [id number] DOB: Nov 24, 1940 DOA: 10/04/2020  PCP: Lujean Amel, MD  Admit date: 10/04/2020 Discharge date: 10/07/2020  Admitted From: Home  Discharge disposition: Home  Recommendations for Outpatient Follow-Up:    Follow up with your primary care provider in one week.   Check CBC, BMP, magnesium in the next visit  Follow-up with [facility] as scheduled by the clinic.   Discharge Diagnosis:   Principal Problem:   Acute ischemic stroke [facility]) Active Problems:   Hypertension   Acute CVA (cerebrovascular accident) [facility])   Discharge Condition: Improved.  Diet recommendation: Low sodium, heart healthy  Wound care: Local chest wall site care  Code status: Full.   History of Present Illness:   Gwendolyn Morrow is a 80 y.o. female with medical history of hypertension presented to ED with complaint of right-sided numbness and weakness and difficulty concentrating.  Patient initially had minor symptoms 3 days back but 1 day prior to presentation started developing numbness of the right upper and lower extremity including the face with weakness.    In the ED, patient was found to be afebrile, saturating well on room air, tachycardic in the 110s initially, and with modest elevation of blood pressure.  EKG featured a sinus rhythm.  Chemistry panel was unremarkable and CBC was notable for slight polycythemia.  MRI brain revealed patchy small volume acute to early subacute PCA distribution infarction.  CTA head and neck was notable for severe stenosis involving the proximal P2 segment of left PCA.  Neurology was consulted by the ED physician, Plavix and IV fluids given in the ED. Patient was then admitted to hospital for further evaluation and treatment.    Hospital Course:   Following conditions were addressed during hospitalization as listed below,  Acute/ subacute PCA territory infarct  Patient presenting with  right-sided numbness and questionable weakness.  MRI brain revealed patchy small volume acute to early subacute left PCA distribution infarction without mass-effect or hemorrhage.  CTA head and neck reveals high-grade stenosis involving left PCA, proximal P2 segment.  Neurology was consulted who recommended aspirin and Plavix on discharge.  Plavix to be continued indefinitely due to questionable aspirin allergy.   Patient was given aspirin during hospitalization which she has tolerated.    Hemoglobin A1c of 5.3.  COVID-19 was negative.  Urinalysis was negative.  PT OT speech evaluation was performed during hospitalization and patient did not require any skilled therapy needs with.  Neurology recommended outpatient neurology follow-up in 4 weeks.  Neurology also recommended loop recorder and cardiology was consulted.  Patient underwent a loop recorder insertion on 10/06/2020.   Essential hypertension:  Will be resumed on her home medication on discharge   Hyperlipidemia: LDL 90.    On Lipitor 20 at home.  This has been increased to 40 mg daily on discharge.Marland Kitchen   GERD: On PPI.  Disposition.  At this time, patient is stable for disposition home.  Patient will need to follow-up with her primary care physician, neurology as outpatient  Medical Consultants:    Neurology  Electrophysiology cardiology  Procedures:    Loop recorder insertion on 10/06/20. Subjective:   Today, patient was seen and examined at bedside.  Patient denies any weakness, tingling or numbness.  Denies nausea, vomiting, fever chills or shortness of breath.  Discharge Exam:   Vitals:   10/06/20 2336 10/07/20 0746  BP: (!) 153/83 (!) 152/80  Pulse: 74 70  Resp: 18 18  Temp: 98  Physician Discharge Summary  MARKEITA TRUCHAN [id number] DOB: Nov 24, 1940 DOA: 10/04/2020  PCP: Lujean Amel, MD  Admit date: 10/04/2020 Discharge date: 10/07/2020  Admitted From: Home  Discharge disposition: Home  Recommendations for Outpatient Follow-Up:    Follow up with your primary care provider in one week.   Check CBC, BMP, magnesium in the next visit  Follow-up with [facility] as scheduled by the clinic.   Discharge Diagnosis:   Principal Problem:   Acute ischemic stroke [facility]) Active Problems:   Hypertension   Acute CVA (cerebrovascular accident) [facility])   Discharge Condition: Improved.  Diet recommendation: Low sodium, heart healthy  Wound care: Local chest wall site care  Code status: Full.   History of Present Illness:   Gwendolyn Morrow is a 80 y.o. female with medical history of hypertension presented to ED with complaint of right-sided numbness and weakness and difficulty concentrating.  Patient initially had minor symptoms 3 days back but 1 day prior to presentation started developing numbness of the right upper and lower extremity including the face with weakness.    In the ED, patient was found to be afebrile, saturating well on room air, tachycardic in the 110s initially, and with modest elevation of blood pressure.  EKG featured a sinus rhythm.  Chemistry panel was unremarkable and CBC was notable for slight polycythemia.  MRI brain revealed patchy small volume acute to early subacute PCA distribution infarction.  CTA head and neck was notable for severe stenosis involving the proximal P2 segment of left PCA.  Neurology was consulted by the ED physician, Plavix and IV fluids given in the ED. Patient was then admitted to hospital for further evaluation and treatment.    Hospital Course:   Following conditions were addressed during hospitalization as listed below,  Acute/ subacute PCA territory infarct  Patient presenting with  right-sided numbness and questionable weakness.  MRI brain revealed patchy small volume acute to early subacute left PCA distribution infarction without mass-effect or hemorrhage.  CTA head and neck reveals high-grade stenosis involving left PCA, proximal P2 segment.  Neurology was consulted who recommended aspirin and Plavix on discharge.  Plavix to be continued indefinitely due to questionable aspirin allergy.   Patient was given aspirin during hospitalization which she has tolerated.    Hemoglobin A1c of 5.3.  COVID-19 was negative.  Urinalysis was negative.  PT OT speech evaluation was performed during hospitalization and patient did not require any skilled therapy needs with.  Neurology recommended outpatient neurology follow-up in 4 weeks.  Neurology also recommended loop recorder and cardiology was consulted.  Patient underwent a loop recorder insertion on 10/06/2020.   Essential hypertension:  Will be resumed on her home medication on discharge   Hyperlipidemia: LDL 90.    On Lipitor 20 at home.  This has been increased to 40 mg daily on discharge.Marland Kitchen   GERD: On PPI.  Disposition.  At this time, patient is stable for disposition home.  Patient will need to follow-up with her primary care physician, neurology as outpatient  Medical Consultants:    Neurology  Electrophysiology cardiology  Procedures:    Loop recorder insertion on 10/06/20. Subjective:   Today, patient was seen and examined at bedside.  Patient denies any weakness, tingling or numbness.  Denies nausea, vomiting, fever chills or shortness of breath.  Discharge Exam:   Vitals:   10/06/20 2336 10/07/20 0746  BP: (!) 153/83 (!) 152/80  Pulse: 74 70  Resp: 18 18  Temp: 98  Initial evaluation for acute TIA, right-sided numbness and tingling. Weakness. EXAM: MRI HEAD WITHOUT CONTRAST TECHNIQUE: Multiplanar, multiecho pulse sequences of the brain and surrounding structures were obtained without intravenous contrast. COMPARISON:  Previous MRI from 01/21/2013. FINDINGS: Brain: Cerebral volume within normal limits for age. No significant cerebral white matter disease. Patchy multifocal areas of small volume restricted diffusion seen involving the parasagittal left temporal occipital region, consistent with an acute to early subacute left PCA distribution infarct (series 5, image 76). Subtle patchy involvement of the left thalamus noted. No associated hemorrhage or mass effect. No other diffusion abnormality to suggest acute or subacute ischemia elsewhere within the brain. Gray-white matter differentiation otherwise maintained. No encephalomalacia to suggest chronic cortical infarction elsewhere. No foci of susceptibility artifact to suggest acute or chronic intracranial hemorrhage. No mass lesion, midline shift or mass effect. No hydrocephalus or extra-axial fluid collection. Pituitary gland suprasellar region normal. Midline structures intact. Vascular: Major intracranial vascular flow voids are maintained. Increased FLAIR signal intensity without associated T1 correlate within the left transverse and sigmoid sinus is likely related to slow/sluggish flow. No frank imaging findings to suggest dural venous thrombosis. Skull and upper cervical spine: Craniocervical junction within normal limits. Bone marrow signal intensity normal. No scalp soft tissue abnormality. Sinuses/Orbits: Globes and orbital soft tissues within normal limits. Paranasal sinuses are clear. No mastoid effusion. Inner ear structures normal. Other: None. IMPRESSION: 1. Patchy small volume acute to early subacute left PCA distribution infarct as above. No associated  hemorrhage or mass effect. 2. Otherwise normal brain MRI for age. Electronically Signed   By: Jeannine Boga M.D.   On: 10/04/2020 19:07   ECHOCARDIOGRAM COMPLETE  Result Date: 10/05/2020    ECHOCARDIOGRAM REPORT   Patient Name:   Gwendolyn Morrow Date of Exam: 10/05/2020 Medical Rec #:  [id number]   Height:       66.5 in Accession #:    [id number]  Weight:       135.0 lb Date of Birth:  [birth year]   BSA:          1.702 m Patient Age:    80 years    BP:           154/78 mmHg Patient Gender: F           HR:           87 bpm. Exam Location:  Inpatient Procedure: 2D Echo, Color Doppler and Cardiac Doppler Indications:    Stroke 434.91 / I163.9  History:        Patient has no prior history of Echocardiogram examinations.                 Risk Factors:Hypertension.  Sonographer:    Bernadene Person RDCS Referring Phys: [id number] Haring  1. Left ventricular ejection fraction, by estimation, is 65 to 70%. The left ventricle has normal function. The left ventricle has no regional wall motion abnormalities. Left ventricular diastolic parameters are consistent with Grade I diastolic dysfunction (impaired relaxation).  2. Right ventricular systolic function is normal. The right ventricular size is normal. There is normal pulmonary artery systolic pressure.  3. The mitral valve is normal in structure. Trivial mitral valve regurgitation. No evidence of mitral stenosis.  4. The aortic valve is tricuspid. Aortic valve regurgitation is not visualized. No aortic stenosis is present.  5. The inferior vena cava is normal in size with greater than 50% respiratory variability, suggesting right atrial pressure of 3 mmHg. FINDINGS  Left  Physician Discharge Summary  MARKEITA TRUCHAN [id number] DOB: Nov 24, 1940 DOA: 10/04/2020  PCP: Lujean Amel, MD  Admit date: 10/04/2020 Discharge date: 10/07/2020  Admitted From: Home  Discharge disposition: Home  Recommendations for Outpatient Follow-Up:    Follow up with your primary care provider in one week.   Check CBC, BMP, magnesium in the next visit  Follow-up with [facility] as scheduled by the clinic.   Discharge Diagnosis:   Principal Problem:   Acute ischemic stroke [facility]) Active Problems:   Hypertension   Acute CVA (cerebrovascular accident) [facility])   Discharge Condition: Improved.  Diet recommendation: Low sodium, heart healthy  Wound care: Local chest wall site care  Code status: Full.   History of Present Illness:   Gwendolyn Morrow is a 80 y.o. female with medical history of hypertension presented to ED with complaint of right-sided numbness and weakness and difficulty concentrating.  Patient initially had minor symptoms 3 days back but 1 day prior to presentation started developing numbness of the right upper and lower extremity including the face with weakness.    In the ED, patient was found to be afebrile, saturating well on room air, tachycardic in the 110s initially, and with modest elevation of blood pressure.  EKG featured a sinus rhythm.  Chemistry panel was unremarkable and CBC was notable for slight polycythemia.  MRI brain revealed patchy small volume acute to early subacute PCA distribution infarction.  CTA head and neck was notable for severe stenosis involving the proximal P2 segment of left PCA.  Neurology was consulted by the ED physician, Plavix and IV fluids given in the ED. Patient was then admitted to hospital for further evaluation and treatment.    Hospital Course:   Following conditions were addressed during hospitalization as listed below,  Acute/ subacute PCA territory infarct  Patient presenting with  right-sided numbness and questionable weakness.  MRI brain revealed patchy small volume acute to early subacute left PCA distribution infarction without mass-effect or hemorrhage.  CTA head and neck reveals high-grade stenosis involving left PCA, proximal P2 segment.  Neurology was consulted who recommended aspirin and Plavix on discharge.  Plavix to be continued indefinitely due to questionable aspirin allergy.   Patient was given aspirin during hospitalization which she has tolerated.    Hemoglobin A1c of 5.3.  COVID-19 was negative.  Urinalysis was negative.  PT OT speech evaluation was performed during hospitalization and patient did not require any skilled therapy needs with.  Neurology recommended outpatient neurology follow-up in 4 weeks.  Neurology also recommended loop recorder and cardiology was consulted.  Patient underwent a loop recorder insertion on 10/06/2020.   Essential hypertension:  Will be resumed on her home medication on discharge   Hyperlipidemia: LDL 90.    On Lipitor 20 at home.  This has been increased to 40 mg daily on discharge.Marland Kitchen   GERD: On PPI.  Disposition.  At this time, patient is stable for disposition home.  Patient will need to follow-up with her primary care physician, neurology as outpatient  Medical Consultants:    Neurology  Electrophysiology cardiology  Procedures:    Loop recorder insertion on 10/06/20. Subjective:   Today, patient was seen and examined at bedside.  Patient denies any weakness, tingling or numbness.  Denies nausea, vomiting, fever chills or shortness of breath.  Discharge Exam:   Vitals:   10/06/20 2336 10/07/20 0746  BP: (!) 153/83 (!) 152/80  Pulse: 74 70  Resp: 18 18  Temp: 98  Initial evaluation for acute TIA, right-sided numbness and tingling. Weakness. EXAM: MRI HEAD WITHOUT CONTRAST TECHNIQUE: Multiplanar, multiecho pulse sequences of the brain and surrounding structures were obtained without intravenous contrast. COMPARISON:  Previous MRI from 01/21/2013. FINDINGS: Brain: Cerebral volume within normal limits for age. No significant cerebral white matter disease. Patchy multifocal areas of small volume restricted diffusion seen involving the parasagittal left temporal occipital region, consistent with an acute to early subacute left PCA distribution infarct (series 5, image 76). Subtle patchy involvement of the left thalamus noted. No associated hemorrhage or mass effect. No other diffusion abnormality to suggest acute or subacute ischemia elsewhere within the brain. Gray-white matter differentiation otherwise maintained. No encephalomalacia to suggest chronic cortical infarction elsewhere. No foci of susceptibility artifact to suggest acute or chronic intracranial hemorrhage. No mass lesion, midline shift or mass effect. No hydrocephalus or extra-axial fluid collection. Pituitary gland suprasellar region normal. Midline structures intact. Vascular: Major intracranial vascular flow voids are maintained. Increased FLAIR signal intensity without associated T1 correlate within the left transverse and sigmoid sinus is likely related to slow/sluggish flow. No frank imaging findings to suggest dural venous thrombosis. Skull and upper cervical spine: Craniocervical junction within normal limits. Bone marrow signal intensity normal. No scalp soft tissue abnormality. Sinuses/Orbits: Globes and orbital soft tissues within normal limits. Paranasal sinuses are clear. No mastoid effusion. Inner ear structures normal. Other: None. IMPRESSION: 1. Patchy small volume acute to early subacute left PCA distribution infarct as above. No associated  hemorrhage or mass effect. 2. Otherwise normal brain MRI for age. Electronically Signed   By: Jeannine Boga M.D.   On: 10/04/2020 19:07   ECHOCARDIOGRAM COMPLETE  Result Date: 10/05/2020    ECHOCARDIOGRAM REPORT   Patient Name:   Gwendolyn Morrow Date of Exam: 10/05/2020 Medical Rec #:  [id number]   Height:       66.5 in Accession #:    [id number]  Weight:       135.0 lb Date of Birth:  [birth year]   BSA:          1.702 m Patient Age:    80 years    BP:           154/78 mmHg Patient Gender: F           HR:           87 bpm. Exam Location:  Inpatient Procedure: 2D Echo, Color Doppler and Cardiac Doppler Indications:    Stroke 434.91 / I163.9  History:        Patient has no prior history of Echocardiogram examinations.                 Risk Factors:Hypertension.  Sonographer:    Bernadene Person RDCS Referring Phys: [id number] Haring  1. Left ventricular ejection fraction, by estimation, is 65 to 70%. The left ventricle has normal function. The left ventricle has no regional wall motion abnormalities. Left ventricular diastolic parameters are consistent with Grade I diastolic dysfunction (impaired relaxation).  2. Right ventricular systolic function is normal. The right ventricular size is normal. There is normal pulmonary artery systolic pressure.  3. The mitral valve is normal in structure. Trivial mitral valve regurgitation. No evidence of mitral stenosis.  4. The aortic valve is tricuspid. Aortic valve regurgitation is not visualized. No aortic stenosis is present.  5. The inferior vena cava is normal in size with greater than 50% respiratory variability, suggesting right atrial pressure of 3 mmHg. FINDINGS  Left  Initial evaluation for acute TIA, right-sided numbness and tingling. Weakness. EXAM: MRI HEAD WITHOUT CONTRAST TECHNIQUE: Multiplanar, multiecho pulse sequences of the brain and surrounding structures were obtained without intravenous contrast. COMPARISON:  Previous MRI from 01/21/2013. FINDINGS: Brain: Cerebral volume within normal limits for age. No significant cerebral white matter disease. Patchy multifocal areas of small volume restricted diffusion seen involving the parasagittal left temporal occipital region, consistent with an acute to early subacute left PCA distribution infarct (series 5, image 76). Subtle patchy involvement of the left thalamus noted. No associated hemorrhage or mass effect. No other diffusion abnormality to suggest acute or subacute ischemia elsewhere within the brain. Gray-white matter differentiation otherwise maintained. No encephalomalacia to suggest chronic cortical infarction elsewhere. No foci of susceptibility artifact to suggest acute or chronic intracranial hemorrhage. No mass lesion, midline shift or mass effect. No hydrocephalus or extra-axial fluid collection. Pituitary gland suprasellar region normal. Midline structures intact. Vascular: Major intracranial vascular flow voids are maintained. Increased FLAIR signal intensity without associated T1 correlate within the left transverse and sigmoid sinus is likely related to slow/sluggish flow. No frank imaging findings to suggest dural venous thrombosis. Skull and upper cervical spine: Craniocervical junction within normal limits. Bone marrow signal intensity normal. No scalp soft tissue abnormality. Sinuses/Orbits: Globes and orbital soft tissues within normal limits. Paranasal sinuses are clear. No mastoid effusion. Inner ear structures normal. Other: None. IMPRESSION: 1. Patchy small volume acute to early subacute left PCA distribution infarct as above. No associated  hemorrhage or mass effect. 2. Otherwise normal brain MRI for age. Electronically Signed   By: Jeannine Boga M.D.   On: 10/04/2020 19:07   ECHOCARDIOGRAM COMPLETE  Result Date: 10/05/2020    ECHOCARDIOGRAM REPORT   Patient Name:   Gwendolyn Morrow Date of Exam: 10/05/2020 Medical Rec #:  [id number]   Height:       66.5 in Accession #:    [id number]  Weight:       135.0 lb Date of Birth:  [birth year]   BSA:          1.702 m Patient Age:    80 years    BP:           154/78 mmHg Patient Gender: F           HR:           87 bpm. Exam Location:  Inpatient Procedure: 2D Echo, Color Doppler and Cardiac Doppler Indications:    Stroke 434.91 / I163.9  History:        Patient has no prior history of Echocardiogram examinations.                 Risk Factors:Hypertension.  Sonographer:    Bernadene Person RDCS Referring Phys: [id number] Haring  1. Left ventricular ejection fraction, by estimation, is 65 to 70%. The left ventricle has normal function. The left ventricle has no regional wall motion abnormalities. Left ventricular diastolic parameters are consistent with Grade I diastolic dysfunction (impaired relaxation).  2. Right ventricular systolic function is normal. The right ventricular size is normal. There is normal pulmonary artery systolic pressure.  3. The mitral valve is normal in structure. Trivial mitral valve regurgitation. No evidence of mitral stenosis.  4. The aortic valve is tricuspid. Aortic valve regurgitation is not visualized. No aortic stenosis is present.  5. The inferior vena cava is normal in size with greater than 50% respiratory variability, suggesting right atrial pressure of 3 mmHg. FINDINGS  Left

## 2020-10-12 DIAGNOSIS — H2513 Age-related nuclear cataract, bilateral: Secondary | ICD-10-CM | POA: Diagnosis not present

## 2020-10-14 DIAGNOSIS — Z79899 Other long term (current) drug therapy: Secondary | ICD-10-CM | POA: Diagnosis not present

## 2020-10-14 DIAGNOSIS — I10 Essential (primary) hypertension: Secondary | ICD-10-CM | POA: Diagnosis not present

## 2020-10-14 DIAGNOSIS — E7800 Pure hypercholesterolemia, unspecified: Secondary | ICD-10-CM | POA: Diagnosis not present

## 2020-10-14 DIAGNOSIS — I639 Cerebral infarction, unspecified: Secondary | ICD-10-CM | POA: Diagnosis not present

## 2020-10-19 DIAGNOSIS — R300 Dysuria: Secondary | ICD-10-CM | POA: Diagnosis not present

## 2020-10-20 ENCOUNTER — Ambulatory Visit (INDEPENDENT_AMBULATORY_CARE_PROVIDER_SITE_OTHER): Payer: Medicare Other | Admitting: Emergency Medicine

## 2020-10-20 ENCOUNTER — Other Ambulatory Visit: Payer: Self-pay

## 2020-10-20 DIAGNOSIS — I639 Cerebral infarction, unspecified: Secondary | ICD-10-CM

## 2020-10-20 LAB — CUP PACEART INCLINIC DEVICE CHECK
Date Time Interrogation Session: 20220203120629
Implantable Pulse Generator Implant Date: 20220120

## 2020-10-20 NOTE — Progress Notes (Signed)
ILR wound check in clinic. Steri strips removed. Wound well healed. Home monitor transmitting nightly. No episodes. Questions answered.  

## 2020-10-27 DIAGNOSIS — N3020 Other chronic cystitis without hematuria: Secondary | ICD-10-CM | POA: Diagnosis not present

## 2020-10-27 DIAGNOSIS — N952 Postmenopausal atrophic vaginitis: Secondary | ICD-10-CM | POA: Diagnosis not present

## 2020-10-31 ENCOUNTER — Telehealth: Payer: Self-pay | Admitting: Internal Medicine

## 2020-10-31 ENCOUNTER — Other Ambulatory Visit: Payer: Self-pay

## 2020-10-31 ENCOUNTER — Ambulatory Visit: Payer: Medicare Other | Admitting: Emergency Medicine

## 2020-10-31 DIAGNOSIS — L03818 Cellulitis of other sites: Secondary | ICD-10-CM

## 2020-10-31 MED ORDER — CLINDAMYCIN HCL 300 MG PO CAPS: 300.0000 mg | ORAL_CAPSULE | Freq: Three times a day (TID) | ORAL | 0 refills | Status: DC

## 2020-10-31 MED ORDER — CLINDAMYCIN HCL 150 MG PO CAPS: 300.0000 mg | ORAL_CAPSULE | Freq: Three times a day (TID) | ORAL | Status: DC

## 2020-10-31 NOTE — Telephone Encounter (Signed)
Picture received. Requested patient come into device clinic today for apt. Apt. Made at 3:00 PM. Location, date and time discussed with verbal understanding.

## 2020-10-31 NOTE — Patient Instructions (Signed)
Please call the device clinic if you see increased redness, swelling, fever or chills. We have sent your prescription to the pharmacy. Please be sure to eat something during each dose of medication. Please call with further questions or concerns.   [facility] [phone]

## 2020-10-31 NOTE — Telephone Encounter (Signed)
New message:     patient calling stating that she had a heart monitor put in and the where the implant is located starting to turn red and very sore  the patient do not know what to do. Please call patient.

## 2020-10-31 NOTE — Progress Notes (Signed)
Patient seen in [facility] for wound check to ILR site. Redness and tenderness noted to left breast. Dr. Rayann Heman came to evaluate patient and would like to start patient in Clindamycin 300 mg po TID x1 week. Would like to see patient back in his clinic on 11/07/20. Patient advised to call If worsening, fever or chills. Verbalized understanding.

## 2020-10-31 NOTE — Telephone Encounter (Signed)
Patient reports of redness noted below ILR placement that started last night. States she woke up early this morning and redness has increased in size. No drainage, fever or chills. Requested patient send picture to email. States she will send.

## 2020-11-05 DIAGNOSIS — H6123 Impacted cerumen, bilateral: Secondary | ICD-10-CM | POA: Diagnosis not present

## 2020-11-07 ENCOUNTER — Ambulatory Visit (INDEPENDENT_AMBULATORY_CARE_PROVIDER_SITE_OTHER): Payer: Medicare Other | Admitting: Internal Medicine

## 2020-11-07 ENCOUNTER — Other Ambulatory Visit: Payer: Self-pay

## 2020-11-07 ENCOUNTER — Encounter: Payer: Self-pay | Admitting: Internal Medicine

## 2020-11-07 VITALS — BP 150/74 | HR 71 | Ht 66.5 in | Wt 138.6 lb

## 2020-11-07 DIAGNOSIS — I639 Cerebral infarction, unspecified: Secondary | ICD-10-CM

## 2020-11-07 DIAGNOSIS — L03818 Cellulitis of other sites: Secondary | ICD-10-CM

## 2020-11-07 MED ORDER — CLINDAMYCIN HCL 300 MG PO CAPS: 300.0000 mg | ORAL_CAPSULE | Freq: Three times a day (TID) | ORAL | 0 refills | Status: DC

## 2020-11-07 NOTE — Progress Notes (Signed)
PCP: Lujean Amel, MD   Primary EP: Dr Rayann Heman  Gwendolyn Morrow is a 80 y.o. female who presents today for electrophysiology followup.   She presented 7 days ago to the device clinic with marked cellulitis around her loop site.  She was placed on clindamycin and has had marked improvement.  Past Medical History:  Diagnosis Date  . Arthritis   . Complication of anesthesia   . GERD (gastroesophageal reflux disease)   . H/O seasonal allergies   . Headache   . Hypertension   . PONV (postoperative nausea and vomiting)    Past Surgical History:  Procedure Laterality Date  . ABDOMINAL HYSTERECTOMY    . APPENDECTOMY     removed during C-section  . BREAST SURGERY     right breast biopsy- benign  . CESAREAN SECTION    . HAND SURGERY     bilateral thumb joints replaced  . LOOP RECORDER INSERTION N/A 10/06/2020   Procedure: LOOP RECORDER INSERTION;  Surgeon: Thompson Grayer, MD;  Location: [facility];  Service: Cardiovascular;  Laterality: N/A;  . TOTAL KNEE ARTHROPLASTY Right 09/24/2016   Procedure: TOTAL KNEE ARTHROPLASTY;  Surgeon: Paralee Cancel, MD;  Location: [facility];  Service: Orthopedics;  Laterality: Right;  . TUBAL LIGATION      ROS- all systems are reviewed and negatives except as per HPI above  Current Outpatient Medications  Medication Sig Dispense Refill  . acyclovir (ZOVIRAX) 400 MG tablet Take 400 mg by mouth 3 (three) times daily as needed (fever blisters).     Marland Kitchen amLODipine (NORVASC) 10 MG tablet Take 10 mg by mouth daily.    . Ascorbic Acid (VITAMIN C) 1000 MG tablet Take 1,000 mg by mouth every evening.    Marland Kitchen atorvastatin (LIPITOR) 40 MG tablet Take 1 tablet (40 mg total) by mouth daily at 6 PM. 60 tablet 2  . BIOTIN PO Take 15 mg by mouth daily.    . calcium carbonate (OSCAL) 1500 (600 Ca) MG TABS tablet Take 600 mg of elemental calcium by mouth daily with breakfast.    . clindamycin (CLEOCIN) 300 MG capsule Take 1 capsule (300 mg total) by mouth 3 (three) times  daily for 7 days. 21 capsule 0  . clopidogrel (PLAVIX) 75 MG tablet Take 1 tablet (75 mg total) by mouth daily. 30 tablet 11  . ferrous sulfate 325 (65 FE) MG tablet Take 1 tablet (325 mg total) by mouth 3 (three) times daily after meals.  3  . fexofenadine (ALLEGRA) 180 MG tablet Take 180 mg by mouth daily as needed for allergies.    . Multiple Vitamin (MULTIVITAMIN WITH MINERALS) TABS tablet Take 1 tablet by mouth daily.    . OMEGA-3 KRILL OIL PO Take 350 mg by mouth every evening.    Marland Kitchen omeprazole (PRILOSEC) 20 MG capsule Take 20 mg by mouth daily.    . potassium chloride SA (KLOR-CON) 20 MEQ tablet Take 20 mEq by mouth daily.    Marland Kitchen triamterene-hydrochlorothiazide (MAXZIDE-25) 37.5-25 MG tablet Take 1 tablet by mouth daily.    . Turmeric 500 MG CAPS Take 500 mg by mouth every evening.    . vitamin B-12 (CYANOCOBALAMIN) 500 MCG tablet Take 500 mcg by mouth 2 (two) times a week. Take on Mon & Fri     No current facility-administered medications for this visit.    Physical Exam: Vitals:   11/07/20 1240  BP: (!) 150/74  Pulse: 71  SpO2: 95%  Weight: 138 lb 9.6  oz (62.9 kg)  Height: 5' 6.5" (1.689 m)    GEN- The patient is well appearing, alert and oriented x 3 today.   Head- normocephalic, atraumatic Eyes-  Sclera clear, conjunctiva pink Ears- hearing intact Oropharynx- clear Lungs-  normal work of breathing Heart- Regular rate and rhythm  Skin- cellulitis is resolve,  Device implant site is mildly tender  Wt Readings from Last 3 Encounters:  11/07/20 138 lb 9.6 oz (62.9 kg)  10/04/20 135 lb (61.2 kg)  09/24/16 141 lb 14.8 oz (64.4 kg)    EKG tracing ordered today is personally reviewed and shows sinus  Assessment and Plan:  1. Cryptogenic stroke She has ILR No afib detected  2. Cellulitis Much improved/ nearly resolved Continue clindamycin 300mg  TID to complete 10 day course  Return in 3 months   Thompson Grayer MD, [facility] 11/07/2020 12:54 PM

## 2020-11-07 NOTE — Patient Instructions (Addendum)
Medication Instructions:  Your physician recommends that you continue on your current medications as directed. Please refer to the Current Medication list given to you today.  Labwork: None ordered.  Testing/Procedures: None ordered.  Follow-Up: Your physician wants you to follow-up in: 11/30/20 at 2:45 pm with Dr. Rayann Heman.  Any Other Special Instructions Will Be Listed Below (If Applicable).  If you need a refill on your cardiac medications before your next appointment, please call your pharmacy.

## 2020-11-10 ENCOUNTER — Ambulatory Visit (INDEPENDENT_AMBULATORY_CARE_PROVIDER_SITE_OTHER): Payer: Medicare Other | Admitting: Adult Health

## 2020-11-10 ENCOUNTER — Encounter: Payer: Self-pay | Admitting: Adult Health

## 2020-11-10 ENCOUNTER — Other Ambulatory Visit: Payer: Self-pay

## 2020-11-10 VITALS — BP 133/80 | HR 79 | Ht 65.0 in | Wt 140.0 lb

## 2020-11-10 DIAGNOSIS — I639 Cerebral infarction, unspecified: Secondary | ICD-10-CM

## 2020-11-10 NOTE — Progress Notes (Signed)
[facility] [address] [place]. [place] [address] [phone]       HOSPITAL FOLLOW UP NOTE  [name] Date of Birth:  May 01, 1941 Medical Record Number:  [id number]   Reason for Referral:  hospital stroke follow up    SUBJECTIVE:   CHIEF COMPLAINT:  Chief Complaint  Patient presents with  . Follow-up    TR alone Pt is well, having some minor cognitive issues but overall well.    HPI:   Gwendolyn L Leeis a 80 y.o.femalePMHx HTN and HLD who presented to [facility] ED on 10/04/2020 with right sided numbness and right arm weakness with difficulty concentrating. She was evaluated by [facility] and was recommended for transfer to [facility] where she was evaluated by Dr. Leonie Man.  Personally reviewed hospitalization pertinent progress notes, lab work and imaging with summary provided.  Stroke work-up revealed left PCA distribution infarct, likely embolic secondary to unknown source.  Loop recorder placed for long-term monitoring of possible atrial fibrillation and stroke etiology.  Recommended DAPT for 3 weeks and Plavix alone due to questionable aspirin allergy.  HTN stable.  LDL 90 and increase home atorvastatin from 20 mg to 40 mg daily.  Other stroke risk factors include advanced age and CAD but no prior stroke history.  She was discharged home in stable condition without therapy needs.  STROKE: Left PCA distribution infarct likely embolic with unknown source likely cryptogenic   CTA head : Severe stenosis within the proximal P2 segment of the left posterior cerebral artery.  CTA neck:The common and internal carotid arteries are patent within the neck without hemodynamically significant stenosis. Mildatherosclerotic plaque within both carotid systems as described. Nonspecific tortuosity and ectasia of the mid to distal cervical left ICA, which measures up to 8 mm in diameter.  MRI brain: Patchy small volume acute to early subacute left PCA distribution infarct as above.    2D Echo: EF 65%  S/p loop recorder placement  No antithrombotic PTA.  Recommended DAPT for 3 weeks then Plavix alone due to possible aspirin allergy  LDL 90  HgbA1c 5.3  VTE prophylaxis is recommended  Therapy recommendations:  No needs  Disposition:  Home   Today, 11/10/2020, Gwendolyn Morrow is being seen for hospital follow-up unaccompanied.  She does report mild cognitive issues such as delayed word finding or delayed recall but no residual weakness or numbness Denies new stroke/TIA symptoms  Completed 3 weeks DAPT and remains on Plavix alone without bleeding or bruising - questions ongoing use of Plavix versus aspirin Remains on atorvastatin 40 mg daily without myalgias Blood pressure today 133/80 - monitors at home which has been stable  Loop recorder has not shown atrial fibrillation thus far She is currently being treated for cellulitis around her ILR site - per pt, this has been slowly healing currently being monitored and managed by Dr. Rayann Heman.  She is hopeful ILR will be able to stay in and not need to be removed   ROS:   14 system review of systems performed and negative with exception of see HPI  PMH:  Past Medical History:  Diagnosis Date  . Arthritis   . Complication of anesthesia   . GERD (gastroesophageal reflux disease)   . H/O seasonal allergies   . Headache   . Hypertension   . PONV (postoperative nausea and vomiting)     PSH:  Past Surgical History:  Procedure Laterality Date  . ABDOMINAL HYSTERECTOMY    . APPENDECTOMY  removed during C-section  . BREAST SURGERY     right breast biopsy- benign  . CESAREAN SECTION    . HAND SURGERY     bilateral thumb joints replaced  . LOOP RECORDER INSERTION N/A 10/06/2020   Procedure: LOOP RECORDER INSERTION;  Surgeon: Thompson Grayer, MD;  Location: [facility];  Service: Cardiovascular;  Laterality: N/A;  . TOTAL KNEE ARTHROPLASTY Right 09/24/2016   Procedure: TOTAL KNEE ARTHROPLASTY;  Surgeon: Paralee Cancel, MD;  Location: [facility];  Service: Orthopedics;  Laterality: Right;  . TUBAL LIGATION      Social History:  Social History   Socioeconomic History  . Marital status: Widowed    Spouse name: Not on file  . Number of children: Not on file  . Years of education: Not on file  . Highest education level: Not on file  Occupational History  . Not on file  Tobacco Use  . Smoking status: Never Smoker  . Smokeless tobacco: Never Used  Substance and Sexual Activity  . Alcohol use: Yes    Comment: rarely  . Drug use: No  . Sexual activity: Not on file  Other Topics Concern  . Not on file  Social History Narrative  . Not on file   Social Determinants of Health   Financial Resource Strain: Not on file  Food Insecurity: Not on file  Transportation Needs: Not on file  Physical Activity: Not on file  Stress: Not on file  Social Connections: Not on file  Intimate Partner Violence: Not on file    Family History: History reviewed. No pertinent family history.  Medications:   Current Outpatient Medications on File Prior to Visit  Medication Sig Dispense Refill  . acyclovir (ZOVIRAX) 400 MG tablet Take 400 mg by mouth 3 (three) times daily as needed (fever blisters).     Marland Kitchen amLODipine (NORVASC) 10 MG tablet Take 10 mg by mouth daily.    . Ascorbic Acid (VITAMIN C) 1000 MG tablet Take 1,000 mg by mouth every evening.    Marland Kitchen atorvastatin (LIPITOR) 40 MG tablet Take 1 tablet (40 mg total) by mouth daily at 6 PM. 60 tablet 2  . BIOTIN PO Take 15 mg by mouth daily.    . calcium carbonate (OSCAL) 1500 (600 Ca) MG TABS tablet Take 600 mg of elemental calcium by mouth daily with breakfast.    . clopidogrel (PLAVIX) 75 MG tablet Take 1 tablet (75 mg total) by mouth daily. 30 tablet 11  . ferrous sulfate 325 (65 FE) MG tablet Take 1 tablet (325 mg total) by mouth 3 (three) times daily after meals.  3  . fexofenadine (ALLEGRA) 180 MG tablet Take 180 mg by mouth daily as needed for allergies.     . Multiple Vitamin (MULTIVITAMIN WITH MINERALS) TABS tablet Take 1 tablet by mouth daily.    . OMEGA-3 KRILL OIL PO Take 350 mg by mouth every evening.    Marland Kitchen omeprazole (PRILOSEC) 20 MG capsule Take 20 mg by mouth daily.    . potassium chloride SA (KLOR-CON) 20 MEQ tablet Take 20 mEq by mouth daily.    Marland Kitchen triamterene-hydrochlorothiazide (MAXZIDE-25) 37.5-25 MG tablet Take 1 tablet by mouth daily.    . Turmeric 500 MG CAPS Take 500 mg by mouth every evening.    . vitamin B-12 (CYANOCOBALAMIN) 500 MCG tablet Take 500 mcg by mouth 2 (two) times a week. Take on Mon & Fri     No current facility-administered medications on file prior to  visit.    Allergies:   Allergies  Allergen Reactions  . Penicillins Anaphylaxis    Has patient had a PCN reaction causing immediate rash, facial/tongue/throat swelling, SOB or lightheadedness with hypotension: Yes Has patient had a PCN reaction causing severe rash involving mucus membranes or skin necrosis: No Has patient had a PCN reaction that required hospitalization Yes Has patient had a PCN reaction occurring within the last 10 years: No If all of the above answers are "NO", then may proceed with Cephalosporin use.   . Enalapril Cough  . Excedrin Extra Strength [Asa-Apap-Caff Buffered] Hives  . Excedrin Tension Headache [Acetaminophen-Caffeine] Hives  . Tyloxapol Hives      OBJECTIVE:  Physical Exam  Vitals:   11/10/20 1056  BP: 133/80  Pulse: 79  Weight: 140 lb (63.5 kg)  Height: 5\' 5"  (1.651 m)   Body mass index is 23.3 kg/m. No exam data present  General: well developed, well nourished,  very pleasant elderly Caucasian female, seated, in no evident distress Head: head normocephalic and atraumatic.   Neck: supple with no carotid or supraclavicular bruits Cardiovascular: regular rate and rhythm, no murmurs Musculoskeletal: no deformity Skin:   Very slight inflammation around implant site but no evidence of residual  cellulitis Vascular:  Normal pulses all extremities   Neurologic Exam Mental Status: Awake and fully alert.   Fluent speech and language.  Subjective delayed recall.  Oriented to place and time. Recent and remote memory intact. Attention span, concentration and fund of knowledge appropriate during visit. Mood and affect appropriate.  Cranial Nerves: Fundoscopic exam reveals sharp disc margins. Pupils equal, briskly reactive to light. Extraocular movements full without nystagmus. Visual fields full to confrontation. Hearing intact. Facial sensation intact. Face, tongue, palate moves normally and symmetrically.  Motor: Normal bulk and tone. Normal strength in all tested extremity muscles Sensory.: intact to touch , pinprick , position and vibratory sensation.  Coordination: Rapid alternating movements normal in all extremities. Finger-to-nose and heel-to-shin performed accurately bilaterally. Gait and Station: Arises from chair without difficulty. Stance is normal. Gait demonstrates normal stride length and balance without use of assistive device Reflexes: 1+ and symmetric. Toes downgoing.     NIHSS  0 Modified Rankin  0      ASSESSMENT: Gwendolyn Morrow is a 80 y.o. year old female presented with right-sided numbness, right arm weakness and difficulty concentrating on 10/04/2020 with stroke work-up revealing left PCA distribution infarct likely embolic secondary to unknown source s/p ILR. Vascular risk factors include HTN, HLD, advanced age and CAD.      PLAN:  1. L PCA stroke, cryptogenic:  a. Recovered well with only residual delayed recall or word finding which has been slowly improving.   b. Loop recorder has not shown atrial fibrillation thus far.  Cellulitis around the implant site improving.  Discussed possibility of potential removal if this is not fully resolve but hopefully removal will not be needed or this can be reimplanted once able in the future -she plans on continued follow-up  with Dr. Rayann Heman c. Continue clopidogrel 75 mg daily  and atorvastatin 40 mg daily for secondary stroke prevention.  Remains on Plavix due to questionable aspirin allergy.  Discussed secondary stroke prevention measures and importance of close PCP follow up for aggressive stroke risk factor management  2. HTN: BP goal <130/90.  Well-controlled on current regimen per PCP 3. HLD: LDL goal <70. Recent LDL 90 therefore increase atorvastatin 20 mg daily to 40 mg daily during admission.  Encouraged continued increased dosage and request follow-up with PCP for monitoring of lipid panel and prescribing of statin    Follow up in 4 months or call earlier if needed   I spent 45 minutes of face-to-face and non-face-to-face time with patient.  This included previsit chart review, lab review, study review, order entry, electronic health record documentation, patient education regarding recent stroke and etiology, importance of managing stroke risk factors and answered all questions to patient satisfaction   Frann Rider, [facility]  [facility] [address] [place], [place] [address]  Phone [phone] Fax [phone] Note: This document was prepared with digital dictation and possible smart phrase technology. Any transcriptional errors that result from this process are unintentional.

## 2020-11-10 NOTE — Patient Instructions (Signed)
Continue clopidogrel 75 mg daily  and atorvastatin for secondary stroke prevention  Loop recorder will continue to be monitored by cardiology  Continue to follow up with PCP regarding cholesterol and blood pressure management  Maintain strict control of hypertension with blood pressure goal below 130/90 and cholesterol with LDL cholesterol (bad cholesterol) goal below 70 mg/dL.       Followup in the future with me in 4 months or call earlier if needed       Thank you for coming to see [place] at [facility]. I hope we have been able to provide you high quality care today.  You may receive a patient satisfaction survey over the next few weeks. We would appreciate your feedback and comments so that we may continue to improve ourselves and the health of our patients.

## 2020-11-15 ENCOUNTER — Ambulatory Visit (INDEPENDENT_AMBULATORY_CARE_PROVIDER_SITE_OTHER): Payer: Medicare Other

## 2020-11-15 DIAGNOSIS — I639 Cerebral infarction, unspecified: Secondary | ICD-10-CM

## 2020-11-15 LAB — CUP PACEART REMOTE DEVICE CHECK
Date Time Interrogation Session: 20220301091026
Implantable Pulse Generator Implant Date: 20220120

## 2020-11-15 NOTE — Progress Notes (Signed)
I agree with the above plan 

## 2020-11-17 DIAGNOSIS — M1712 Unilateral primary osteoarthritis, left knee: Secondary | ICD-10-CM | POA: Diagnosis not present

## 2020-11-23 NOTE — Progress Notes (Signed)
Carelink Summary Report / Loop Recorder 

## 2020-11-24 ENCOUNTER — Encounter: Payer: Self-pay | Admitting: Adult Health

## 2020-11-24 DIAGNOSIS — M1712 Unilateral primary osteoarthritis, left knee: Secondary | ICD-10-CM | POA: Diagnosis not present

## 2020-11-24 NOTE — Telephone Encounter (Signed)
Low suspicion due to plavix as she has been on for 3 months. I would advise her to f/u with PCP for further evaluation

## 2020-11-30 ENCOUNTER — Encounter: Payer: Self-pay | Admitting: Internal Medicine

## 2020-11-30 ENCOUNTER — Ambulatory Visit (INDEPENDENT_AMBULATORY_CARE_PROVIDER_SITE_OTHER): Payer: Medicare Other | Admitting: Internal Medicine

## 2020-11-30 ENCOUNTER — Other Ambulatory Visit: Payer: Self-pay

## 2020-11-30 VITALS — BP 156/72 | HR 85 | Ht 65.0 in | Wt 141.4 lb

## 2020-11-30 DIAGNOSIS — L03818 Cellulitis of other sites: Secondary | ICD-10-CM

## 2020-11-30 DIAGNOSIS — I10 Essential (primary) hypertension: Secondary | ICD-10-CM

## 2020-11-30 DIAGNOSIS — I639 Cerebral infarction, unspecified: Secondary | ICD-10-CM | POA: Diagnosis not present

## 2020-11-30 NOTE — Patient Instructions (Signed)
Medication Instructions:  Your physician recommends that you continue on your current medications as directed. Please refer to the Current Medication list given to you today.   *If you need a refill on your cardiac medications before your next appointment, please call your pharmacy*   Lab Work: None   If you have labs (blood work) drawn today and your tests are completely normal, you will receive your results only by: Marland Kitchen MyChart Message (if you have MyChart) OR . A paper copy in the mail If you have any lab test that is abnormal or we need to change your treatment, we will call you to review the results.   Testing/Procedures: None  Follow-Up: AS NEEDED   Other Instructions None

## 2020-11-30 NOTE — Progress Notes (Signed)
PCP: Lujean Amel, MD   Primary EP: Dr Rayann Heman  Gwendolyn Morrow is a 80 y.o. female who presents today for routine electrophysiology followup.  Since last being seen in our clinic, the patient reports doing very well.  Today, she denies symptoms of palpitations, chest pain, shortness of breath,  lower extremity edema, dizziness, presyncope, or syncope.  The patient is otherwise without complaint today.   Past Medical History:  Diagnosis Date  . Arthritis   . Complication of anesthesia   . GERD (gastroesophageal reflux disease)   . H/O seasonal allergies   . Headache   . Hypertension   . PONV (postoperative nausea and vomiting)    Past Surgical History:  Procedure Laterality Date  . ABDOMINAL HYSTERECTOMY    . APPENDECTOMY     removed during C-section  . BREAST SURGERY     right breast biopsy- benign  . CESAREAN SECTION    . HAND SURGERY     bilateral thumb joints replaced  . LOOP RECORDER INSERTION N/A 10/06/2020   Procedure: LOOP RECORDER INSERTION;  Surgeon: Thompson Grayer, MD;  Location: [facility];  Service: Cardiovascular;  Laterality: N/A;  . TOTAL KNEE ARTHROPLASTY Right 09/24/2016   Procedure: TOTAL KNEE ARTHROPLASTY;  Surgeon: Paralee Cancel, MD;  Location: [facility];  Service: Orthopedics;  Laterality: Right;  . TUBAL LIGATION      ROS- all systems are reviewed and negatives except as per HPI above  Current Outpatient Medications  Medication Sig Dispense Refill  . acyclovir (ZOVIRAX) 400 MG tablet Take 400 mg by mouth 3 (three) times daily as needed (fever blisters).     Marland Kitchen amLODipine (NORVASC) 10 MG tablet Take 10 mg by mouth daily.    . Ascorbic Acid (VITAMIN C) 1000 MG tablet Take 1,000 mg by mouth every evening.    Marland Kitchen atorvastatin (LIPITOR) 40 MG tablet Take 1 tablet (40 mg total) by mouth daily at 6 PM. 60 tablet 2  . BIOTIN PO Take 15 mg by mouth daily.    . calcium carbonate (OSCAL) 1500 (600 Ca) MG TABS tablet Take 600 mg of elemental calcium by mouth daily  with breakfast.    . clopidogrel (PLAVIX) 75 MG tablet Take 1 tablet (75 mg total) by mouth daily. 30 tablet 11  . fexofenadine (ALLEGRA) 180 MG tablet Take 180 mg by mouth daily as needed for allergies.    . Multiple Vitamin (MULTIVITAMIN WITH MINERALS) TABS tablet Take 1 tablet by mouth daily.    . OMEGA-3 KRILL OIL PO Take 350 mg by mouth every evening.    Marland Kitchen omeprazole (PRILOSEC) 20 MG capsule Take 20 mg by mouth daily.    . potassium chloride SA (KLOR-CON) 20 MEQ tablet Take 20 mEq by mouth daily.    Marland Kitchen triamterene-hydrochlorothiazide (MAXZIDE-25) 37.5-25 MG tablet Take 1 tablet by mouth daily.    . Turmeric 500 MG CAPS Take 500 mg by mouth every evening.    . vitamin B-12 (CYANOCOBALAMIN) 500 MCG tablet Take 500 mcg by mouth 2 (two) times a week. Take on Mon & Fri     No current facility-administered medications for this visit.    Physical Exam: Vitals:   11/30/20 1450  BP: (!) 156/72  Pulse: 85  SpO2: 94%  Weight: 141 lb 6.4 oz (64.1 kg)  Height: 5\' 5"  (1.651 m)    GEN- The patient is well appearing, alert and oriented x 3 today.   Head- normocephalic, atraumatic Eyes-  Sclera clear, conjunctiva pink  Ears- hearing intact Oropharynx- clear Lungs- Clear to ausculation bilaterally, normal work of breathing Heart- Regular rate and rhythm, no murmurs, rubs or gallops, PMI not laterally displaced GI- soft, NT, ND, + BS Extremities- no clubbing, cyanosis, or edema  Wt Readings from Last 3 Encounters:  11/30/20 141 lb 6.4 oz (64.1 kg)  11/10/20 140 lb (63.5 kg)  11/07/20 138 lb 9.6 oz (62.9 kg)    EKG tracing ordered today is personally reviewed and shows sinus with PVCs  Assessment and Plan:  1. Cryptogenic stroke No afib by ILR Continue to monitor  2. Chest cellulitis (at ILR site) Resolved with 10 day coarse of clindamycin  3. HTN Stable No change required today   Return as needed   Thompson Grayer MD, [facility] 11/30/2020 2:57 PM

## 2020-12-01 DIAGNOSIS — M1712 Unilateral primary osteoarthritis, left knee: Secondary | ICD-10-CM | POA: Diagnosis not present

## 2020-12-07 DIAGNOSIS — M1712 Unilateral primary osteoarthritis, left knee: Secondary | ICD-10-CM | POA: Diagnosis not present

## 2020-12-15 DIAGNOSIS — M1712 Unilateral primary osteoarthritis, left knee: Secondary | ICD-10-CM | POA: Diagnosis not present

## 2020-12-16 DIAGNOSIS — Z23 Encounter for immunization: Secondary | ICD-10-CM | POA: Diagnosis not present

## 2020-12-19 ENCOUNTER — Ambulatory Visit (INDEPENDENT_AMBULATORY_CARE_PROVIDER_SITE_OTHER): Payer: Medicare Other

## 2020-12-19 DIAGNOSIS — I639 Cerebral infarction, unspecified: Secondary | ICD-10-CM

## 2020-12-20 LAB — CUP PACEART REMOTE DEVICE CHECK
Date Time Interrogation Session: 20220403091239
Implantable Pulse Generator Implant Date: 20220120

## 2020-12-23 DIAGNOSIS — N390 Urinary tract infection, site not specified: Secondary | ICD-10-CM | POA: Diagnosis not present

## 2020-12-29 NOTE — Progress Notes (Signed)
Carelink Summary Report / Loop Recorder 

## 2021-01-03 ENCOUNTER — Encounter: Payer: Self-pay | Admitting: Adult Health

## 2021-01-19 DIAGNOSIS — M25511 Pain in right shoulder: Secondary | ICD-10-CM | POA: Diagnosis not present

## 2021-01-19 DIAGNOSIS — Z8673 Personal history of transient ischemic attack (TIA), and cerebral infarction without residual deficits: Secondary | ICD-10-CM | POA: Diagnosis not present

## 2021-01-19 DIAGNOSIS — M542 Cervicalgia: Secondary | ICD-10-CM | POA: Diagnosis not present

## 2021-01-19 DIAGNOSIS — M25512 Pain in left shoulder: Secondary | ICD-10-CM | POA: Diagnosis not present

## 2021-01-20 LAB — CUP PACEART REMOTE DEVICE CHECK
Date Time Interrogation Session: 20220506091157
Implantable Pulse Generator Implant Date: 20220120

## 2021-01-23 ENCOUNTER — Ambulatory Visit (INDEPENDENT_AMBULATORY_CARE_PROVIDER_SITE_OTHER): Payer: Medicare Other

## 2021-01-23 DIAGNOSIS — I639 Cerebral infarction, unspecified: Secondary | ICD-10-CM | POA: Diagnosis not present

## 2021-01-27 DIAGNOSIS — K219 Gastro-esophageal reflux disease without esophagitis: Secondary | ICD-10-CM | POA: Diagnosis not present

## 2021-01-27 DIAGNOSIS — D81818 Other biotin-dependent carboxylase deficiency: Secondary | ICD-10-CM | POA: Diagnosis not present

## 2021-01-27 DIAGNOSIS — I10 Essential (primary) hypertension: Secondary | ICD-10-CM | POA: Diagnosis not present

## 2021-01-27 DIAGNOSIS — E559 Vitamin D deficiency, unspecified: Secondary | ICD-10-CM | POA: Diagnosis not present

## 2021-01-27 DIAGNOSIS — Z8673 Personal history of transient ischemic attack (TIA), and cerebral infarction without residual deficits: Secondary | ICD-10-CM | POA: Diagnosis not present

## 2021-01-27 DIAGNOSIS — M160 Bilateral primary osteoarthritis of hip: Secondary | ICD-10-CM | POA: Diagnosis not present

## 2021-01-27 DIAGNOSIS — Z96651 Presence of right artificial knee joint: Secondary | ICD-10-CM | POA: Diagnosis not present

## 2021-01-27 DIAGNOSIS — E7800 Pure hypercholesterolemia, unspecified: Secondary | ICD-10-CM | POA: Diagnosis not present

## 2021-01-27 DIAGNOSIS — Z0001 Encounter for general adult medical examination with abnormal findings: Secondary | ICD-10-CM | POA: Diagnosis not present

## 2021-01-27 DIAGNOSIS — R300 Dysuria: Secondary | ICD-10-CM | POA: Diagnosis not present

## 2021-01-27 DIAGNOSIS — M1611 Unilateral primary osteoarthritis, right hip: Secondary | ICD-10-CM | POA: Diagnosis not present

## 2021-01-27 DIAGNOSIS — Z79899 Other long term (current) drug therapy: Secondary | ICD-10-CM | POA: Diagnosis not present

## 2021-02-01 DIAGNOSIS — M25551 Pain in right hip: Secondary | ICD-10-CM | POA: Diagnosis not present

## 2021-02-14 NOTE — Progress Notes (Signed)
Carelink Summary Report / Loop Recorder 

## 2021-02-23 LAB — CUP PACEART REMOTE DEVICE CHECK
Date Time Interrogation Session: 20220608090859
Implantable Pulse Generator Implant Date: 20220120

## 2021-02-27 ENCOUNTER — Ambulatory Visit (INDEPENDENT_AMBULATORY_CARE_PROVIDER_SITE_OTHER): Payer: Medicare Other

## 2021-02-27 DIAGNOSIS — I639 Cerebral infarction, unspecified: Secondary | ICD-10-CM

## 2021-03-10 DIAGNOSIS — N390 Urinary tract infection, site not specified: Secondary | ICD-10-CM | POA: Diagnosis not present

## 2021-03-10 DIAGNOSIS — J019 Acute sinusitis, unspecified: Secondary | ICD-10-CM | POA: Diagnosis not present

## 2021-03-14 ENCOUNTER — Ambulatory Visit (INDEPENDENT_AMBULATORY_CARE_PROVIDER_SITE_OTHER): Payer: Medicare Other | Admitting: Adult Health

## 2021-03-14 ENCOUNTER — Encounter: Payer: Self-pay | Admitting: Adult Health

## 2021-03-14 VITALS — BP 139/77 | HR 80 | Ht 65.0 in | Wt 140.0 lb

## 2021-03-14 DIAGNOSIS — I10 Essential (primary) hypertension: Secondary | ICD-10-CM | POA: Diagnosis not present

## 2021-03-14 DIAGNOSIS — I639 Cerebral infarction, unspecified: Secondary | ICD-10-CM

## 2021-03-14 DIAGNOSIS — E785 Hyperlipidemia, unspecified: Secondary | ICD-10-CM | POA: Diagnosis not present

## 2021-03-14 MED ORDER — ATORVASTATIN CALCIUM 80 MG PO TABS: 80.0000 mg | ORAL_TABLET | Freq: Every day | ORAL | 11 refills | Status: DC

## 2021-03-14 MED ORDER — PANTOPRAZOLE SODIUM 40 MG PO TBEC: 40.0000 mg | DELAYED_RELEASE_TABLET | Freq: Every day | ORAL | 11 refills | Status: AC

## 2021-03-14 NOTE — Patient Instructions (Signed)
Continue clopidogrel 75 mg daily  and Increase atorvastatin to 80 mg daily  for secondary stroke prevention  Plan on repeating cholesterol levels around 8/30  Due to possible interaction between omeprazole and Plavix, recommend switching omeprazole to pantoprazole  Continue to follow up with PCP regarding cholesterol and blood pressure management  Maintain strict control of hypertension with blood pressure goal below 130/90 and cholesterol with LDL cholesterol (bad cholesterol) goal below 70 mg/dL.       Followup in the future with me in 6 months or call earlier if needed       Thank you for coming to see [place] at [facility]. I hope we have been able to provide you high quality care today.  You may receive a patient satisfaction survey over the next few weeks. We would appreciate your feedback and comments so that we may continue to improve ourselves and the health of our patients.

## 2021-03-14 NOTE — Progress Notes (Addendum)
[facility] [address] [place]. [place] [address] [phone]       STROKE FOLLOW UP NOTE  [name] Date of Birth:  17-Jul-1941 Medical Record Number:  [id number]   Reason for Referral: stroke follow up    SUBJECTIVE:   CHIEF COMPLAINT:  Chief Complaint  Patient presents with   Follow-up    RM 14 alone Pt is well, cognition has improved, doing much better      HPI:   Gwendolyn Morrow returns for 8-month stroke follow-up unaccompanied.  She has been doing well since prior visit with improvement of cognition and memory and word finding difficulties.  Denies new stroke/TIA symptoms.  Remains on Plavix and atorvastatin without associated side effects.  Blood pressure today 139/77.  Loop recorder has not shown atrial fibrillation thus far.  Prior chest cellulitis around ILR site resolved after antibiotic course.  No new concerns at this time.    History provided for reference purposes only Initial visit 11/10/2020: Gwendolyn Morrow is being seen for hospital follow-up unaccompanied.  She does report mild cognitive issues such as delayed word finding or delayed recall but no residual weakness or numbness Denies new stroke/TIA symptoms  Completed 3 weeks DAPT and remains on Plavix alone without bleeding or bruising - questions ongoing use of Plavix versus aspirin Remains on atorvastatin 40 mg daily without myalgias Blood pressure today 133/80 - monitors at home which has been stable  Loop recorder has not shown atrial fibrillation thus far She is currently being treated for cellulitis around her ILR site - per pt, this has been slowly healing currently being monitored and managed by Dr. Rayann Heman.  She is hopeful ILR will be able to stay in and not need to be removed  Stroke admission 10/04/2020 Gwendolyn Morrow is a 80 y.o. female PMHx HTN and HLD who presented to [facility] ED on 10/04/2020 with right sided numbness and right arm weakness with difficulty concentrating. She was  evaluated by [facility] and was recommended for transfer to [facility] where she was evaluated by Dr. Leonie Man.  Personally reviewed hospitalization pertinent progress notes, lab work and imaging with summary provided.  Stroke work-up revealed left PCA distribution infarct, likely embolic secondary to unknown source.  Loop recorder placed for long-term monitoring of possible atrial fibrillation and stroke etiology.  Recommended DAPT for 3 weeks and Plavix alone due to questionable aspirin allergy.  HTN stable.  LDL 90 and increase home atorvastatin from 20 mg to 40 mg daily.  Other stroke risk factors include advanced age and CAD but no prior stroke history.  She was discharged home in stable condition without therapy needs.  STROKE: Left PCA distribution infarct likely embolic with unknown source likely cryptogenic  CTA head : Severe stenosis within the proximal P2 segment of the left posterior cerebral artery. CTA neck:The common and internal carotid arteries are patent within the neck without hemodynamically significant stenosis. Mildatherosclerotic plaque within both carotid systems as described. Nonspecific tortuosity and ectasia of the mid to distal cervical left ICA, which measures up to 8 mm in diameter. MRI brain: Patchy small volume acute to early subacute left PCA distribution infarct as above.  2D Echo: EF 65% S/p loop recorder placement No antithrombotic PTA.  Recommended DAPT for 3 weeks then Plavix alone due to possible aspirin allergy LDL 90 HgbA1c 5.3 VTE prophylaxis is recommended Therapy recommendations:  No needs Disposition:  Home     ROS:   14 system review of systems performed  and negative with exception of see HPI  PMH:  Past Medical History:  Diagnosis Date   Arthritis    Complication of anesthesia    GERD (gastroesophageal reflux disease)    H/O seasonal allergies    Headache    Hypertension    PONV (postoperative nausea and vomiting)     PSH:  Past Surgical  History:  Procedure Laterality Date   ABDOMINAL HYSTERECTOMY     APPENDECTOMY     removed during C-section   BREAST SURGERY     right breast biopsy- benign   CESAREAN SECTION     HAND SURGERY     bilateral thumb joints replaced   LOOP RECORDER INSERTION N/A 10/06/2020   Procedure: LOOP RECORDER INSERTION;  Surgeon: Thompson Grayer, MD;  Location: [facility];  Service: Cardiovascular;  Laterality: N/A;   TOTAL KNEE ARTHROPLASTY Right 09/24/2016   Procedure: TOTAL KNEE ARTHROPLASTY;  Surgeon: Paralee Cancel, MD;  Location: [facility];  Service: Orthopedics;  Laterality: Right;   TUBAL LIGATION      Social History:  Social History   Socioeconomic History   Marital status: Widowed    Spouse name: Not on file   Number of children: Not on file   Years of education: Not on file   Highest education level: Not on file  Occupational History   Not on file  Tobacco Use   Smoking status: Never   Smokeless tobacco: Never  Vaping Use   Vaping Use: Never used  Substance and Sexual Activity   Alcohol use: Yes    Comment: rarely   Drug use: No   Sexual activity: Not on file  Other Topics Concern   Not on file  Social History Narrative   Not on file   Social Determinants of Health   Financial Resource Strain: Not on file  Food Insecurity: Not on file  Transportation Needs: Not on file  Physical Activity: Not on file  Stress: Not on file  Social Connections: Not on file  Intimate Partner Violence: Not on file    Family History: No family history on file.  Medications:   Current Outpatient Medications on File Prior to Visit  Medication Sig Dispense Refill   acyclovir (ZOVIRAX) 400 MG tablet Take 400 mg by mouth 3 (three) times daily as needed (fever blisters).      amLODipine (NORVASC) 10 MG tablet Take 10 mg by mouth daily.     Ascorbic Acid (VITAMIN C) 1000 MG tablet Take 1,000 mg by mouth every evening.     atorvastatin (LIPITOR) 40 MG tablet Take 1 tablet (40 mg total) by  mouth daily at 6 PM. 60 tablet 2   BIOTIN PO Take 15 mg by mouth daily.     calcium carbonate (OSCAL) 1500 (600 Ca) MG TABS tablet Take 600 mg of elemental calcium by mouth daily with breakfast.     clopidogrel (PLAVIX) 75 MG tablet Take 1 tablet (75 mg total) by mouth daily. 30 tablet 11   Loratadine (CLARITIN) 10 MG CAPS Take by mouth.     Multiple Vitamin (MULTIVITAMIN WITH MINERALS) TABS tablet Take 1 tablet by mouth daily.     nitrofurantoin (MACRODANTIN) 25 MG capsule Take by mouth.     OMEGA-3 KRILL OIL PO Take 350 mg by mouth every evening.     omeprazole (PRILOSEC) 40 MG capsule Take 20 mg by mouth daily.     potassium chloride SA (KLOR-CON) 20 MEQ tablet Take 20 mEq by mouth daily.  triamterene-hydrochlorothiazide (MAXZIDE-25) 37.5-25 MG tablet Take 1 tablet by mouth daily.     Turmeric 500 MG CAPS Take 500 mg by mouth every evening.     vitamin B-12 (CYANOCOBALAMIN) 500 MCG tablet Take 500 mcg by mouth 2 (two) times a week. Take on Mon & Fri     No current facility-administered medications on file prior to visit.    Allergies:   Allergies  Allergen Reactions   Penicillins Anaphylaxis    Has patient had a PCN reaction causing immediate rash, facial/tongue/throat swelling, SOB or lightheadedness with hypotension: Yes Has patient had a PCN reaction causing severe rash involving mucus membranes or skin necrosis: No Has patient had a PCN reaction that required hospitalization Yes Has patient had a PCN reaction occurring within the last 10 years: No If all of the above answers are "NO", then may proceed with Cephalosporin use.    Enalapril Cough   Excedrin Extra Strength [Asa-Apap-Caff Buffered] Hives   Excedrin Tension Headache [Acetaminophen-Caffeine] Hives   Tyloxapol Hives      OBJECTIVE:  Physical Exam  Vitals:   03/14/21 1235  BP: 139/77  Pulse: 80  Weight: 140 lb (63.5 kg)  Height: 5\' 5"  (1.651 m)   Body mass index is 23.3 kg/m. No results  found.  General: well developed, well nourished,  very pleasant elderly Caucasian female, seated, in no evident distress Head: head normocephalic and atraumatic.   Neck: supple with no carotid or supraclavicular bruits Cardiovascular: regular rate and rhythm, no murmurs Musculoskeletal: no deformity Vascular:  Normal pulses all extremities   Neurologic Exam Mental Status: Awake and fully alert.   Fluent speech and language.  Subjective delayed recall.  Oriented to place and time. Recent and remote memory intact. Attention span, concentration and fund of knowledge appropriate during visit. Mood and affect appropriate.  Cranial Nerves: Pupils equal, briskly reactive to light. Extraocular movements full without nystagmus. Visual fields full to confrontation. Hearing intact. Facial sensation intact. Face, tongue, palate moves normally and symmetrically.  Motor: Normal bulk and tone. Normal strength in all tested extremity muscles Sensory.: intact to touch , pinprick , position and vibratory sensation.  Coordination: Rapid alternating movements normal in all extremities. Finger-to-nose and heel-to-shin performed accurately bilaterally. Gait and Station: Arises from chair without difficulty. Stance is normal. Gait demonstrates normal stride length and balance without use of assistive device Reflexes: 1+ and symmetric. Toes downgoing.         ASSESSMENT: CAELYN ROUTE is a 80 y.o. year old female presented with right-sided numbness, right arm weakness and difficulty concentrating on 10/04/2020 with stroke work-up revealing left PCA distribution infarct likely embolic secondary to unknown source s/p ILR. Vascular risk factors include HTN, HLD, advanced age and CAD.      PLAN:  L PCA stroke, cryptogenic:  Recovered well with only minimal cognitive difficulties Loop recorder has not shown atrial fibrillation thus far -monthly reports personally reviewed. Hx of chest cellulitis at ILR site which  resolved with abx treatment Continue clopidogrel 75 mg daily  and atorvastatin for secondary stroke prevention.  Remains on Plavix due to questionable aspirin allergy.  Advised to switch omeprazole to pantoprazole due to interaction with plavix  Discussed secondary stroke prevention measures and importance of close PCP follow up for aggressive stroke risk factor management  HTN: BP goal <130/90.  Well-controlled on current regimen per PCP HLD: LDL goal <70. Recent LDL 93 (01/2021) on atorvastatin 40 mg daily -recommend increasing atorvastatin to 80 mg daily and repeat  lipid panel in 2 months    Follow up in 6 months or call earlier if needed   CC:  [facility] provider: Dr. Jerold Coombe, [place], MD     Frann Rider, [facility]  [facility] [address] [place], [place] [address]  Phone [phone] Fax [phone] Note: This document was prepared with digital dictation and possible smart phrase technology. Any transcriptional errors that result from this process are unintentional.

## 2021-03-17 NOTE — Progress Notes (Signed)
I agree with the above plan 

## 2021-03-21 NOTE — Progress Notes (Signed)
Carelink Summary Report / Loop Recorder 

## 2021-03-29 LAB — CUP PACEART REMOTE DEVICE CHECK
Date Time Interrogation Session: 20220711090910
Implantable Pulse Generator Implant Date: 20220120

## 2021-04-03 ENCOUNTER — Ambulatory Visit (INDEPENDENT_AMBULATORY_CARE_PROVIDER_SITE_OTHER): Payer: Medicare Other

## 2021-04-03 DIAGNOSIS — I639 Cerebral infarction, unspecified: Secondary | ICD-10-CM | POA: Diagnosis not present

## 2021-04-06 ENCOUNTER — Encounter: Payer: Self-pay | Admitting: Adult Health

## 2021-04-10 NOTE — Telephone Encounter (Signed)
40 mg is the max dose recommended for pantoprazole - if still having acid reflux, she may need to be seen by her PCP for referral to GI for further evaluation and medication management

## 2021-04-10 NOTE — Telephone Encounter (Signed)
She was last seen 03/14/21 with a yrs supplied. Do you suggest increase dosage? Or strength ?

## 2021-04-26 NOTE — Progress Notes (Signed)
Carelink Summary Report / Loop Recorder 

## 2021-05-04 LAB — CUP PACEART REMOTE DEVICE CHECK
Date Time Interrogation Session: 20220813091024
Implantable Pulse Generator Implant Date: 20220120

## 2021-05-08 ENCOUNTER — Ambulatory Visit (INDEPENDENT_AMBULATORY_CARE_PROVIDER_SITE_OTHER): Payer: Medicare Other

## 2021-05-08 DIAGNOSIS — I639 Cerebral infarction, unspecified: Secondary | ICD-10-CM | POA: Diagnosis not present

## 2021-05-16 ENCOUNTER — Other Ambulatory Visit (INDEPENDENT_AMBULATORY_CARE_PROVIDER_SITE_OTHER): Payer: Self-pay

## 2021-05-16 DIAGNOSIS — Z0289 Encounter for other administrative examinations: Secondary | ICD-10-CM

## 2021-05-16 DIAGNOSIS — E785 Hyperlipidemia, unspecified: Secondary | ICD-10-CM | POA: Diagnosis not present

## 2021-05-17 ENCOUNTER — Encounter: Payer: Self-pay | Admitting: Adult Health

## 2021-05-17 ENCOUNTER — Other Ambulatory Visit: Payer: Self-pay | Admitting: Adult Health

## 2021-05-17 LAB — LIPID PANEL
Chol/HDL Ratio: 3.3 ratio (ref 0.0–4.4)
Cholesterol, Total: 162 mg/dL (ref 100–199)
HDL: 49 mg/dL (ref >39)
LDL Chol Calc (NIH): 84 mg/dL (ref 0–99)
Triglycerides: 167 mg/dL — ABNORMAL HIGH (ref 0–149)
VLDL Cholesterol Cal: 29 mg/dL (ref 5–40)

## 2021-05-17 MED ORDER — ROSUVASTATIN CALCIUM 40 MG PO TABS: 40.0000 mg | ORAL_TABLET | Freq: Every day | ORAL | 5 refills | Status: AC

## 2021-05-17 NOTE — Progress Notes (Signed)
Please advise patient that starting on Crestor '40mg'$  daily for better cholesterol control.  Allergy list has Excedrin which has an ingredient of cornstarch which is also an inactive ingredient in Crestor. Low suspicion to have any difficulty tolerating but please advise to monitor.

## 2021-05-17 NOTE — Progress Notes (Signed)
Pt advised of note from NP. Pt verbalized appreciation for the call and will proceed with starting Crestor. She will let us know if she has any issues.

## 2021-05-24 NOTE — Progress Notes (Signed)
Carelink Summary Report / Loop Recorder 

## 2021-06-01 ENCOUNTER — Ambulatory Visit (INDEPENDENT_AMBULATORY_CARE_PROVIDER_SITE_OTHER): Payer: Medicare Other

## 2021-06-01 DIAGNOSIS — I639 Cerebral infarction, unspecified: Secondary | ICD-10-CM

## 2021-06-01 LAB — CUP PACEART REMOTE DEVICE CHECK
Date Time Interrogation Session: 20220915091125
Implantable Pulse Generator Implant Date: 20220120

## 2021-06-07 DIAGNOSIS — D3610 Benign neoplasm of peripheral nerves and autonomic nervous system, unspecified: Secondary | ICD-10-CM | POA: Diagnosis not present

## 2021-06-07 DIAGNOSIS — D0462 Carcinoma in situ of skin of left upper limb, including shoulder: Secondary | ICD-10-CM | POA: Diagnosis not present

## 2021-06-07 DIAGNOSIS — D1722 Benign lipomatous neoplasm of skin and subcutaneous tissue of left arm: Secondary | ICD-10-CM | POA: Diagnosis not present

## 2021-06-07 DIAGNOSIS — L578 Other skin changes due to chronic exposure to nonionizing radiation: Secondary | ICD-10-CM | POA: Diagnosis not present

## 2021-06-07 DIAGNOSIS — Z85828 Personal history of other malignant neoplasm of skin: Secondary | ICD-10-CM | POA: Diagnosis not present

## 2021-06-07 DIAGNOSIS — L821 Other seborrheic keratosis: Secondary | ICD-10-CM | POA: Diagnosis not present

## 2021-06-07 DIAGNOSIS — L570 Actinic keratosis: Secondary | ICD-10-CM | POA: Diagnosis not present

## 2021-06-07 DIAGNOSIS — D225 Melanocytic nevi of trunk: Secondary | ICD-10-CM | POA: Diagnosis not present

## 2021-06-07 DIAGNOSIS — D485 Neoplasm of uncertain behavior of skin: Secondary | ICD-10-CM | POA: Diagnosis not present

## 2021-06-07 DIAGNOSIS — Z86018 Personal history of other benign neoplasm: Secondary | ICD-10-CM | POA: Diagnosis not present

## 2021-06-08 NOTE — Progress Notes (Signed)
Carelink Summary Report / Loop Recorder 

## 2021-06-20 DIAGNOSIS — Z23 Encounter for immunization: Secondary | ICD-10-CM | POA: Diagnosis not present

## 2021-06-28 DIAGNOSIS — H6122 Impacted cerumen, left ear: Secondary | ICD-10-CM | POA: Diagnosis not present

## 2021-06-29 DIAGNOSIS — H938X2 Other specified disorders of left ear: Secondary | ICD-10-CM | POA: Diagnosis not present

## 2021-06-29 DIAGNOSIS — R002 Palpitations: Secondary | ICD-10-CM | POA: Diagnosis not present

## 2021-06-29 DIAGNOSIS — J309 Allergic rhinitis, unspecified: Secondary | ICD-10-CM | POA: Diagnosis not present

## 2021-07-04 ENCOUNTER — Ambulatory Visit (INDEPENDENT_AMBULATORY_CARE_PROVIDER_SITE_OTHER): Payer: Medicare Other

## 2021-07-04 DIAGNOSIS — I639 Cerebral infarction, unspecified: Secondary | ICD-10-CM | POA: Diagnosis not present

## 2021-07-06 LAB — CUP PACEART REMOTE DEVICE CHECK
Date Time Interrogation Session: 20221018091116
Implantable Pulse Generator Implant Date: 20220120

## 2021-07-13 NOTE — Progress Notes (Signed)
Carelink Summary Report / Loop Recorder 

## 2021-07-17 DIAGNOSIS — H52203 Unspecified astigmatism, bilateral: Secondary | ICD-10-CM | POA: Diagnosis not present

## 2021-07-17 DIAGNOSIS — H25813 Combined forms of age-related cataract, bilateral: Secondary | ICD-10-CM | POA: Diagnosis not present

## 2021-07-17 DIAGNOSIS — H43813 Vitreous degeneration, bilateral: Secondary | ICD-10-CM | POA: Diagnosis not present

## 2021-08-02 DIAGNOSIS — Z79899 Other long term (current) drug therapy: Secondary | ICD-10-CM | POA: Diagnosis not present

## 2021-08-02 DIAGNOSIS — N390 Urinary tract infection, site not specified: Secondary | ICD-10-CM | POA: Diagnosis not present

## 2021-08-02 DIAGNOSIS — I10 Essential (primary) hypertension: Secondary | ICD-10-CM | POA: Diagnosis not present

## 2021-08-02 DIAGNOSIS — M1712 Unilateral primary osteoarthritis, left knee: Secondary | ICD-10-CM | POA: Diagnosis not present

## 2021-08-02 DIAGNOSIS — K219 Gastro-esophageal reflux disease without esophagitis: Secondary | ICD-10-CM | POA: Diagnosis not present

## 2021-08-02 DIAGNOSIS — E7800 Pure hypercholesterolemia, unspecified: Secondary | ICD-10-CM | POA: Diagnosis not present

## 2021-08-03 DIAGNOSIS — L570 Actinic keratosis: Secondary | ICD-10-CM | POA: Diagnosis not present

## 2021-08-03 DIAGNOSIS — D0462 Carcinoma in situ of skin of left upper limb, including shoulder: Secondary | ICD-10-CM | POA: Diagnosis not present

## 2021-08-07 ENCOUNTER — Ambulatory Visit (INDEPENDENT_AMBULATORY_CARE_PROVIDER_SITE_OTHER): Payer: Medicare Other

## 2021-08-07 DIAGNOSIS — I639 Cerebral infarction, unspecified: Secondary | ICD-10-CM | POA: Diagnosis not present

## 2021-08-07 LAB — CUP PACEART REMOTE DEVICE CHECK
Date Time Interrogation Session: 20221120090959
Implantable Pulse Generator Implant Date: 20220120

## 2021-08-09 DIAGNOSIS — M1712 Unilateral primary osteoarthritis, left knee: Secondary | ICD-10-CM | POA: Diagnosis not present

## 2021-08-16 DIAGNOSIS — M1712 Unilateral primary osteoarthritis, left knee: Secondary | ICD-10-CM | POA: Diagnosis not present

## 2021-08-16 NOTE — Progress Notes (Signed)
Carelink Summary Report / Loop Recorder 

## 2021-08-21 ENCOUNTER — Encounter: Payer: Self-pay | Admitting: Internal Medicine

## 2021-08-21 DIAGNOSIS — Z1231 Encounter for screening mammogram for malignant neoplasm of breast: Secondary | ICD-10-CM | POA: Diagnosis not present

## 2021-09-08 ENCOUNTER — Ambulatory Visit (INDEPENDENT_AMBULATORY_CARE_PROVIDER_SITE_OTHER): Payer: Medicare Other

## 2021-09-08 DIAGNOSIS — I639 Cerebral infarction, unspecified: Secondary | ICD-10-CM

## 2021-09-08 LAB — CUP PACEART REMOTE DEVICE CHECK
Date Time Interrogation Session: 20221223091304
Implantable Pulse Generator Implant Date: 20220120

## 2021-09-12 ENCOUNTER — Emergency Department (HOSPITAL_COMMUNITY): Payer: No Typology Code available for payment source

## 2021-09-12 ENCOUNTER — Emergency Department (HOSPITAL_COMMUNITY)
Admission: EM | Admit: 2021-09-12 | Discharge: 2021-09-12 | Disposition: A | Discharge status: Home / Self Care | Payer: No Typology Code available for payment source | Attending: Emergency Medicine | Admitting: Emergency Medicine

## 2021-09-12 ENCOUNTER — Other Ambulatory Visit: Payer: Self-pay

## 2021-09-12 ENCOUNTER — Encounter (HOSPITAL_COMMUNITY): Payer: Self-pay

## 2021-09-12 DIAGNOSIS — Z743 Need for continuous supervision: Secondary | ICD-10-CM | POA: Diagnosis not present

## 2021-09-12 DIAGNOSIS — Z8673 Personal history of transient ischemic attack (TIA), and cerebral infarction without residual deficits: Secondary | ICD-10-CM | POA: Insufficient documentation

## 2021-09-12 DIAGNOSIS — Y92481 Parking lot as the place of occurrence of the external cause: Secondary | ICD-10-CM | POA: Diagnosis not present

## 2021-09-12 DIAGNOSIS — S29002A Unspecified injury of muscle and tendon of back wall of thorax, initial encounter: Secondary | ICD-10-CM | POA: Diagnosis not present

## 2021-09-12 DIAGNOSIS — D7389 Other diseases of spleen: Secondary | ICD-10-CM | POA: Diagnosis not present

## 2021-09-12 DIAGNOSIS — S0990XA Unspecified injury of head, initial encounter: Secondary | ICD-10-CM | POA: Diagnosis not present

## 2021-09-12 DIAGNOSIS — Z041 Encounter for examination and observation following transport accident: Secondary | ICD-10-CM | POA: Diagnosis not present

## 2021-09-12 DIAGNOSIS — I10 Essential (primary) hypertension: Secondary | ICD-10-CM | POA: Insufficient documentation

## 2021-09-12 DIAGNOSIS — Z7902 Long term (current) use of antithrombotics/antiplatelets: Secondary | ICD-10-CM | POA: Diagnosis not present

## 2021-09-12 DIAGNOSIS — M2578 Osteophyte, vertebrae: Secondary | ICD-10-CM | POA: Diagnosis not present

## 2021-09-12 DIAGNOSIS — R52 Pain, unspecified: Secondary | ICD-10-CM | POA: Diagnosis not present

## 2021-09-12 DIAGNOSIS — K219 Gastro-esophageal reflux disease without esophagitis: Secondary | ICD-10-CM | POA: Diagnosis not present

## 2021-09-12 DIAGNOSIS — R519 Headache, unspecified: Secondary | ICD-10-CM | POA: Insufficient documentation

## 2021-09-12 DIAGNOSIS — Z96651 Presence of right artificial knee joint: Secondary | ICD-10-CM | POA: Diagnosis not present

## 2021-09-12 DIAGNOSIS — Z79899 Other long term (current) drug therapy: Secondary | ICD-10-CM | POA: Diagnosis not present

## 2021-09-12 DIAGNOSIS — M5134 Other intervertebral disc degeneration, thoracic region: Secondary | ICD-10-CM | POA: Diagnosis not present

## 2021-09-12 DIAGNOSIS — T1490XA Injury, unspecified, initial encounter: Secondary | ICD-10-CM

## 2021-09-12 DIAGNOSIS — M549 Dorsalgia, unspecified: Secondary | ICD-10-CM | POA: Diagnosis not present

## 2021-09-12 DIAGNOSIS — M47812 Spondylosis without myelopathy or radiculopathy, cervical region: Secondary | ICD-10-CM | POA: Diagnosis not present

## 2021-09-12 DIAGNOSIS — S33140A Subluxation of L4/L5 lumbar vertebra, initial encounter: Secondary | ICD-10-CM | POA: Diagnosis not present

## 2021-09-12 DIAGNOSIS — M47816 Spondylosis without myelopathy or radiculopathy, lumbar region: Secondary | ICD-10-CM | POA: Diagnosis not present

## 2021-09-12 DIAGNOSIS — I2510 Atherosclerotic heart disease of native coronary artery without angina pectoris: Secondary | ICD-10-CM | POA: Diagnosis not present

## 2021-09-12 DIAGNOSIS — R0789 Other chest pain: Secondary | ICD-10-CM | POA: Diagnosis not present

## 2021-09-12 DIAGNOSIS — S199XXA Unspecified injury of neck, initial encounter: Secondary | ICD-10-CM | POA: Diagnosis not present

## 2021-09-12 DIAGNOSIS — Z20822 Contact with and (suspected) exposure to covid-19: Secondary | ICD-10-CM | POA: Diagnosis not present

## 2021-09-12 DIAGNOSIS — K449 Diaphragmatic hernia without obstruction or gangrene: Secondary | ICD-10-CM | POA: Insufficient documentation

## 2021-09-12 DIAGNOSIS — M4312 Spondylolisthesis, cervical region: Secondary | ICD-10-CM | POA: Diagnosis not present

## 2021-09-12 DIAGNOSIS — R0902 Hypoxemia: Secondary | ICD-10-CM | POA: Diagnosis not present

## 2021-09-12 DIAGNOSIS — R109 Unspecified abdominal pain: Secondary | ICD-10-CM | POA: Diagnosis not present

## 2021-09-12 DIAGNOSIS — M5136 Other intervertebral disc degeneration, lumbar region: Secondary | ICD-10-CM | POA: Diagnosis not present

## 2021-09-12 DIAGNOSIS — S3991XA Unspecified injury of abdomen, initial encounter: Secondary | ICD-10-CM | POA: Diagnosis not present

## 2021-09-12 DIAGNOSIS — G459 Transient cerebral ischemic attack, unspecified: Secondary | ICD-10-CM | POA: Diagnosis not present

## 2021-09-12 LAB — CBC
HCT: 44.4 % (ref 36.0–46.0)
Hemoglobin: 15.2 g/dL — ABNORMAL HIGH (ref 12.0–15.0)
MCH: 30.6 pg (ref 26.0–34.0)
MCHC: 34.2 g/dL (ref 30.0–36.0)
MCV: 89.3 fL (ref 80.0–100.0)
Platelets: 234 10*3/uL (ref 150–400)
RBC: 4.97 MIL/uL (ref 3.87–5.11)
RDW: 11.9 % (ref 11.5–15.5)
WBC: 7.8 10*3/uL (ref 4.0–10.5)
nRBC: 0 % (ref 0.0–0.2)

## 2021-09-12 LAB — I-STAT CHEM 8, ED
BUN: 19 mg/dL (ref 8–23)
Calcium, Ion: 1.17 mmol/L (ref 1.15–1.40)
Chloride: 102 mmol/L (ref 98–111)
Creatinine, Ser: 0.9 mg/dL (ref 0.44–1.00)
Glucose, Bld: 127 mg/dL — ABNORMAL HIGH (ref 70–99)
HCT: 45 % (ref 36.0–46.0)
Hemoglobin: 15.3 g/dL — ABNORMAL HIGH (ref 12.0–15.0)
Potassium: 3.5 mmol/L (ref 3.5–5.1)
Sodium: 140 mmol/L (ref 135–145)
TCO2: 25 mmol/L (ref 22–32)

## 2021-09-12 LAB — COMPREHENSIVE METABOLIC PANEL
ALT: 22 U/L (ref 0–44)
AST: 28 U/L (ref 15–41)
Albumin: 4.4 g/dL (ref 3.5–5.0)
Alkaline Phosphatase: 73 U/L (ref 38–126)
Anion gap: 12 (ref 5–15)
BUN: 16 mg/dL (ref 8–23)
CO2: 25 mmol/L (ref 22–32)
Calcium: 9.9 mg/dL (ref 8.9–10.3)
Chloride: 102 mmol/L (ref 98–111)
Creatinine, Ser: 1.11 mg/dL — ABNORMAL HIGH (ref 0.44–1.00)
GFR, Estimated: 51 mL/min — ABNORMAL LOW (ref >60)
Glucose, Bld: 135 mg/dL — ABNORMAL HIGH (ref 70–99)
Potassium: 3.5 mmol/L (ref 3.5–5.1)
Sodium: 139 mmol/L (ref 135–145)
Total Bilirubin: 0.7 mg/dL (ref 0.3–1.2)
Total Protein: 7.2 g/dL (ref 6.5–8.1)

## 2021-09-12 LAB — RESP PANEL BY RT-PCR (FLU A&B, COVID) ARPGX2
Influenza A by PCR: NEGATIVE
Influenza B by PCR: NEGATIVE
SARS Coronavirus 2 by RT PCR: NEGATIVE

## 2021-09-12 LAB — CDS SEROLOGY

## 2021-09-12 MED ORDER — IOHEXOL 300 MG/ML  SOLN
100.0000 mL | Freq: Once | INTRAMUSCULAR | Status: AC | PRN
  Administered 2021-09-12: 16:00:00 100 mL via INTRAVENOUS

## 2021-09-12 NOTE — ED Notes (Signed)
Xray bedside.
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
## 2021-09-12 NOTE — ED Notes (Signed)
Pt ambulated to restroom with sons assistance, tolerated well.
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
## 2021-09-12 NOTE — ED Notes (Signed)
Patient transported to CT 
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
## 2021-09-19 NOTE — Progress Notes (Signed)
Carelink Summary Report / Loop Recorder 

## 2021-09-25 ENCOUNTER — Encounter: Payer: Self-pay | Admitting: Adult Health

## 2021-09-25 ENCOUNTER — Ambulatory Visit (INDEPENDENT_AMBULATORY_CARE_PROVIDER_SITE_OTHER): Payer: Medicare Other | Admitting: Adult Health

## 2021-09-25 VITALS — BP 135/73 | HR 88 | Ht 65.0 in | Wt 136.2 lb

## 2021-09-25 DIAGNOSIS — I639 Cerebral infarction, unspecified: Secondary | ICD-10-CM | POA: Diagnosis not present

## 2021-09-25 NOTE — Progress Notes (Signed)
[facility] [address] [place].  [address] [phone]       STROKE FOLLOW UP NOTE  [name] Date of Birth:  1941/08/20 Medical Record Number:  [id number]   Reason for Referral: stroke follow up    SUBJECTIVE:   CHIEF COMPLAINT:  Chief Complaint  Patient presents with   Follow-up    Room 2. Pt is alone and reports she is doing well.      HPI:   Update 09/25/2021 JM: Patient returns for 92-month stroke follow-up.  Overall stable.  Denies new stroke/TIA symptoms.  Recovered well with only occasional word finding difficulties. Denies cognitive difficulties. Maintains ADLs and IADLs independently as well as driving.  Compliant on Plavix without side effects. Switched from atorvastatin 80mg  to Crestor 40mg  due to continued elevated LDL - tolerating wtihout side effects.  Recent lipid panel 11/16 showed LDL 57. Blood pressure today 135/73.  Loop recorder has not shown atrial fibrillation thus far. No further concerns at this time.      History provided for reference purposes only Update 03/14/2021 JM: Ms. [name] returns for 11-month stroke follow-up unaccompanied.  She has been doing well since prior visit with improvement of cognition and memory and word finding difficulties.  Denies new stroke/TIA symptoms.  Remains on Plavix and atorvastatin without associated side effects.  Blood pressure today 139/77.  Loop recorder has not shown atrial fibrillation thus far.  Prior chest cellulitis around ILR site resolved after antibiotic course.  No new concerns at this time.  Initial visit 11/10/2020: Ms. [name] is being seen for hospital follow-up unaccompanied.  She does report mild cognitive issues such as delayed word finding or delayed recall but no residual weakness or numbness Denies new stroke/TIA symptoms  Completed 3 weeks DAPT and remains on Plavix alone without bleeding or bruising - questions ongoing use of Plavix versus aspirin Remains on atorvastatin  40 mg daily without myalgias Blood pressure today 133/80 - monitors at home which has been stable  Loop recorder has not shown atrial fibrillation thus far She is currently being treated for cellulitis around her ILR site - per pt, this has been slowly healing currently being monitored and managed by Dr. Rayann Heman.  She is hopeful ILR will be able to stay in and not need to be removed  Stroke admission 10/04/2020 Gwendolyn Morrow is a 81 y.o. female PMHx HTN and HLD who presented to [facility] ED on 10/04/2020 with right sided numbness and right arm weakness with difficulty concentrating. She was evaluated by [facility] and was recommended for transfer to [facility] where she was evaluated by Dr. Leonie Man.  Personally reviewed hospitalization pertinent progress notes, lab work and imaging with summary provided.  Stroke work-up revealed left PCA distribution infarct, likely embolic secondary to unknown source.  Loop recorder placed for long-term monitoring of possible atrial fibrillation and stroke etiology.  Recommended DAPT for 3 weeks and Plavix alone due to questionable aspirin allergy.  HTN stable.  LDL 90 and increase home atorvastatin from 20 mg to 40 mg daily.  Other stroke risk factors include advanced age and CAD but no prior stroke history.  She was discharged home in stable condition without therapy needs.  STROKE: Left PCA distribution infarct likely embolic with unknown source likely cryptogenic  CTA head : Severe stenosis within the proximal P2 segment of the left posterior cerebral artery. CTA neck:The common and internal carotid arteries are patent within the neck without hemodynamically significant stenosis. Mildatherosclerotic plaque within  both carotid systems as described. Nonspecific tortuosity and ectasia of the mid to distal cervical left ICA, which measures up to 8 mm in diameter. MRI brain: Patchy small volume acute to early subacute left PCA distribution infarct as above.  2D Echo: EF 65% S/p loop  recorder placement No antithrombotic PTA.  Recommended DAPT for 3 weeks then Plavix alone due to possible aspirin allergy LDL 90 HgbA1c 5.3 VTE prophylaxis is recommended Therapy recommendations:  No needs Disposition:  Home     ROS:   14 system review of systems performed and negative with exception of see HPI  PMH:  Past Medical History:  Diagnosis Date   Arthritis    Complication of anesthesia    GERD (gastroesophageal reflux disease)    H/O seasonal allergies    Headache    Hypertension    PONV (postoperative nausea and vomiting)     PSH:  Past Surgical History:  Procedure Laterality Date   ABDOMINAL HYSTERECTOMY     APPENDECTOMY     removed during C-section   BREAST SURGERY     right breast biopsy- benign   CESAREAN SECTION     HAND SURGERY     bilateral thumb joints replaced   LOOP RECORDER INSERTION N/A 10/06/2020   Procedure: LOOP RECORDER INSERTION;  Surgeon: Thompson Grayer, MD;  Location: [facility];  Service: Cardiovascular;  Laterality: N/A;   TOTAL KNEE ARTHROPLASTY Right 09/24/2016   Procedure: TOTAL KNEE ARTHROPLASTY;  Surgeon: Paralee Cancel, MD;  Location: [facility];  Service: Orthopedics;  Laterality: Right;   TUBAL LIGATION      Social History:  Social History   Socioeconomic History   Marital status: Widowed    Spouse name: Not on file   Number of children: Not on file   Years of education: Not on file   Highest education level: Not on file  Occupational History   Not on file  Tobacco Use   Smoking status: Never   Smokeless tobacco: Never  Vaping Use   Vaping Use: Never used  Substance and Sexual Activity   Alcohol use: Yes    Comment: rarely   Drug use: No   Sexual activity: Not on file  Other Topics Concern   Not on file  Social History Narrative   Not on file   Social Determinants of Health   Financial Resource Strain: Not on file  Food Insecurity: Not on file  Transportation Needs: Not on file  Physical Activity: Not on  file  Stress: Not on file  Social Connections: Not on file  Intimate Partner Violence: Not on file    Family History: No family history on file.  Medications:   Current Outpatient Medications on File Prior to Visit  Medication Sig Dispense Refill   acyclovir (ZOVIRAX) 400 MG tablet Take 400 mg by mouth 3 (three) times daily as needed (fever blisters).      amLODipine (NORVASC) 10 MG tablet Take 10 mg by mouth daily.     Ascorbic Acid (VITAMIN C) 1000 MG tablet Take 1,000 mg by mouth every evening.     BIOTIN PO Take 15 mg by mouth daily.     calcium carbonate (OSCAL) 1500 (600 Ca) MG TABS tablet Take 600 mg of elemental calcium by mouth daily with breakfast.     clopidogrel (PLAVIX) 75 MG tablet Take 1 tablet (75 mg total) by mouth daily. 30 tablet 11   Multiple Vitamin (MULTIVITAMIN WITH MINERALS) TABS tablet Take 1 tablet by mouth  daily.     nitrofurantoin (MACRODANTIN) 25 MG capsule Take by mouth.     OMEGA-3 KRILL OIL PO Take 350 mg by mouth every evening.     pantoprazole (PROTONIX) 40 MG tablet Take 1 tablet (40 mg total) by mouth daily. 30 tablet 11   potassium chloride SA (KLOR-CON) 20 MEQ tablet Take 20 mEq by mouth daily.     rosuvastatin (CRESTOR) 40 MG tablet Take 1 tablet (40 mg total) by mouth daily. 30 tablet 5   triamterene-hydrochlorothiazide (MAXZIDE-25) 37.5-25 MG tablet Take 1 tablet by mouth daily.     Turmeric 500 MG CAPS Take 500 mg by mouth every evening.     vitamin B-12 (CYANOCOBALAMIN) 500 MCG tablet Take 500 mcg by mouth 2 (two) times a week. Take on Mon & Fri     No current facility-administered medications on file prior to visit.    Allergies:   Allergies  Allergen Reactions   Penicillins Anaphylaxis    Has patient had a PCN reaction causing immediate rash, facial/tongue/throat swelling, SOB or lightheadedness with hypotension: Yes Has patient had a PCN reaction causing severe rash involving mucus membranes or skin necrosis: No Has patient had a PCN  reaction that required hospitalization Yes Has patient had a PCN reaction occurring within the last 10 years: No If all of the above answers are "NO", then may proceed with Cephalosporin use.    Enalapril Cough    Other reaction(s): cough   Aspirin-Acetaminophen-Caffeine     Other reaction(s): Other (See Comments) EXCEDRIN   Excedrin Extra Strength [Asa-Apap-Caff Buffered] Hives   Excedrin Tension Headache [Acetaminophen-Caffeine] Hives   Oxycodone-Acetaminophen Itching   Penicillin G     Other reaction(s): Cardiac   Tyloxapol Hives    Other reaction(s): itching/hives      OBJECTIVE:  Physical Exam  Vitals:   09/25/21 1436  BP: 135/73  Pulse: 88  Weight: 136 lb 3.2 oz (61.8 kg)  Height: 5\' 5"  (1.651 m)    Body mass index is 22.66 kg/m. No results found.  General: well developed, well nourished,  very pleasant elderly Caucasian female, seated, in no evident distress Head: head normocephalic and atraumatic.   Neck: supple with no carotid or supraclavicular bruits Cardiovascular: regular rate and rhythm, no murmurs Musculoskeletal: no deformity Vascular:  Normal pulses all extremities   Neurologic Exam Mental Status: Awake and fully alert.   Fluent speech and language. Oriented to place and time. Recent and remote memory intact. Attention span, concentration and fund of knowledge appropriate during visit. Mood and affect appropriate.  Cranial Nerves: Pupils equal, briskly reactive to light. Extraocular movements full without nystagmus. Visual fields full to confrontation. Hearing intact. Facial sensation intact. Face, tongue, palate moves normally and symmetrically.  Motor: Normal bulk and tone. Normal strength in all tested extremity muscles Sensory.: intact to touch , pinprick , position and vibratory sensation.  Coordination: Rapid alternating movements normal in all extremities. Finger-to-nose and heel-to-shin performed accurately bilaterally. Gait and Station:  Arises from chair without difficulty. Stance is normal. Gait demonstrates normal stride length and balance without use of assistive device Reflexes: 1+ and symmetric. Toes downgoing.         ASSESSMENT: Gwendolyn Morrow is a 81 y.o. year old female presented with right-sided numbness, right arm weakness and difficulty concentrating on 10/04/2020 with stroke work-up revealing left PCA distribution infarct likely embolic secondary to unknown source s/p ILR with occasional word finding difficulties. Vascular risk factors include HTN, HLD, advanced age and CAD.  PLAN:  L PCA stroke, cryptogenic:  Loop recorder has not shown atrial fibrillation thus far -monthly reports personally reviewed. Hx of chest cellulitis at ILR site which resolved with abx treatment Continue clopidogrel 75 mg daily  and Crestor 40mg  daily for secondary stroke prevention.  Remains on Plavix due to questionable aspirin allergy.  Discussed secondary stroke prevention measures and importance of close PCP follow up for aggressive stroke risk factor management  HTN: BP goal <130/90.  Well-controlled on current regimen per PCP HLD: LDL goal <70. Prior LDL 84 (04/2021) despite high dose atorvastatin - switched to Crestor 40mg  daily in 04/2021 - repeat lipid panel 07/2021 LDL 57   Overall stable from stroke standpoint - request ongoing f/u with PCP for continued stroke prevention management - can follow up here as needed for stroke    CC:  Koirala, Dibas, MD    I spent 26 minutes of face-to-face and non-face-to-face time with patient.  This included previsit chart review, lab review, study review, order entry, electronic health record documentation, patient education and discussion regarding hx of prior stroke and minimal residual deficits, secondary stroke prevention measures and agressive stroke risk factor management and answered all other questions to patients satisfaction   Frann Rider, AGNP-BC  [facility] [address] [place], [place] [address]  Phone [phone] Fax [phone] Note: This document was prepared with digital dictation and possible smart phrase technology. Any transcriptional errors that result from this process are unintentional.

## 2021-09-25 NOTE — Patient Instructions (Addendum)
Continue clopidogrel 75 mg daily  and Crestor 40mg  daily  for secondary stroke prevention - please speak to PCP regarding medication refills.  Continue to follow up with PCP regarding cholesterol and blood pressure management  Maintain strict control of hypertension with blood pressure goal below 130/90 and cholesterol with LDL cholesterol (bad cholesterol) goal below 70 mg/dL.   Signs of a Stroke? Follow the BEFAST method:  Balance Watch for a sudden loss of balance, trouble with coordination or vertigo Eyes Is there a sudden loss of vision in one or both eyes? Or double vision?  Face: Ask the person to smile. Does one side of the face droop or is it numb?  Arms: Ask the person to raise both arms. Does one arm drift downward? Is there weakness or numbness of a leg? Speech: Ask the person to repeat a simple phrase. Does the speech sound slurred/strange? Is the person confused ? Time: If you observe any of these signs, call 911.        Thank you for coming to see [place] at [facility]. I hope we have been able to provide you high quality care today.  You may receive a patient satisfaction survey over the next few weeks. We would appreciate your feedback and comments so that we may continue to improve ourselves and the health of our patients.

## 2021-09-26 ENCOUNTER — Ambulatory Visit: Payer: Medicare Other | Admitting: Adult Health

## 2021-10-11 ENCOUNTER — Ambulatory Visit (INDEPENDENT_AMBULATORY_CARE_PROVIDER_SITE_OTHER): Payer: Medicare Other

## 2021-10-11 DIAGNOSIS — I639 Cerebral infarction, unspecified: Secondary | ICD-10-CM

## 2021-10-16 LAB — CUP PACEART REMOTE DEVICE CHECK
Date Time Interrogation Session: 20230129231123
Implantable Pulse Generator Implant Date: 20220120

## 2021-10-20 NOTE — Progress Notes (Signed)
Remote pacemaker transmission.   

## 2021-11-02 DIAGNOSIS — N3020 Other chronic cystitis without hematuria: Secondary | ICD-10-CM | POA: Diagnosis not present

## 2021-11-02 DIAGNOSIS — N952 Postmenopausal atrophic vaginitis: Secondary | ICD-10-CM | POA: Diagnosis not present

## 2021-11-13 ENCOUNTER — Ambulatory Visit (INDEPENDENT_AMBULATORY_CARE_PROVIDER_SITE_OTHER): Payer: Medicare Other

## 2021-11-13 DIAGNOSIS — I639 Cerebral infarction, unspecified: Secondary | ICD-10-CM | POA: Diagnosis not present

## 2021-11-13 LAB — CUP PACEART REMOTE DEVICE CHECK
Date Time Interrogation Session: 20230226232508
Implantable Pulse Generator Implant Date: 20220120

## 2021-11-16 DIAGNOSIS — N390 Urinary tract infection, site not specified: Secondary | ICD-10-CM | POA: Diagnosis not present

## 2021-11-16 DIAGNOSIS — I10 Essential (primary) hypertension: Secondary | ICD-10-CM | POA: Diagnosis not present

## 2021-11-16 DIAGNOSIS — K219 Gastro-esophageal reflux disease without esophagitis: Secondary | ICD-10-CM | POA: Diagnosis not present

## 2021-11-16 DIAGNOSIS — Z8673 Personal history of transient ischemic attack (TIA), and cerebral infarction without residual deficits: Secondary | ICD-10-CM | POA: Diagnosis not present

## 2021-11-16 DIAGNOSIS — E7800 Pure hypercholesterolemia, unspecified: Secondary | ICD-10-CM | POA: Diagnosis not present

## 2021-11-20 ENCOUNTER — Encounter: Payer: Self-pay | Admitting: Internal Medicine

## 2021-11-20 NOTE — Progress Notes (Signed)
Carelink Summary Report / Loop Recorder 

## 2021-11-21 NOTE — Telephone Encounter (Signed)
I spoke with the patient and let her know how the monitor works. She has a better understanding and thanked me for the call. ?

## 2021-12-13 DIAGNOSIS — I10 Essential (primary) hypertension: Secondary | ICD-10-CM | POA: Diagnosis not present

## 2021-12-13 DIAGNOSIS — K219 Gastro-esophageal reflux disease without esophagitis: Secondary | ICD-10-CM | POA: Diagnosis not present

## 2021-12-13 DIAGNOSIS — E7800 Pure hypercholesterolemia, unspecified: Secondary | ICD-10-CM | POA: Diagnosis not present

## 2021-12-15 ENCOUNTER — Other Ambulatory Visit: Payer: Self-pay | Admitting: Adult Health

## 2021-12-18 ENCOUNTER — Ambulatory Visit (INDEPENDENT_AMBULATORY_CARE_PROVIDER_SITE_OTHER): Payer: Medicare Other

## 2021-12-18 DIAGNOSIS — I639 Cerebral infarction, unspecified: Secondary | ICD-10-CM | POA: Diagnosis not present

## 2021-12-18 LAB — CUP PACEART REMOTE DEVICE CHECK
Date Time Interrogation Session: 20230331230729
Implantable Pulse Generator Implant Date: 20220120

## 2022-01-01 NOTE — Progress Notes (Signed)
Carelink Summary Report / Loop Recorder 

## 2022-01-12 DIAGNOSIS — M25551 Pain in right hip: Secondary | ICD-10-CM | POA: Diagnosis not present

## 2022-01-12 DIAGNOSIS — M160 Bilateral primary osteoarthritis of hip: Secondary | ICD-10-CM | POA: Diagnosis not present

## 2022-01-12 DIAGNOSIS — M25552 Pain in left hip: Secondary | ICD-10-CM | POA: Diagnosis not present

## 2022-01-15 DIAGNOSIS — M542 Cervicalgia: Secondary | ICD-10-CM | POA: Diagnosis not present

## 2022-01-15 DIAGNOSIS — M7541 Impingement syndrome of right shoulder: Secondary | ICD-10-CM | POA: Diagnosis not present

## 2022-01-15 DIAGNOSIS — M25511 Pain in right shoulder: Secondary | ICD-10-CM | POA: Diagnosis not present

## 2022-01-15 DIAGNOSIS — M25512 Pain in left shoulder: Secondary | ICD-10-CM | POA: Diagnosis not present

## 2022-01-18 LAB — CUP PACEART REMOTE DEVICE CHECK
Date Time Interrogation Session: 20230503231525
Implantable Pulse Generator Implant Date: 20220120

## 2022-01-22 ENCOUNTER — Ambulatory Visit (INDEPENDENT_AMBULATORY_CARE_PROVIDER_SITE_OTHER): Payer: Medicare Other

## 2022-01-22 ENCOUNTER — Other Ambulatory Visit: Payer: Self-pay | Admitting: Adult Health

## 2022-01-22 DIAGNOSIS — I639 Cerebral infarction, unspecified: Secondary | ICD-10-CM | POA: Diagnosis not present

## 2022-01-25 DIAGNOSIS — M722 Plantar fascial fibromatosis: Secondary | ICD-10-CM | POA: Diagnosis not present

## 2022-01-25 DIAGNOSIS — E559 Vitamin D deficiency, unspecified: Secondary | ICD-10-CM | POA: Diagnosis not present

## 2022-01-25 DIAGNOSIS — Z79899 Other long term (current) drug therapy: Secondary | ICD-10-CM | POA: Diagnosis not present

## 2022-01-25 DIAGNOSIS — E7800 Pure hypercholesterolemia, unspecified: Secondary | ICD-10-CM | POA: Diagnosis not present

## 2022-01-25 DIAGNOSIS — D81818 Other biotin-dependent carboxylase deficiency: Secondary | ICD-10-CM | POA: Diagnosis not present

## 2022-01-30 DIAGNOSIS — E7800 Pure hypercholesterolemia, unspecified: Secondary | ICD-10-CM | POA: Diagnosis not present

## 2022-01-30 DIAGNOSIS — Z8673 Personal history of transient ischemic attack (TIA), and cerebral infarction without residual deficits: Secondary | ICD-10-CM | POA: Diagnosis not present

## 2022-01-30 DIAGNOSIS — K219 Gastro-esophageal reflux disease without esophagitis: Secondary | ICD-10-CM | POA: Diagnosis not present

## 2022-01-30 DIAGNOSIS — Z0000 Encounter for general adult medical examination without abnormal findings: Secondary | ICD-10-CM | POA: Diagnosis not present

## 2022-01-30 DIAGNOSIS — E559 Vitamin D deficiency, unspecified: Secondary | ICD-10-CM | POA: Diagnosis not present

## 2022-01-30 DIAGNOSIS — D81818 Other biotin-dependent carboxylase deficiency: Secondary | ICD-10-CM | POA: Diagnosis not present

## 2022-01-30 DIAGNOSIS — N390 Urinary tract infection, site not specified: Secondary | ICD-10-CM | POA: Diagnosis not present

## 2022-01-31 DIAGNOSIS — M25552 Pain in left hip: Secondary | ICD-10-CM | POA: Diagnosis not present

## 2022-01-31 DIAGNOSIS — M25551 Pain in right hip: Secondary | ICD-10-CM | POA: Diagnosis not present

## 2022-02-12 IMAGING — MR MR HEAD W/O CM
11 series · 48 of 48 positions shown · non-contrast
Comparison: Previous MRI from 01/21/2013.

CLINICAL DATA: Initial evaluation for acute TIA, right-sided
numbness and tingling. Weakness.

EXAM:
MRI HEAD WITHOUT CONTRAST
TECHNIQUE: Multiplanar, multiecho pulse sequences of the brain and surrounding
structures were obtained without intravenous contrast.

[Series 5: DWI · axial · 3.0mm · 1.36mm/px · z∈[-59,+99]mm · 7 of 108 slices shown (1 of 4)]
[im 1/108]
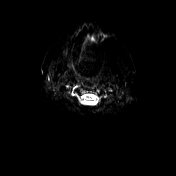
[im 18/108]
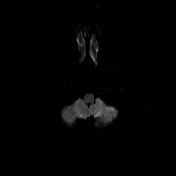
[im 36/108]
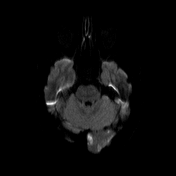
[im 54/108]
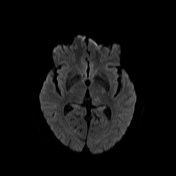
[im 72/108]
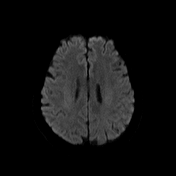
[im 90/108]
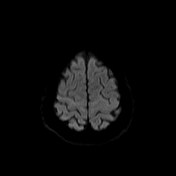
[im 108/108]
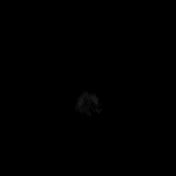

[Series 6: DWI · axial · 3.0mm · 1.36mm/px · z∈[-59,+99]mm · 3 of 54 slices shown (2 of 4)]
[im 1/54]
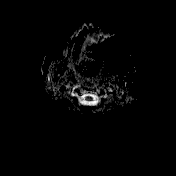
[im 27/54]
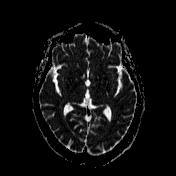
[im 54/54]
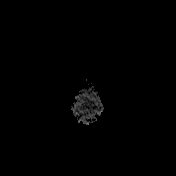

[Series 7: mip_images(sw) · axial · 24.0mm · 0.69mm/px · z∈[-48,+84]mm · 3 of 45 slices shown]
[im 1/45]
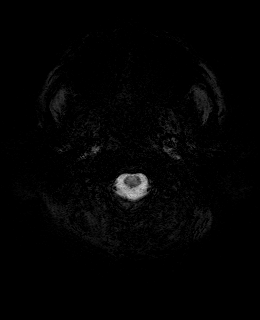
[im 23/45]
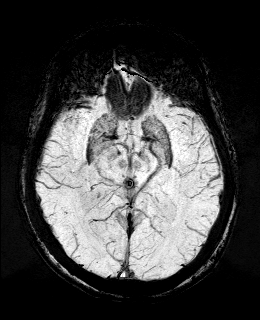
[im 45/45]
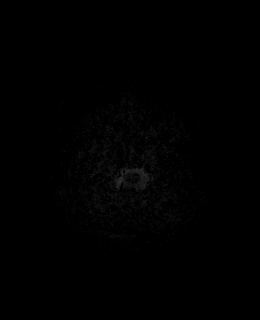

[Series 8: swi_images · axial · 3.0mm · 0.69mm/px · z∈[-58,+94]mm · 4 of 52 slices shown]
[im 1/52]
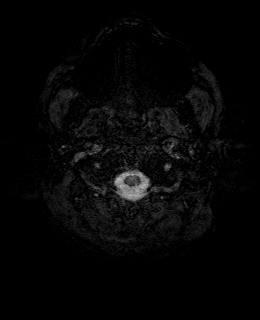
[im 18/52]
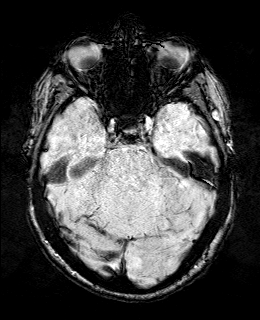
[im 35/52]
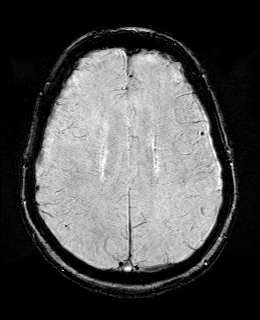
[im 52/52]
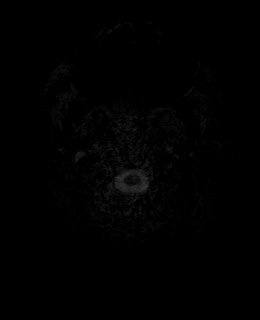

[Series 9: FLAIR · axial · 3.0mm · 0.69mm/px · z∈[-59,+97]mm · 4 of 53 slices shown]
[im 1/53]
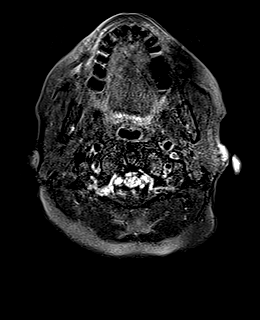
[im 18/53]
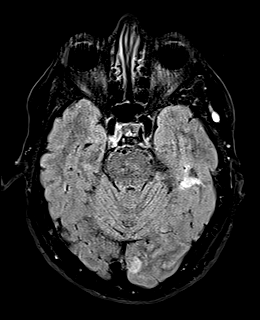
[im 35/53]
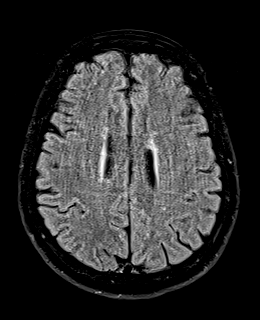
[im 53/53]
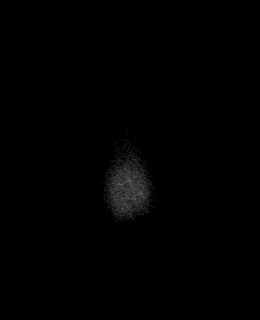

[Series 10: DWI · coronal · 5.0mm · 1.31mm/px · 5 of 72 slices shown (3 of 4)]
[im 1/72]
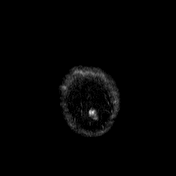
[im 18/72]
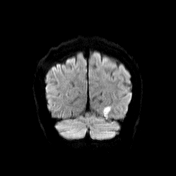
[im 36/72]
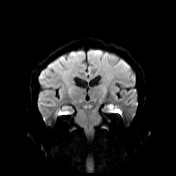
[im 54/72]
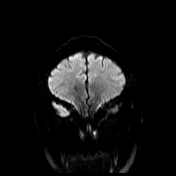
[im 72/72]
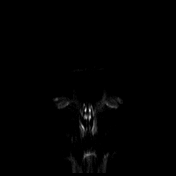

[Series 11: DWI · coronal · 5.0mm · 1.31mm/px · 3 of 36 slices shown (4 of 4)]
[im 1/36]
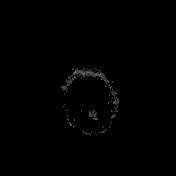
[im 18/36]
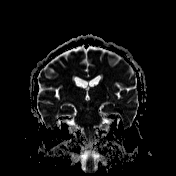
[im 36/36]
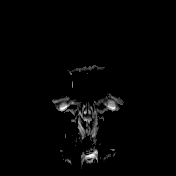

[Series 12: T1 · sagittal · 5.0mm · 0.75mm/px · 2 of 25 slices shown (1 of 2)]
[im 1/25]
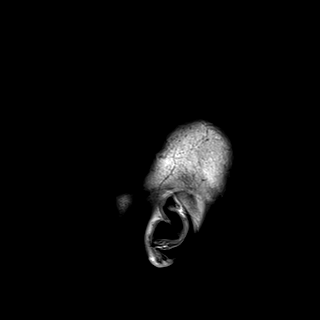
[im 25/25]
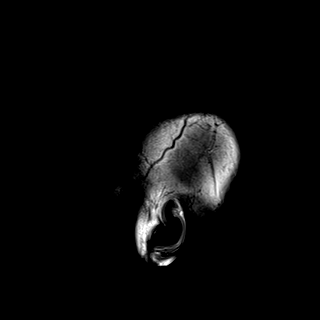

[Series 13: T2 · axial · 5.0mm · 0.57mm/px · z∈[-59,+97]mm · 2 of 25 slices shown (1 of 2)]
[im 1/25]
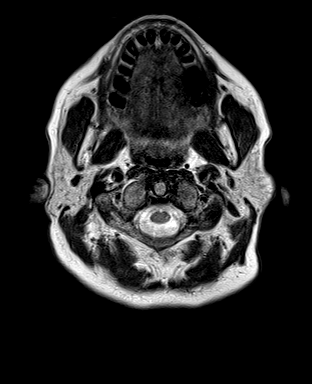
[im 25/25]
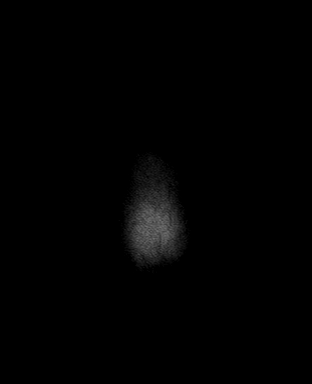

[Series 14: T1 · axial · 1.0mm · 0.86mm/px · z∈[-60,+98]mm · 12 of 160 slices shown (2 of 2)]
[im 1/160]
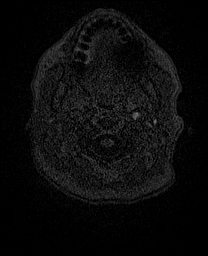
[im 15/160]
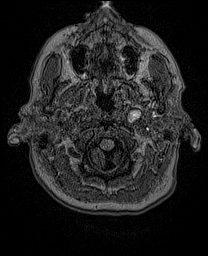
[im 29/160]
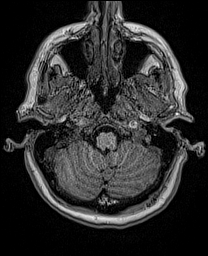
[im 44/160]
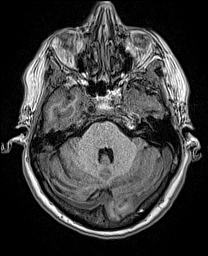
[im 58/160]
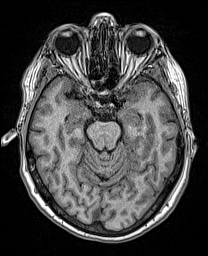
[im 73/160]
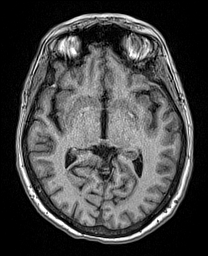
[im 87/160]
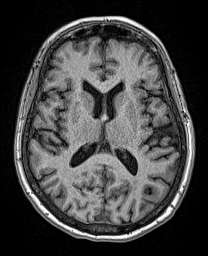
[im 102/160]
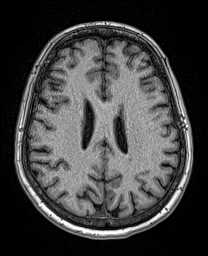
[im 116/160]
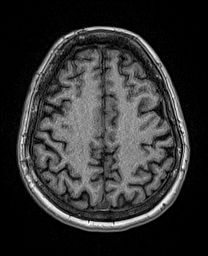
[im 131/160]
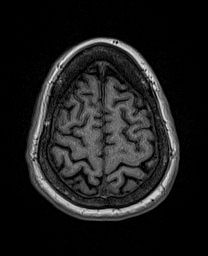
[im 145/160]
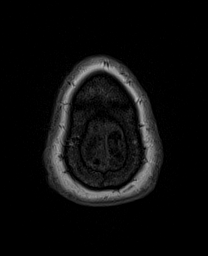
[im 160/160]
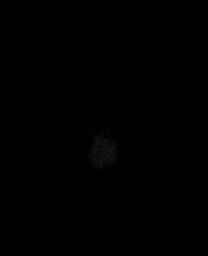

[Series 15: T2 · coronal · 5.0mm · 0.57mm/px · 3 of 36 slices shown (2 of 2)]
[im 1/36]
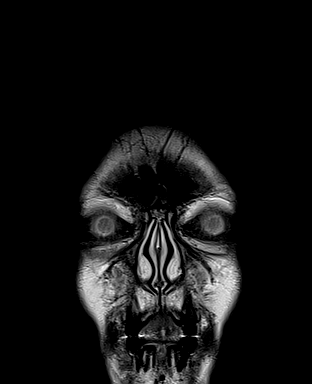
[im 18/36]
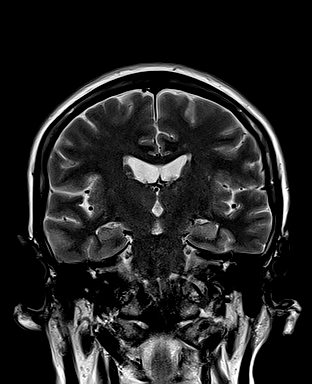
[im 36/36]
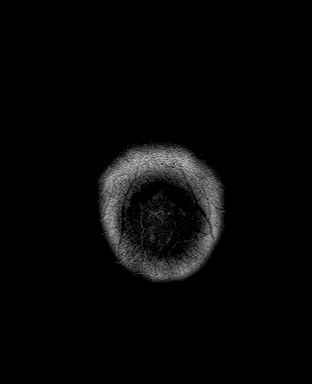

[48 of 48 positions shown; findings below may reference images not displayed]

FINDINGS: Brain: Cerebral volume within normal limits for age. No significant
cerebral white matter disease.

Patchy multifocal areas of small volume restricted diffusion seen
involving the parasagittal left temporal occipital region,
consistent with an acute to early subacute left PCA distribution
infarct (series 5, image 76). Subtle patchy involvement of the left
thalamus noted. No associated hemorrhage or mass effect. No other
diffusion abnormality to suggest acute or subacute ischemia
elsewhere within the brain. Gray-white matter differentiation
otherwise maintained. No encephalomalacia to suggest chronic
cortical infarction elsewhere. No foci of susceptibility artifact to
suggest acute or chronic intracranial hemorrhage.

No mass lesion, midline shift or mass effect. No hydrocephalus or
extra-axial fluid collection. Pituitary gland suprasellar region
normal. Midline structures intact.

Vascular: Major intracranial vascular flow voids are maintained.
Increased FLAIR signal intensity without associated T1 correlate
within the left transverse and sigmoid sinus is likely related to
slow/sluggish flow. No frank imaging findings to suggest dural
venous thrombosis.

Skull and upper cervical spine: Craniocervical junction within
normal limits. Bone marrow signal intensity normal. No scalp soft
tissue abnormality.

Sinuses/Orbits: Globes and orbital soft tissues within normal
limits. Paranasal sinuses are clear. No mastoid effusion. Inner ear
structures normal.

Other: None.
IMPRESSION: 1. Patchy small volume acute to early subacute left PCA distribution
infarct as above. No associated hemorrhage or mass effect.
2. Otherwise normal brain MRI for age.

## 2022-02-13 NOTE — Progress Notes (Signed)
Carelink Summary Report / Loop Recorder 

## 2022-02-26 ENCOUNTER — Ambulatory Visit (INDEPENDENT_AMBULATORY_CARE_PROVIDER_SITE_OTHER): Payer: Medicare Other

## 2022-02-26 DIAGNOSIS — I639 Cerebral infarction, unspecified: Secondary | ICD-10-CM | POA: Diagnosis not present

## 2022-02-27 LAB — CUP PACEART REMOTE DEVICE CHECK
Date Time Interrogation Session: 20230605231020
Implantable Pulse Generator Implant Date: 20220120

## 2022-02-28 DIAGNOSIS — M1712 Unilateral primary osteoarthritis, left knee: Secondary | ICD-10-CM | POA: Diagnosis not present

## 2022-03-07 DIAGNOSIS — M1712 Unilateral primary osteoarthritis, left knee: Secondary | ICD-10-CM | POA: Diagnosis not present

## 2022-03-15 NOTE — Progress Notes (Signed)
Carelink Summary Report / Loop Recorder 

## 2022-03-29 LAB — CUP PACEART REMOTE DEVICE CHECK
Date Time Interrogation Session: 20230708230603
Implantable Pulse Generator Implant Date: 20220120

## 2022-04-02 ENCOUNTER — Ambulatory Visit (INDEPENDENT_AMBULATORY_CARE_PROVIDER_SITE_OTHER): Payer: Medicare Other

## 2022-04-02 DIAGNOSIS — I639 Cerebral infarction, unspecified: Secondary | ICD-10-CM | POA: Diagnosis not present

## 2022-04-11 DIAGNOSIS — I10 Essential (primary) hypertension: Secondary | ICD-10-CM | POA: Diagnosis not present

## 2022-04-11 DIAGNOSIS — E7800 Pure hypercholesterolemia, unspecified: Secondary | ICD-10-CM | POA: Diagnosis not present

## 2022-05-03 NOTE — Progress Notes (Signed)
Carelink Summary Report / Loop Recorder 

## 2022-05-07 ENCOUNTER — Ambulatory Visit (INDEPENDENT_AMBULATORY_CARE_PROVIDER_SITE_OTHER): Payer: Medicare Other

## 2022-05-07 DIAGNOSIS — I639 Cerebral infarction, unspecified: Secondary | ICD-10-CM | POA: Diagnosis not present

## 2022-05-08 LAB — CUP PACEART REMOTE DEVICE CHECK
Date Time Interrogation Session: 20230820230736
Implantable Pulse Generator Implant Date: 20220120

## 2022-05-22 DIAGNOSIS — Z23 Encounter for immunization: Secondary | ICD-10-CM | POA: Diagnosis not present

## 2022-06-03 NOTE — Progress Notes (Signed)
Carelink Summary Report / Loop Recorder 

## 2022-06-11 ENCOUNTER — Ambulatory Visit (INDEPENDENT_AMBULATORY_CARE_PROVIDER_SITE_OTHER): Payer: Medicare Other

## 2022-06-11 DIAGNOSIS — I639 Cerebral infarction, unspecified: Secondary | ICD-10-CM | POA: Diagnosis not present

## 2022-06-12 LAB — CUP PACEART REMOTE DEVICE CHECK
Date Time Interrogation Session: 20230922230625
Implantable Pulse Generator Implant Date: 20220120

## 2022-06-18 DIAGNOSIS — Z23 Encounter for immunization: Secondary | ICD-10-CM | POA: Diagnosis not present

## 2022-06-25 NOTE — Progress Notes (Signed)
Carelink Summary Report / Loop Recorder 

## 2022-07-13 LAB — CUP PACEART REMOTE DEVICE CHECK
Date Time Interrogation Session: 20231025231817
Implantable Pulse Generator Implant Date: 20220120

## 2022-07-15 DIAGNOSIS — N3001 Acute cystitis with hematuria: Secondary | ICD-10-CM | POA: Diagnosis not present

## 2022-07-16 ENCOUNTER — Telehealth: Payer: Self-pay | Admitting: Internal Medicine

## 2022-07-16 ENCOUNTER — Ambulatory Visit: Payer: Medicare Other | Attending: Cardiology

## 2022-07-16 DIAGNOSIS — I639 Cerebral infarction, unspecified: Secondary | ICD-10-CM | POA: Diagnosis not present

## 2022-07-16 NOTE — Telephone Encounter (Signed)
I spoke with the patient and she is all set up properly with the app.

## 2022-07-16 NOTE — Telephone Encounter (Signed)
Pt would like a callback regarding setting her phone up to be linked to device. Please advise

## 2022-08-02 DIAGNOSIS — H52203 Unspecified astigmatism, bilateral: Secondary | ICD-10-CM | POA: Diagnosis not present

## 2022-08-02 DIAGNOSIS — H43813 Vitreous degeneration, bilateral: Secondary | ICD-10-CM | POA: Diagnosis not present

## 2022-08-02 DIAGNOSIS — H25813 Combined forms of age-related cataract, bilateral: Secondary | ICD-10-CM | POA: Diagnosis not present

## 2022-08-03 DIAGNOSIS — Z79899 Other long term (current) drug therapy: Secondary | ICD-10-CM | POA: Diagnosis not present

## 2022-08-03 DIAGNOSIS — Z8673 Personal history of transient ischemic attack (TIA), and cerebral infarction without residual deficits: Secondary | ICD-10-CM | POA: Diagnosis not present

## 2022-08-03 DIAGNOSIS — N390 Urinary tract infection, site not specified: Secondary | ICD-10-CM | POA: Diagnosis not present

## 2022-08-03 DIAGNOSIS — I10 Essential (primary) hypertension: Secondary | ICD-10-CM | POA: Diagnosis not present

## 2022-08-03 DIAGNOSIS — R300 Dysuria: Secondary | ICD-10-CM | POA: Diagnosis not present

## 2022-08-03 DIAGNOSIS — E7800 Pure hypercholesterolemia, unspecified: Secondary | ICD-10-CM | POA: Diagnosis not present

## 2022-08-06 DIAGNOSIS — K219 Gastro-esophageal reflux disease without esophagitis: Secondary | ICD-10-CM | POA: Diagnosis not present

## 2022-08-06 DIAGNOSIS — Z7901 Long term (current) use of anticoagulants: Secondary | ICD-10-CM | POA: Diagnosis not present

## 2022-08-06 DIAGNOSIS — Z86010 Personal history of colonic polyps: Secondary | ICD-10-CM | POA: Diagnosis not present

## 2022-08-06 DIAGNOSIS — K449 Diaphragmatic hernia without obstruction or gangrene: Secondary | ICD-10-CM | POA: Diagnosis not present

## 2022-08-14 DIAGNOSIS — L578 Other skin changes due to chronic exposure to nonionizing radiation: Secondary | ICD-10-CM | POA: Diagnosis not present

## 2022-08-14 DIAGNOSIS — D1722 Benign lipomatous neoplasm of skin and subcutaneous tissue of left arm: Secondary | ICD-10-CM | POA: Diagnosis not present

## 2022-08-14 DIAGNOSIS — L570 Actinic keratosis: Secondary | ICD-10-CM | POA: Diagnosis not present

## 2022-08-14 DIAGNOSIS — C44722 Squamous cell carcinoma of skin of right lower limb, including hip: Secondary | ICD-10-CM | POA: Diagnosis not present

## 2022-08-14 DIAGNOSIS — Z86018 Personal history of other benign neoplasm: Secondary | ICD-10-CM | POA: Diagnosis not present

## 2022-08-14 DIAGNOSIS — Z85828 Personal history of other malignant neoplasm of skin: Secondary | ICD-10-CM | POA: Diagnosis not present

## 2022-08-14 DIAGNOSIS — D485 Neoplasm of uncertain behavior of skin: Secondary | ICD-10-CM | POA: Diagnosis not present

## 2022-08-14 DIAGNOSIS — D225 Melanocytic nevi of trunk: Secondary | ICD-10-CM | POA: Diagnosis not present

## 2022-08-14 DIAGNOSIS — L821 Other seborrheic keratosis: Secondary | ICD-10-CM | POA: Diagnosis not present

## 2022-08-14 DIAGNOSIS — D3610 Benign neoplasm of peripheral nerves and autonomic nervous system, unspecified: Secondary | ICD-10-CM | POA: Diagnosis not present

## 2022-08-18 NOTE — Progress Notes (Signed)
Carelink Summary Report / Loop Recorder 

## 2022-08-20 ENCOUNTER — Ambulatory Visit (INDEPENDENT_AMBULATORY_CARE_PROVIDER_SITE_OTHER): Payer: Medicare Other

## 2022-08-20 DIAGNOSIS — I639 Cerebral infarction, unspecified: Secondary | ICD-10-CM | POA: Diagnosis not present

## 2022-08-20 LAB — CUP PACEART REMOTE DEVICE CHECK
Date Time Interrogation Session: 20231203232035
Implantable Pulse Generator Implant Date: 20220120

## 2022-08-23 DIAGNOSIS — M85851 Other specified disorders of bone density and structure, right thigh: Secondary | ICD-10-CM | POA: Diagnosis not present

## 2022-08-23 DIAGNOSIS — M85852 Other specified disorders of bone density and structure, left thigh: Secondary | ICD-10-CM | POA: Diagnosis not present

## 2022-08-23 DIAGNOSIS — Z780 Asymptomatic menopausal state: Secondary | ICD-10-CM | POA: Diagnosis not present

## 2022-08-23 DIAGNOSIS — Z1231 Encounter for screening mammogram for malignant neoplasm of breast: Secondary | ICD-10-CM | POA: Diagnosis not present

## 2022-08-27 DIAGNOSIS — C44722 Squamous cell carcinoma of skin of right lower limb, including hip: Secondary | ICD-10-CM | POA: Diagnosis not present

## 2022-09-04 DIAGNOSIS — H6120 Impacted cerumen, unspecified ear: Secondary | ICD-10-CM | POA: Diagnosis not present

## 2022-09-24 ENCOUNTER — Ambulatory Visit (INDEPENDENT_AMBULATORY_CARE_PROVIDER_SITE_OTHER): Payer: Medicare Other

## 2022-09-24 DIAGNOSIS — I639 Cerebral infarction, unspecified: Secondary | ICD-10-CM

## 2022-09-25 LAB — CUP PACEART REMOTE DEVICE CHECK
Date Time Interrogation Session: 20240107232208
Implantable Pulse Generator Implant Date: 20220120

## 2022-09-27 DIAGNOSIS — Z5189 Encounter for other specified aftercare: Secondary | ICD-10-CM | POA: Diagnosis not present

## 2022-09-27 NOTE — Progress Notes (Signed)
Carelink Summary Report / Loop Recorder 

## 2022-10-03 DIAGNOSIS — M1712 Unilateral primary osteoarthritis, left knee: Secondary | ICD-10-CM | POA: Diagnosis not present

## 2022-10-09 DIAGNOSIS — K644 Residual hemorrhoidal skin tags: Secondary | ICD-10-CM | POA: Diagnosis not present

## 2022-10-09 DIAGNOSIS — K5730 Diverticulosis of large intestine without perforation or abscess without bleeding: Secondary | ICD-10-CM | POA: Diagnosis not present

## 2022-10-09 DIAGNOSIS — D123 Benign neoplasm of transverse colon: Secondary | ICD-10-CM | POA: Diagnosis not present

## 2022-10-09 DIAGNOSIS — Z86010 Personal history of colonic polyps: Secondary | ICD-10-CM | POA: Diagnosis not present

## 2022-10-09 DIAGNOSIS — D122 Benign neoplasm of ascending colon: Secondary | ICD-10-CM | POA: Diagnosis not present

## 2022-10-09 DIAGNOSIS — K648 Other hemorrhoids: Secondary | ICD-10-CM | POA: Diagnosis not present

## 2022-10-09 DIAGNOSIS — Z09 Encounter for follow-up examination after completed treatment for conditions other than malignant neoplasm: Secondary | ICD-10-CM | POA: Diagnosis not present

## 2022-10-10 DIAGNOSIS — M1712 Unilateral primary osteoarthritis, left knee: Secondary | ICD-10-CM | POA: Diagnosis not present

## 2022-10-11 DIAGNOSIS — D123 Benign neoplasm of transverse colon: Secondary | ICD-10-CM | POA: Diagnosis not present

## 2022-10-17 DIAGNOSIS — M1712 Unilateral primary osteoarthritis, left knee: Secondary | ICD-10-CM | POA: Diagnosis not present

## 2022-10-23 DIAGNOSIS — M25552 Pain in left hip: Secondary | ICD-10-CM | POA: Diagnosis not present

## 2022-10-23 DIAGNOSIS — M25551 Pain in right hip: Secondary | ICD-10-CM | POA: Diagnosis not present

## 2022-10-28 LAB — CUP PACEART REMOTE DEVICE CHECK
Date Time Interrogation Session: 20240209231327
Implantable Pulse Generator Implant Date: 20220120

## 2022-10-29 ENCOUNTER — Ambulatory Visit: Payer: Medicare Other

## 2022-10-29 DIAGNOSIS — I639 Cerebral infarction, unspecified: Secondary | ICD-10-CM | POA: Diagnosis not present

## 2022-10-30 NOTE — Progress Notes (Signed)
Carelink Summary Report / Loop Recorder 

## 2022-10-31 DIAGNOSIS — Z5189 Encounter for other specified aftercare: Secondary | ICD-10-CM | POA: Diagnosis not present

## 2022-10-31 DIAGNOSIS — Z85828 Personal history of other malignant neoplasm of skin: Secondary | ICD-10-CM | POA: Diagnosis not present

## 2022-11-12 DIAGNOSIS — M5136 Other intervertebral disc degeneration, lumbar region: Secondary | ICD-10-CM | POA: Diagnosis not present

## 2022-12-03 ENCOUNTER — Ambulatory Visit (INDEPENDENT_AMBULATORY_CARE_PROVIDER_SITE_OTHER): Payer: Medicare Other

## 2022-12-03 DIAGNOSIS — I639 Cerebral infarction, unspecified: Secondary | ICD-10-CM | POA: Diagnosis not present

## 2022-12-04 DIAGNOSIS — M5451 Vertebrogenic low back pain: Secondary | ICD-10-CM | POA: Diagnosis not present

## 2022-12-04 LAB — CUP PACEART REMOTE DEVICE CHECK
Date Time Interrogation Session: 20240317232440
Implantable Pulse Generator Implant Date: 20220120

## 2022-12-11 DIAGNOSIS — M5451 Vertebrogenic low back pain: Secondary | ICD-10-CM | POA: Diagnosis not present

## 2022-12-12 NOTE — Progress Notes (Signed)
Carelink Summary Report / Loop Recorder 

## 2022-12-14 DIAGNOSIS — M5451 Vertebrogenic low back pain: Secondary | ICD-10-CM | POA: Diagnosis not present

## 2022-12-18 DIAGNOSIS — M5451 Vertebrogenic low back pain: Secondary | ICD-10-CM | POA: Diagnosis not present

## 2023-01-01 DIAGNOSIS — M5451 Vertebrogenic low back pain: Secondary | ICD-10-CM | POA: Diagnosis not present

## 2023-01-03 DIAGNOSIS — M5451 Vertebrogenic low back pain: Secondary | ICD-10-CM | POA: Diagnosis not present

## 2023-01-07 ENCOUNTER — Ambulatory Visit (INDEPENDENT_AMBULATORY_CARE_PROVIDER_SITE_OTHER): Payer: Medicare Other

## 2023-01-07 DIAGNOSIS — I639 Cerebral infarction, unspecified: Secondary | ICD-10-CM | POA: Diagnosis not present

## 2023-01-07 LAB — CUP PACEART REMOTE DEVICE CHECK
Date Time Interrogation Session: 20240421231824
Implantable Pulse Generator Implant Date: 20220120

## 2023-01-10 ENCOUNTER — Ambulatory Visit: Payer: Medicare Other

## 2023-01-14 NOTE — Progress Notes (Signed)
Carelink Summary Report / Loop Recorder 

## 2023-01-16 DIAGNOSIS — M5459 Other low back pain: Secondary | ICD-10-CM | POA: Diagnosis not present

## 2023-01-18 DIAGNOSIS — N3000 Acute cystitis without hematuria: Secondary | ICD-10-CM | POA: Diagnosis not present

## 2023-01-24 DIAGNOSIS — E559 Vitamin D deficiency, unspecified: Secondary | ICD-10-CM | POA: Diagnosis not present

## 2023-01-24 DIAGNOSIS — E7800 Pure hypercholesterolemia, unspecified: Secondary | ICD-10-CM | POA: Diagnosis not present

## 2023-01-24 DIAGNOSIS — K219 Gastro-esophageal reflux disease without esophagitis: Secondary | ICD-10-CM | POA: Diagnosis not present

## 2023-01-24 DIAGNOSIS — K449 Diaphragmatic hernia without obstruction or gangrene: Secondary | ICD-10-CM | POA: Diagnosis not present

## 2023-01-24 DIAGNOSIS — Z79899 Other long term (current) drug therapy: Secondary | ICD-10-CM | POA: Diagnosis not present

## 2023-01-31 DIAGNOSIS — M5459 Other low back pain: Secondary | ICD-10-CM | POA: Diagnosis not present

## 2023-02-01 DIAGNOSIS — M47816 Spondylosis without myelopathy or radiculopathy, lumbar region: Secondary | ICD-10-CM | POA: Diagnosis not present

## 2023-02-01 DIAGNOSIS — M5136 Other intervertebral disc degeneration, lumbar region: Secondary | ICD-10-CM | POA: Diagnosis not present

## 2023-02-12 ENCOUNTER — Ambulatory Visit (INDEPENDENT_AMBULATORY_CARE_PROVIDER_SITE_OTHER): Payer: Medicare Other

## 2023-02-12 DIAGNOSIS — I639 Cerebral infarction, unspecified: Secondary | ICD-10-CM | POA: Diagnosis not present

## 2023-02-12 LAB — CUP PACEART REMOTE DEVICE CHECK
Date Time Interrogation Session: 20240524230927
Implantable Pulse Generator Implant Date: 20220120

## 2023-02-12 NOTE — Progress Notes (Signed)
Carelink Summary Report / Loop Recorder 

## 2023-02-19 DIAGNOSIS — M47816 Spondylosis without myelopathy or radiculopathy, lumbar region: Secondary | ICD-10-CM | POA: Diagnosis not present

## 2023-02-22 DIAGNOSIS — E7800 Pure hypercholesterolemia, unspecified: Secondary | ICD-10-CM | POA: Diagnosis not present

## 2023-02-22 DIAGNOSIS — Z0001 Encounter for general adult medical examination with abnormal findings: Secondary | ICD-10-CM | POA: Diagnosis not present

## 2023-02-22 DIAGNOSIS — I10 Essential (primary) hypertension: Secondary | ICD-10-CM | POA: Diagnosis not present

## 2023-02-22 DIAGNOSIS — Z79899 Other long term (current) drug therapy: Secondary | ICD-10-CM | POA: Diagnosis not present

## 2023-02-22 DIAGNOSIS — D81818 Other biotin-dependent carboxylase deficiency: Secondary | ICD-10-CM | POA: Diagnosis not present

## 2023-02-22 DIAGNOSIS — K219 Gastro-esophageal reflux disease without esophagitis: Secondary | ICD-10-CM | POA: Diagnosis not present

## 2023-02-22 DIAGNOSIS — Z8673 Personal history of transient ischemic attack (TIA), and cerebral infarction without residual deficits: Secondary | ICD-10-CM | POA: Diagnosis not present

## 2023-02-26 DIAGNOSIS — K449 Diaphragmatic hernia without obstruction or gangrene: Secondary | ICD-10-CM | POA: Diagnosis not present

## 2023-02-26 DIAGNOSIS — R1013 Epigastric pain: Secondary | ICD-10-CM | POA: Diagnosis not present

## 2023-02-26 DIAGNOSIS — K317 Polyp of stomach and duodenum: Secondary | ICD-10-CM | POA: Diagnosis not present

## 2023-02-26 DIAGNOSIS — K2930 Chronic superficial gastritis without bleeding: Secondary | ICD-10-CM | POA: Diagnosis not present

## 2023-02-26 DIAGNOSIS — K3189 Other diseases of stomach and duodenum: Secondary | ICD-10-CM | POA: Diagnosis not present

## 2023-02-26 DIAGNOSIS — Q399 Congenital malformation of esophagus, unspecified: Secondary | ICD-10-CM | POA: Diagnosis not present

## 2023-02-28 DIAGNOSIS — K317 Polyp of stomach and duodenum: Secondary | ICD-10-CM | POA: Diagnosis not present

## 2023-02-28 DIAGNOSIS — K2930 Chronic superficial gastritis without bleeding: Secondary | ICD-10-CM | POA: Diagnosis not present

## 2023-03-05 DIAGNOSIS — M47816 Spondylosis without myelopathy or radiculopathy, lumbar region: Secondary | ICD-10-CM | POA: Diagnosis not present

## 2023-03-08 NOTE — Progress Notes (Signed)
Carelink Summary Report / Loop Recorder 

## 2023-03-14 LAB — CUP PACEART REMOTE DEVICE CHECK
Date Time Interrogation Session: 20240626231150
Implantable Pulse Generator Implant Date: 20220120

## 2023-03-18 ENCOUNTER — Ambulatory Visit (INDEPENDENT_AMBULATORY_CARE_PROVIDER_SITE_OTHER): Payer: Medicare Other

## 2023-03-18 DIAGNOSIS — I639 Cerebral infarction, unspecified: Secondary | ICD-10-CM

## 2023-03-27 DIAGNOSIS — M25551 Pain in right hip: Secondary | ICD-10-CM | POA: Diagnosis not present

## 2023-03-27 DIAGNOSIS — M25552 Pain in left hip: Secondary | ICD-10-CM | POA: Diagnosis not present

## 2023-04-04 DIAGNOSIS — B370 Candidal stomatitis: Secondary | ICD-10-CM | POA: Diagnosis not present

## 2023-04-04 DIAGNOSIS — M1612 Unilateral primary osteoarthritis, left hip: Secondary | ICD-10-CM | POA: Diagnosis not present

## 2023-04-04 NOTE — Progress Notes (Signed)
Carelink Summary Report / Loop Recorder 

## 2023-04-19 DIAGNOSIS — M47896 Other spondylosis, lumbar region: Secondary | ICD-10-CM | POA: Diagnosis not present

## 2023-04-19 DIAGNOSIS — M47816 Spondylosis without myelopathy or radiculopathy, lumbar region: Secondary | ICD-10-CM | POA: Diagnosis not present

## 2023-04-22 ENCOUNTER — Ambulatory Visit: Payer: Medicare Other

## 2023-04-22 DIAGNOSIS — I639 Cerebral infarction, unspecified: Secondary | ICD-10-CM

## 2023-04-29 DIAGNOSIS — M25551 Pain in right hip: Secondary | ICD-10-CM | POA: Diagnosis not present

## 2023-04-29 DIAGNOSIS — M1612 Unilateral primary osteoarthritis, left hip: Secondary | ICD-10-CM | POA: Diagnosis not present

## 2023-05-02 DIAGNOSIS — M1712 Unilateral primary osteoarthritis, left knee: Secondary | ICD-10-CM | POA: Diagnosis not present

## 2023-05-06 NOTE — Progress Notes (Signed)
Carelink Summary Report / Loop Recorder 

## 2023-05-09 DIAGNOSIS — M1712 Unilateral primary osteoarthritis, left knee: Secondary | ICD-10-CM | POA: Diagnosis not present

## 2023-05-13 DIAGNOSIS — M47896 Other spondylosis, lumbar region: Secondary | ICD-10-CM | POA: Diagnosis not present

## 2023-05-13 DIAGNOSIS — M5136 Other intervertebral disc degeneration, lumbar region: Secondary | ICD-10-CM | POA: Diagnosis not present

## 2023-05-14 DIAGNOSIS — R300 Dysuria: Secondary | ICD-10-CM | POA: Diagnosis not present

## 2023-05-14 DIAGNOSIS — N3000 Acute cystitis without hematuria: Secondary | ICD-10-CM | POA: Diagnosis not present

## 2023-05-16 DIAGNOSIS — M1712 Unilateral primary osteoarthritis, left knee: Secondary | ICD-10-CM | POA: Diagnosis not present

## 2023-05-17 DIAGNOSIS — Z23 Encounter for immunization: Secondary | ICD-10-CM | POA: Diagnosis not present

## 2023-05-27 ENCOUNTER — Other Ambulatory Visit: Payer: Self-pay | Admitting: Family Medicine

## 2023-05-27 ENCOUNTER — Ambulatory Visit
Admission: RE | Admit: 2023-05-27 | Discharge: 2023-05-27 | Disposition: A | Discharge status: Home / Self Care | Payer: Medicare Other | Source: Ambulatory Visit | Attending: Family Medicine | Admitting: Family Medicine

## 2023-05-27 ENCOUNTER — Ambulatory Visit (INDEPENDENT_AMBULATORY_CARE_PROVIDER_SITE_OTHER): Payer: Medicare Other

## 2023-05-27 DIAGNOSIS — I10 Essential (primary) hypertension: Secondary | ICD-10-CM | POA: Diagnosis not present

## 2023-05-27 DIAGNOSIS — E7800 Pure hypercholesterolemia, unspecified: Secondary | ICD-10-CM | POA: Diagnosis not present

## 2023-05-27 DIAGNOSIS — Z79899 Other long term (current) drug therapy: Secondary | ICD-10-CM | POA: Diagnosis not present

## 2023-05-27 DIAGNOSIS — Z01818 Encounter for other preprocedural examination: Secondary | ICD-10-CM | POA: Diagnosis not present

## 2023-05-27 DIAGNOSIS — Z01811 Encounter for preprocedural respiratory examination: Secondary | ICD-10-CM

## 2023-05-27 DIAGNOSIS — I639 Cerebral infarction, unspecified: Secondary | ICD-10-CM | POA: Diagnosis not present

## 2023-05-27 LAB — CUP PACEART REMOTE DEVICE CHECK
Date Time Interrogation Session: 20240906230614
Implantable Pulse Generator Implant Date: 20220120

## 2023-06-13 DIAGNOSIS — M25511 Pain in right shoulder: Secondary | ICD-10-CM | POA: Diagnosis not present

## 2023-06-13 NOTE — Progress Notes (Signed)
Carelink Summary Report / Loop Recorder 

## 2023-07-01 ENCOUNTER — Ambulatory Visit (INDEPENDENT_AMBULATORY_CARE_PROVIDER_SITE_OTHER): Payer: Medicare Other

## 2023-07-01 DIAGNOSIS — I639 Cerebral infarction, unspecified: Secondary | ICD-10-CM

## 2023-07-01 NOTE — Patient Instructions (Signed)
DUE TO COVID-19 ONLY TWO VISITORS  (aged 80 and older)  ARE ALLOWED TO COME WITH YOU AND STAY IN THE WAITING ROOM ONLY DURING PRE OP AND PROCEDURE.   **NO VISITORS ARE ALLOWED IN THE SHORT STAY AREA OR RECOVERY ROOM!!**  IF YOU WILL BE ADMITTED INTO THE HOSPITAL YOU ARE ALLOWED ONLY FOUR SUPPORT PEOPLE DURING VISITATION HOURS ONLY (7 AM -8PM)   The support person(s) must pass our screening, gel in and out, and wear a mask at all times, including in the patient's room. Patients must also wear a mask when staff or their support person are in the room. Visitors GUEST BADGE MUST BE WORN VISIBLY  One adult visitor may remain with you overnight and MUST be in the room by 8 P.M.     Your procedure is scheduled on: 07/09/23   Report to [facility] Main Entrance    Report to admitting at : 9:00 AM   Call this number if you have problems the morning of surgery [phone]   Do not eat food :After Midnight.   After Midnight you may have the following liquids until : 8:30 AM DAY OF SURGERY  Water Black Coffee (sugar ok, NO MILK/CREAM OR CREAMERS)  Tea (sugar ok, NO MILK/CREAM OR CREAMERS) regular and decaf                             Plain Jell-O (NO RED)                                           Fruit ices (not with fruit pulp, NO RED)                                     Popsicles (NO RED)                                                                  Juice: apple, WHITE grape, WHITE cranberry Sports drinks like Gatorade (NO RED)   The day of surgery:  Drink ONE (1) Pre-Surgery Clear Ensure at : 8:30 AM the morning of surgery. Drink in one sitting. Do not sip.  This drink was given to you during your hospital  pre-op appointment visit. Nothing else to drink after completing the  Pre-Surgery Clear Ensure or G2.          If you have questions, please contact your surgeon's office.  FOLLOW ANY ADDITIONAL PRE OP INSTRUCTIONS YOU RECEIVED FROM YOUR SURGEON'S OFFICE!!!   Oral  Hygiene is also important to reduce your risk of infection.                                    Remember - BRUSH YOUR TEETH THE MORNING OF SURGERY WITH YOUR REGULAR TOOTHPASTE  DENTURES WILL BE REMOVED PRIOR TO SURGERY PLEASE DO NOT APPLY "Poly grip" OR ADHESIVES!!!   Do NOT smoke after Midnight   Take these medicines the morning of surgery  with A SIP OF WATER: amlodipine,pantoprazole.                              You may not have any metal on your body including hair pins, jewelry, and body piercing             Do not wear make-up, lotions, powders, perfumes/cologne, or deodorant  Do not wear nail polish including gel and S&S, artificial/acrylic nails, or any other type of covering on natural nails including finger and toenails. If you have artificial nails, gel coating, etc. that needs to be removed by a nail salon please have this removed prior to surgery or surgery may need to be canceled/ delayed if the surgeon/ anesthesia feels like they are unable to be safely monitored.   Do not shave  48 hours prior to surgery.    Do not bring valuables to the hospital. [facility] IS NOT             RESPONSIBLE   FOR VALUABLES.   Contacts, glasses, or bridgework may not be worn into surgery.   Bring small overnight bag day of surgery.   DO NOT BRING YOUR HOME MEDICATIONS TO THE HOSPITAL. PHARMACY WILL DISPENSE MEDICATIONS LISTED ON YOUR MEDICATION LIST TO YOU DURING YOUR ADMISSION IN THE HOSPITAL!    Patients discharged on the day of surgery will not be allowed to drive home.  Someone NEEDS to stay with you for the first 24 hours after anesthesia.   Special Instructions: Bring a copy of your healthcare power of attorney and living will documents         the day of surgery if you haven't scanned them before.              Please read over the following fact sheets you were given: IF YOU HAVE QUESTIONS ABOUT YOUR PRE-OP INSTRUCTIONS PLEASE CALL [phone]      Pre-operative 5 CHG Bath  Instructions   You can play a key role in reducing the risk of infection after surgery. Your skin needs to be as free of germs as possible. You can reduce the number of germs on your skin by washing with CHG (chlorhexidine gluconate) soap before surgery. CHG is an antiseptic soap that kills germs and continues to kill germs even after washing.   DO NOT use if you have an allergy to chlorhexidine/CHG or antibacterial soaps. If your skin becomes reddened or irritated, stop using the CHG and notify one of our RNs at : [phone].   Please shower with the CHG soap starting 4 days before surgery using the following schedule:     Please keep in mind the following:  DO NOT shave, including legs and underarms, starting the day of your first shower.   You may shave your face at any point before/day of surgery.  Place clean sheets on your bed the day you start using CHG soap. Use a clean washcloth (not used since being washed) for each shower. DO NOT sleep with pets once you start using the CHG.   CHG Shower Instructions:  If you choose to wash your hair and private area, wash first with your normal shampoo/soap.  After you use shampoo/soap, rinse your hair and body thoroughly to remove shampoo/soap residue.  Turn the water OFF and apply about 3 tablespoons (45 ml) of CHG soap to a CLEAN washcloth.  Apply CHG soap ONLY FROM YOUR NECK DOWN  TO YOUR TOES (washing for 3-5 minutes)  DO NOT use CHG soap on face, private areas, open wounds, or sores.  Pay special attention to the area where your surgery is being performed.  If you are having back surgery, having someone wash your back for you may be helpful. Wait 2 minutes after CHG soap is applied, then you may rinse off the CHG soap.  Pat dry with a clean towel  Put on clean clothes/pajamas   If you choose to wear lotion, please use ONLY the CHG-compatible lotions on the back of this paper.     Additional instructions for the day of surgery: DO NOT  APPLY any lotions, deodorants, cologne, or perfumes.   Put on clean/comfortable clothes.  Brush your teeth.  Ask your nurse before applying any prescription medications to the skin.   CHG Compatible Lotions   Aveeno Moisturizing lotion  Cetaphil Moisturizing Cream  Cetaphil Moisturizing Lotion  Clairol Herbal Essence Moisturizing Lotion, Dry Skin  Clairol Herbal Essence Moisturizing Lotion, Extra Dry Skin  Clairol Herbal Essence Moisturizing Lotion, Normal Skin  Curel Age Defying Therapeutic Moisturizing Lotion with Alpha Hydroxy  Curel Extreme Care Body Lotion  Curel Soothing Hands Moisturizing Hand Lotion  Curel Therapeutic Moisturizing Cream, Fragrance-Free  Curel Therapeutic Moisturizing Lotion, Fragrance-Free  Curel Therapeutic Moisturizing Lotion, Original Formula  Eucerin Daily Replenishing Lotion  Eucerin Dry Skin Therapy Plus Alpha Hydroxy Crme  Eucerin Dry Skin Therapy Plus Alpha Hydroxy Lotion  Eucerin Original Crme  Eucerin Original Lotion  Eucerin Plus Crme Eucerin Plus Lotion  Eucerin TriLipid Replenishing Lotion  Keri Anti-Bacterial Hand Lotion  Keri Deep Conditioning Original Lotion Dry Skin Formula Softly Scented  Keri Deep Conditioning Original Lotion, Fragrance Free Sensitive Skin Formula  Keri Lotion Fast Absorbing Fragrance Free Sensitive Skin Formula  Keri Lotion Fast Absorbing Softly Scented Dry Skin Formula  Keri Original Lotion  Keri Skin Renewal Lotion Keri Silky Smooth Lotion  Keri Silky Smooth Sensitive Skin Lotion  Nivea Body Creamy Conditioning Oil  Nivea Body Extra Enriched Lotion  Nivea Body Original Lotion  Nivea Body Sheer Moisturizing Lotion Nivea Crme  Nivea Skin Firming Lotion  NutraDerm 30 Skin Lotion  NutraDerm Skin Lotion  NutraDerm Therapeutic Skin Cream  NutraDerm Therapeutic Skin Lotion  ProShield Protective Hand Cream  Provon moisturizing lotion   Incentive Spirometer  An incentive spirometer is a tool that can help keep  your lungs clear and active. This tool measures how well you are filling your lungs with each breath. Taking long deep breaths may help reverse or decrease the chance of developing breathing (pulmonary) problems (especially infection) following: A long period of time when you are unable to move or be active. BEFORE THE PROCEDURE  If the spirometer includes an indicator to show your best effort, your nurse or respiratory therapist will set it to a desired goal. If possible, sit up straight or lean slightly forward. Try not to slouch. Hold the incentive spirometer in an upright position. INSTRUCTIONS FOR USE  Sit on the edge of your bed if possible, or sit up as far as you can in bed or on a chair. Hold the incentive spirometer in an upright position. Breathe out normally. Place the mouthpiece in your mouth and seal your lips tightly around it. Breathe in slowly and as deeply as possible, raising the piston or the ball toward the top of the column. Hold your breath for 3-5 seconds or for as long as possible. Allow the piston or ball to fall  to the bottom of the column. Remove the mouthpiece from your mouth and breathe out normally. Rest for a few seconds and repeat Steps 1 through 7 at least 10 times every 1-2 hours when you are awake. Take your time and take a few normal breaths between deep breaths. The spirometer may include an indicator to show your best effort. Use the indicator as a goal to work toward during each repetition. After each set of 10 deep breaths, practice coughing to be sure your lungs are clear. If you have an incision (the cut made at the time of surgery), support your incision when coughing by placing a pillow or rolled up towels firmly against it. Once you are able to get out of bed, walk around indoors and cough well. You may stop using the incentive spirometer when instructed by your caregiver.  RISKS AND COMPLICATIONS Take your time so you do not get dizzy or  light-headed. If you are in pain, you may need to take or ask for pain medication before doing incentive spirometry. It is harder to take a deep breath if you are having pain. AFTER USE Rest and breathe slowly and easily. It can be helpful to keep track of a log of your progress. Your caregiver can provide you with a simple table to help with this. If you are using the spirometer at home, follow these instructions: SEEK MEDICAL CARE IF:  You are having difficultly using the spirometer. You have trouble using the spirometer as often as instructed. Your pain medication is not giving enough relief while using the spirometer. You develop fever of 100.5 F (38.1 C) or higher. SEEK IMMEDIATE MEDICAL CARE IF:  You cough up bloody sputum that had not been present before. You develop fever of 102 F (38.9 C) or greater. You develop worsening pain at or near the incision site. MAKE SURE YOU:  Understand these instructions. Will watch your condition. Will get help right away if you are not doing well or get worse. Document Released: 01/14/2007 Document Revised: 11/26/2011 Document Reviewed: 03/17/2007 [facility] Patient Information 2014 [place], [place].   ________________________________________________________________________

## 2023-07-02 ENCOUNTER — Encounter (HOSPITAL_COMMUNITY)
Admission: RE | Admit: 2023-07-02 | Discharge: 2023-07-02 | Disposition: A | Discharge status: Home / Self Care | Payer: No Typology Code available for payment source | Source: Ambulatory Visit | Attending: Orthopedic Surgery

## 2023-07-02 ENCOUNTER — Other Ambulatory Visit: Payer: Self-pay

## 2023-07-02 ENCOUNTER — Encounter (HOSPITAL_COMMUNITY): Payer: Self-pay

## 2023-07-02 VITALS — BP 149/73 | HR 95 | Temp 97.8°F | Ht 66.0 in | Wt 138.0 lb

## 2023-07-02 DIAGNOSIS — M1612 Unilateral primary osteoarthritis, left hip: Secondary | ICD-10-CM | POA: Diagnosis not present

## 2023-07-02 DIAGNOSIS — I10 Essential (primary) hypertension: Secondary | ICD-10-CM | POA: Diagnosis not present

## 2023-07-02 DIAGNOSIS — Z01818 Encounter for other preprocedural examination: Secondary | ICD-10-CM | POA: Diagnosis not present

## 2023-07-02 HISTORY — DX: Other specified postprocedural states: Z98.890

## 2023-07-02 HISTORY — DX: Cerebral infarction, unspecified: I63.9

## 2023-07-02 HISTORY — DX: Malignant (primary) neoplasm, unspecified: C80.1

## 2023-07-02 LAB — SURGICAL PCR SCREEN
MRSA, PCR: NEGATIVE
Staphylococcus aureus: NEGATIVE

## 2023-07-02 LAB — BASIC METABOLIC PANEL
Anion gap: 10 (ref 5–15)
BUN: 22 mg/dL (ref 8–23)
CO2: 26 mmol/L (ref 22–32)
Calcium: 9.5 mg/dL (ref 8.9–10.3)
Chloride: 102 mmol/L (ref 98–111)
Creatinine, Ser: 0.91 mg/dL (ref 0.44–1.00)
GFR, Estimated: >60 mL/min (ref >60)
Glucose, Bld: 106 mg/dL — ABNORMAL HIGH (ref 70–99)
Potassium: 3.3 mmol/L — ABNORMAL LOW (ref 3.5–5.1)
Sodium: 138 mmol/L (ref 135–145)

## 2023-07-02 LAB — CBC
HCT: 43.3 % (ref 36.0–46.0)
Hemoglobin: 14.4 g/dL (ref 12.0–15.0)
MCH: 31.2 pg (ref 26.0–34.0)
MCHC: 33.3 g/dL (ref 30.0–36.0)
MCV: 93.9 fL (ref 80.0–100.0)
Platelets: 219 10*3/uL (ref 150–400)
RBC: 4.61 MIL/uL (ref 3.87–5.11)
RDW: 12.4 % (ref 11.5–15.5)
WBC: 7.7 10*3/uL (ref 4.0–10.5)
nRBC: 0 % (ref 0.0–0.2)

## 2023-07-02 LAB — CUP PACEART REMOTE DEVICE CHECK
Date Time Interrogation Session: 20241013231824
Implantable Pulse Generator Implant Date: 20220120

## 2023-07-02 NOTE — Progress Notes (Addendum)
For Short Stay: COVID SWAB appointment date:  Bowel Prep reminder:   For Anesthesia: PCP - Darrow Bussing, MD . Clearance: 05/27/23 Cardiologist - N/A Sheria Lang T. Lalla Brothers, MD, [facility], [facility] Cardiac Electrophysiology Chest x-ray -  EKG - 06/06/23 Stress Test -  ECHO - 10/05/20 Cardiac Cath -  Pacemaker/ICD device last checked: Pacemaker orders received: Device Rep notified:  Spinal Cord Stimulator: N/A  Sleep Study - N/A CPAP -   Fasting Blood Sugar - N/A Checks Blood Sugar _____ times a day Date and result of last Hgb A1c-  Last dose of GLP1 agonist- N/A GLP1 instructions:   Last dose of SGLT-2 inhibitors- N/A SGLT-2 instructions:   Blood Thinner Instructions: Plavix on hold since: 07/01/23 Aspirin Instructions: Last Dose:  Activity level: Can go up a flight of stairs and activities of daily living without stopping and without chest pain and/or shortness of breath   Able to exercise without chest pain and/or shortness of breath  Anesthesia review: Hx: HTN,Afib,Stroke(09/2020),Loop recorder.  Patient denies shortness of breath, fever, cough and chest pain at PAT appointment   Patient verbalized understanding of instructions that were given to them at the PAT appointment. Patient was also instructed that they will need to review over the PAT instructions again at home before surgery.

## 2023-07-09 ENCOUNTER — Ambulatory Visit (HOSPITAL_COMMUNITY): Payer: Medicare Other

## 2023-07-09 ENCOUNTER — Encounter (HOSPITAL_COMMUNITY)
Admission: RE | Disposition: A | Discharge status: Home / Self Care | Payer: Self-pay | Source: Ambulatory Visit | Attending: Orthopedic Surgery

## 2023-07-09 ENCOUNTER — Observation Stay (HOSPITAL_COMMUNITY): Payer: Medicare Other

## 2023-07-09 ENCOUNTER — Observation Stay (HOSPITAL_COMMUNITY)
Admission: RE | Admit: 2023-07-09 | Discharge: 2023-07-11 | Disposition: A | Discharge status: Home / Self Care | Payer: Medicare Other | Source: Ambulatory Visit | Attending: Orthopedic Surgery | Admitting: Orthopedic Surgery

## 2023-07-09 ENCOUNTER — Other Ambulatory Visit: Payer: Self-pay

## 2023-07-09 ENCOUNTER — Ambulatory Visit (HOSPITAL_COMMUNITY): Payer: Medicare Other | Admitting: Physician Assistant

## 2023-07-09 ENCOUNTER — Encounter (HOSPITAL_COMMUNITY): Payer: Self-pay | Admitting: Orthopedic Surgery

## 2023-07-09 ENCOUNTER — Ambulatory Visit (HOSPITAL_BASED_OUTPATIENT_CLINIC_OR_DEPARTMENT_OTHER): Payer: Medicare Other | Admitting: Anesthesiology

## 2023-07-09 DIAGNOSIS — Z7902 Long term (current) use of antithrombotics/antiplatelets: Secondary | ICD-10-CM | POA: Insufficient documentation

## 2023-07-09 DIAGNOSIS — M1612 Unilateral primary osteoarthritis, left hip: Principal | ICD-10-CM | POA: Insufficient documentation

## 2023-07-09 DIAGNOSIS — Z85828 Personal history of other malignant neoplasm of skin: Secondary | ICD-10-CM | POA: Diagnosis not present

## 2023-07-09 DIAGNOSIS — I10 Essential (primary) hypertension: Secondary | ICD-10-CM

## 2023-07-09 DIAGNOSIS — Z96642 Presence of left artificial hip joint: Secondary | ICD-10-CM

## 2023-07-09 DIAGNOSIS — Z471 Aftercare following joint replacement surgery: Secondary | ICD-10-CM | POA: Diagnosis not present

## 2023-07-09 DIAGNOSIS — Z79899 Other long term (current) drug therapy: Secondary | ICD-10-CM | POA: Insufficient documentation

## 2023-07-09 DIAGNOSIS — Z8673 Personal history of transient ischemic attack (TIA), and cerebral infarction without residual deficits: Secondary | ICD-10-CM | POA: Insufficient documentation

## 2023-07-09 DIAGNOSIS — Z96651 Presence of right artificial knee joint: Secondary | ICD-10-CM | POA: Diagnosis not present

## 2023-07-09 HISTORY — PX: TOTAL HIP ARTHROPLASTY: SHX124

## 2023-07-09 LAB — TYPE AND SCREEN
ABO/RH(D): A NEG
Antibody Screen: NEGATIVE

## 2023-07-09 LAB — ABO/RH: ABO/RH(D): A NEG

## 2023-07-09 SURGERY — ARTHROPLASTY, HIP, TOTAL, ANTERIOR APPROACH
Anesthesia: Spinal | Site: Hip | Laterality: Left

## 2023-07-09 MED ORDER — MELOXICAM 15 MG PO TABS
15.0000 mg | ORAL_TABLET | Freq: Every day | ORAL | Status: DC
  Administered 2023-07-10 – 2023-07-11 (×2): 15 mg via ORAL
  Filled 2023-07-09 (×2): qty 1

## 2023-07-09 MED ORDER — PROPOFOL 1000 MG/100ML IV EMUL
INTRAVENOUS | Status: AC
  Filled 2023-07-09: qty 100

## 2023-07-09 MED ORDER — TRAMADOL HCL 50 MG PO TABS
50.0000 mg | ORAL_TABLET | Freq: Four times a day (QID) | ORAL | Status: DC | PRN
  Administered 2023-07-11: 50 mg via ORAL
  Filled 2023-07-09: qty 2
  Filled 2023-07-09: qty 1

## 2023-07-09 MED ORDER — ROSUVASTATIN CALCIUM 20 MG PO TABS
40.0000 mg | ORAL_TABLET | Freq: Every evening | ORAL | Status: DC
  Administered 2023-07-09 – 2023-07-10 (×2): 40 mg via ORAL
  Filled 2023-07-09 (×2): qty 2

## 2023-07-09 MED ORDER — ALBUMIN HUMAN 5 % IV SOLN: INTRAVENOUS | Status: DC | PRN

## 2023-07-09 MED ORDER — CEFAZOLIN SODIUM-DEXTROSE 2-4 GM/100ML-% IV SOLN
2.0000 g | Freq: Four times a day (QID) | INTRAVENOUS | Status: AC
  Administered 2023-07-09 (×2): 2 g via INTRAVENOUS
  Filled 2023-07-09 (×2): qty 100

## 2023-07-09 MED ORDER — PHENOL 1.4 % MT LIQD: 1.0000 | OROMUCOSAL | Status: DC | PRN

## 2023-07-09 MED ORDER — 0.9 % SODIUM CHLORIDE (POUR BTL) OPTIME
TOPICAL | Status: DC | PRN
  Administered 2023-07-09: 1000 mL

## 2023-07-09 MED ORDER — TRIAMTERENE-HCTZ 37.5-25 MG PO TABS
1.0000 | ORAL_TABLET | Freq: Every day | ORAL | Status: DC
  Administered 2023-07-10 – 2023-07-11 (×2): 1 via ORAL
  Filled 2023-07-09 (×2): qty 1

## 2023-07-09 MED ORDER — DEXAMETHASONE SODIUM PHOSPHATE 10 MG/ML IJ SOLN: 8.0000 mg | Freq: Once | INTRAMUSCULAR | Status: DC

## 2023-07-09 MED ORDER — LACTATED RINGERS IV SOLN: INTRAVENOUS | Status: DC

## 2023-07-09 MED ORDER — CLOPIDOGREL BISULFATE 75 MG PO TABS
75.0000 mg | ORAL_TABLET | Freq: Every day | ORAL | Status: DC
  Administered 2023-07-10 – 2023-07-11 (×2): 75 mg via ORAL
  Filled 2023-07-09 (×2): qty 1

## 2023-07-09 MED ORDER — CHLORHEXIDINE GLUCONATE 0.12 % MT SOLN
15.0000 mL | Freq: Once | OROMUCOSAL | Status: AC
  Administered 2023-07-09: 15 mL via OROMUCOSAL

## 2023-07-09 MED ORDER — FENTANYL CITRATE (PF) 100 MCG/2ML IJ SOLN
INTRAMUSCULAR | Status: DC | PRN
  Administered 2023-07-09: 100 ug via INTRAVENOUS

## 2023-07-09 MED ORDER — METHOCARBAMOL 1000 MG/10ML IJ SOLN: 500.0000 mg | Freq: Four times a day (QID) | INTRAMUSCULAR | Status: DC | PRN

## 2023-07-09 MED ORDER — POLYETHYLENE GLYCOL 3350 17 G PO PACK
17.0000 g | PACK | Freq: Two times a day (BID) | ORAL | Status: DC
  Administered 2023-07-09 – 2023-07-11 (×4): 17 g via ORAL
  Filled 2023-07-09 (×3): qty 1

## 2023-07-09 MED ORDER — KETOROLAC TROMETHAMINE 30 MG/ML IJ SOLN
INTRAMUSCULAR | Status: DC | PRN
  Administered 2023-07-09: 30 mg

## 2023-07-09 MED ORDER — PROPOFOL 10 MG/ML IV BOLUS
INTRAVENOUS | Status: AC
  Filled 2023-07-09: qty 20

## 2023-07-09 MED ORDER — STERILE WATER FOR IRRIGATION IR SOLN
Status: DC | PRN
  Administered 2023-07-09: 1000 mL

## 2023-07-09 MED ORDER — PROPOFOL 10 MG/ML IV BOLUS
INTRAVENOUS | Status: DC | PRN
  Administered 2023-07-09 (×3): 20 mg via INTRAVENOUS
  Administered 2023-07-09: 10 mg via INTRAVENOUS
  Administered 2023-07-09: 20 mg via INTRAVENOUS

## 2023-07-09 MED ORDER — ACETAMINOPHEN 500 MG PO TABS
ORAL_TABLET | ORAL | Status: AC
  Filled 2023-07-09: qty 2

## 2023-07-09 MED ORDER — TRANEXAMIC ACID-NACL 1000-0.7 MG/100ML-% IV SOLN
1000.0000 mg | INTRAVENOUS | Status: AC
  Administered 2023-07-09: 1000 mg via INTRAVENOUS
  Filled 2023-07-09: qty 100

## 2023-07-09 MED ORDER — HYDROMORPHONE HCL 1 MG/ML IJ SOLN
0.5000 mg | INTRAMUSCULAR | Status: DC | PRN
  Administered 2023-07-09: 1 mg via INTRAVENOUS

## 2023-07-09 MED ORDER — ORAL CARE MOUTH RINSE: 15.0000 mL | Freq: Once | OROMUCOSAL | Status: AC

## 2023-07-09 MED ORDER — SULFAMETHOXAZOLE-TRIMETHOPRIM 400-80 MG PO TABS
0.5000 | ORAL_TABLET | Freq: Every evening | ORAL | Status: DC
  Administered 2023-07-09 – 2023-07-10 (×2): 0.5 via ORAL
  Filled 2023-07-09 (×2): qty 1

## 2023-07-09 MED ORDER — ALUM & MAG HYDROXIDE-SIMETH 200-200-20 MG/5ML PO SUSP: 30.0000 mL | ORAL | Status: DC | PRN

## 2023-07-09 MED ORDER — ALBUMIN HUMAN 5 % IV SOLN
INTRAVENOUS | Status: AC
  Filled 2023-07-09: qty 250

## 2023-07-09 MED ORDER — DEXAMETHASONE SODIUM PHOSPHATE 10 MG/ML IJ SOLN
10.0000 mg | Freq: Once | INTRAMUSCULAR | Status: AC
  Administered 2023-07-10: 10 mg via INTRAVENOUS
  Filled 2023-07-09: qty 1

## 2023-07-09 MED ORDER — PHENYLEPHRINE HCL-NACL 20-0.9 MG/250ML-% IV SOLN
INTRAVENOUS | Status: DC | PRN
  Administered 2023-07-09: 35 ug/min via INTRAVENOUS

## 2023-07-09 MED ORDER — METOCLOPRAMIDE HCL 5 MG/ML IJ SOLN: 5.0000 mg | Freq: Three times a day (TID) | INTRAMUSCULAR | Status: DC | PRN

## 2023-07-09 MED ORDER — TRANEXAMIC ACID-NACL 1000-0.7 MG/100ML-% IV SOLN: 1000.0000 mg | Freq: Once | INTRAVENOUS | Status: DC

## 2023-07-09 MED ORDER — AMLODIPINE BESYLATE 10 MG PO TABS
10.0000 mg | ORAL_TABLET | Freq: Every day | ORAL | Status: DC
  Administered 2023-07-10 – 2023-07-11 (×2): 10 mg via ORAL
  Filled 2023-07-09 (×2): qty 1

## 2023-07-09 MED ORDER — DIPHENHYDRAMINE HCL 12.5 MG/5ML PO ELIX: 12.5000 mg | ORAL_SOLUTION | ORAL | Status: DC | PRN

## 2023-07-09 MED ORDER — ONDANSETRON HCL 4 MG/2ML IJ SOLN
INTRAMUSCULAR | Status: AC
  Filled 2023-07-09: qty 2

## 2023-07-09 MED ORDER — PANTOPRAZOLE SODIUM 40 MG PO TBEC
40.0000 mg | DELAYED_RELEASE_TABLET | Freq: Every day | ORAL | Status: DC
  Administered 2023-07-10 – 2023-07-11 (×2): 40 mg via ORAL
  Filled 2023-07-09 (×2): qty 1

## 2023-07-09 MED ORDER — DEXAMETHASONE SODIUM PHOSPHATE 10 MG/ML IJ SOLN
INTRAMUSCULAR | Status: DC | PRN
  Administered 2023-07-09: 10 mg via INTRAVENOUS

## 2023-07-09 MED ORDER — SODIUM CHLORIDE (PF) 0.9 % IJ SOLN
INTRAMUSCULAR | Status: AC
  Filled 2023-07-09: qty 30

## 2023-07-09 MED ORDER — SENNA 8.6 MG PO TABS
2.0000 | ORAL_TABLET | Freq: Every day | ORAL | Status: DC
  Administered 2023-07-09 – 2023-07-10 (×2): 17.2 mg via ORAL
  Filled 2023-07-09: qty 2

## 2023-07-09 MED ORDER — BISACODYL 10 MG RE SUPP: 10.0000 mg | Freq: Every day | RECTAL | Status: DC | PRN

## 2023-07-09 MED ORDER — BUPIVACAINE-EPINEPHRINE (PF) 0.25% -1:200000 IJ SOLN
INTRAMUSCULAR | Status: DC | PRN
  Administered 2023-07-09: 30 mL via PERINEURAL

## 2023-07-09 MED ORDER — DROPERIDOL 2.5 MG/ML IJ SOLN: 0.6250 mg | Freq: Once | INTRAMUSCULAR | Status: DC | PRN

## 2023-07-09 MED ORDER — SODIUM CHLORIDE (PF) 0.9 % IJ SOLN
INTRAMUSCULAR | Status: DC | PRN
  Administered 2023-07-09: 30 mL

## 2023-07-09 MED ORDER — HYDROMORPHONE HCL 2 MG PO TABS
1.0000 mg | ORAL_TABLET | ORAL | Status: DC | PRN
  Administered 2023-07-09 – 2023-07-10 (×2): 2 mg via ORAL
  Filled 2023-07-09 (×2): qty 1

## 2023-07-09 MED ORDER — ACETAMINOPHEN 500 MG PO TABS
1000.0000 mg | ORAL_TABLET | Freq: Four times a day (QID) | ORAL | Status: DC
  Administered 2023-07-09 – 2023-07-11 (×8): 1000 mg via ORAL
  Filled 2023-07-09 (×7): qty 2

## 2023-07-09 MED ORDER — METHOCARBAMOL 500 MG PO TABS
500.0000 mg | ORAL_TABLET | Freq: Four times a day (QID) | ORAL | Status: DC | PRN
  Administered 2023-07-09 – 2023-07-11 (×4): 500 mg via ORAL
  Filled 2023-07-09 (×3): qty 1

## 2023-07-09 MED ORDER — POTASSIUM CHLORIDE CRYS ER 20 MEQ PO TBCR: 20.0000 meq | EXTENDED_RELEASE_TABLET | Freq: Every day | ORAL | Status: DC

## 2023-07-09 MED ORDER — ONDANSETRON HCL 4 MG PO TABS: 4.0000 mg | ORAL_TABLET | Freq: Four times a day (QID) | ORAL | Status: DC | PRN

## 2023-07-09 MED ORDER — METOCLOPRAMIDE HCL 5 MG PO TABS
5.0000 mg | ORAL_TABLET | Freq: Three times a day (TID) | ORAL | Status: DC | PRN
Start: 2023-07-09 — End: 2023-07-11

## 2023-07-09 MED ORDER — BUPIVACAINE-EPINEPHRINE 0.25% -1:200000 IJ SOLN
INTRAMUSCULAR | Status: AC
  Filled 2023-07-09: qty 1

## 2023-07-09 MED ORDER — BUPIVACAINE IN DEXTROSE 0.75-8.25 % IT SOLN
INTRATHECAL | Status: DC | PRN
  Administered 2023-07-09: 1.6 mL via INTRATHECAL

## 2023-07-09 MED ORDER — HYDROMORPHONE HCL 1 MG/ML IJ SOLN: 0.2500 mg | INTRAMUSCULAR | Status: DC | PRN

## 2023-07-09 MED ORDER — POVIDONE-IODINE 10 % EX SWAB: 2.0000 | Freq: Once | CUTANEOUS | Status: DC

## 2023-07-09 MED ORDER — METHOCARBAMOL 500 MG PO TABS
ORAL_TABLET | ORAL | Status: AC
  Filled 2023-07-09: qty 1

## 2023-07-09 MED ORDER — KETOROLAC TROMETHAMINE 30 MG/ML IJ SOLN
INTRAMUSCULAR | Status: AC
  Filled 2023-07-09: qty 1

## 2023-07-09 MED ORDER — CEFAZOLIN SODIUM-DEXTROSE 2-4 GM/100ML-% IV SOLN
2.0000 g | INTRAVENOUS | Status: AC
  Administered 2023-07-09: 2 g via INTRAVENOUS
  Filled 2023-07-09: qty 100

## 2023-07-09 MED ORDER — FENTANYL CITRATE (PF) 100 MCG/2ML IJ SOLN
INTRAMUSCULAR | Status: AC
  Filled 2023-07-09: qty 2

## 2023-07-09 MED ORDER — ONDANSETRON HCL 4 MG/2ML IJ SOLN
4.0000 mg | Freq: Four times a day (QID) | INTRAMUSCULAR | Status: DC | PRN
  Administered 2023-07-09: 4 mg via INTRAVENOUS
  Filled 2023-07-09: qty 2

## 2023-07-09 MED ORDER — MENTHOL 3 MG MT LOZG: 1.0000 | LOZENGE | OROMUCOSAL | Status: DC | PRN

## 2023-07-09 MED ORDER — DEXAMETHASONE SODIUM PHOSPHATE 10 MG/ML IJ SOLN
INTRAMUSCULAR | Status: AC
  Filled 2023-07-09: qty 1

## 2023-07-09 MED ORDER — PROPOFOL 500 MG/50ML IV EMUL
INTRAVENOUS | Status: DC | PRN
  Administered 2023-07-09: 50 ug/kg/min via INTRAVENOUS

## 2023-07-09 MED ORDER — HYDROMORPHONE HCL 1 MG/ML IJ SOLN
INTRAMUSCULAR | Status: AC
  Filled 2023-07-09: qty 1

## 2023-07-09 SURGICAL SUPPLY — 44 items
ADH SKN CLS APL DERMABOND .7 (GAUZE/BANDAGES/DRESSINGS) ×1
BAG COUNTER SPONGE SURGICOUNT (BAG) IMPLANT
BAG SPEC THK2 15X12 ZIP CLS (MISCELLANEOUS)
BAG SPNG CNTER NS LX DISP (BAG)
BAG ZIPLOCK 12X15 (MISCELLANEOUS) IMPLANT
BLADE SAG 18X100X1.27 (BLADE) ×2 IMPLANT
COVER PERINEAL POST (MISCELLANEOUS) ×2 IMPLANT
COVER SURGICAL LIGHT HANDLE (MISCELLANEOUS) ×2 IMPLANT
CUP ACET PINNACLE SECTR 50MM (Hips) IMPLANT
DERMABOND ADVANCED .7 DNX12 (GAUZE/BANDAGES/DRESSINGS) ×2 IMPLANT
DRAPE FOOT SWITCH (DRAPES) ×2 IMPLANT
DRAPE STERI IOBAN 125X83 (DRAPES) ×2 IMPLANT
DRAPE U-SHAPE 47X51 STRL (DRAPES) ×4 IMPLANT
DRESSING AQUACEL AG SP 3.5X10 (GAUZE/BANDAGES/DRESSINGS) ×2 IMPLANT
DRSG AQUACEL AG SP 3.5X10 (GAUZE/BANDAGES/DRESSINGS) ×1
DURAPREP 26ML APPLICATOR (WOUND CARE) ×2 IMPLANT
ELECT REM PT RETURN 15FT ADLT (MISCELLANEOUS) ×2 IMPLANT
GLOVE BIO SURGEON STRL SZ 6 (GLOVE) ×2 IMPLANT
GLOVE BIOGEL PI IND STRL 6.5 (GLOVE) ×2 IMPLANT
GLOVE BIOGEL PI IND STRL 7.5 (GLOVE) ×2 IMPLANT
GLOVE ORTHO TXT STRL SZ7.5 (GLOVE) ×4 IMPLANT
GOWN STRL REUS W/ TWL LRG LVL3 (GOWN DISPOSABLE) ×4 IMPLANT
GOWN STRL REUS W/TWL LRG LVL3 (GOWN DISPOSABLE) ×2
HEAD FEM STD 32X+1 STRL (Hips) IMPLANT
HOLDER FOLEY CATH W/STRAP (MISCELLANEOUS) ×2 IMPLANT
KIT TURNOVER KIT A (KITS) IMPLANT
LINER ACET PNNCL PLUS4 NEUTRAL (Hips) IMPLANT
NDL SAFETY ECLIPSE 18X1.5 (NEEDLE) IMPLANT
PACK ANTERIOR HIP CUSTOM (KITS) ×2 IMPLANT
PINNACLE PLUS 4 NEUTRAL (Hips) ×1 IMPLANT
PINNACLE SECTOR CUP 50MM (Hips) ×1 IMPLANT
SCREW 6.5MMX35MM (Screw) IMPLANT
STEM FEM ACTIS HIGH SZ3 (Stem) IMPLANT
SUT MNCRL AB 4-0 PS2 18 (SUTURE) ×2 IMPLANT
SUT STRATAFIX 0 PDS 27 VIOLET (SUTURE) ×1
SUT VIC AB 1 CT1 36 (SUTURE) ×6 IMPLANT
SUT VIC AB 2-0 CT1 27 (SUTURE) ×2
SUT VIC AB 2-0 CT1 TAPERPNT 27 (SUTURE) ×4 IMPLANT
SUTURE STRATFX 0 PDS 27 VIOLET (SUTURE) ×2 IMPLANT
SYR 3ML LL SCALE MARK (SYRINGE) IMPLANT
TRAY FOLEY MTR SLVR 14FR STAT (SET/KITS/TRAYS/PACK) IMPLANT
TRAY FOLEY MTR SLVR 16FR STAT (SET/KITS/TRAYS/PACK) IMPLANT
TUBE SUCTION HIGH CAP CLEAR NV (SUCTIONS) ×2 IMPLANT
WATER STERILE IRR 1000ML POUR (IV SOLUTION) ×2 IMPLANT

## 2023-07-09 NOTE — Op Note (Signed)
NAME:  Gwendolyn Morrow.: [id number]      MEDICAL RECORD NO.: [id number]      FACILITY:  [facility]      PHYSICIAN:  Shelda Pal  DATE OF BIRTH:  1941-02-12     DATE OF PROCEDURE:  07/09/2023                                 OPERATIVE REPORT         PREOPERATIVE DIAGNOSIS: Left  hip osteoarthritis.      POSTOPERATIVE DIAGNOSIS:  Left hip osteoarthritis.      PROCEDURE:  Left total hip replacement through an anterior approach   utilizing DePuy THR system, component size 50 mm pinnacle cup, a size 32+4 neutral   Altrex liner, a size 3 Hi Actis stem with a 32+1 Articuleze metal head ball.      SURGEON:  Madlyn Frankel. Charlann Boxer, M.D.      ASSISTANT:  Rosalene Billings, PA-C     ANESTHESIA:  Spinal.      SPECIMENS:  None.      COMPLICATIONS:  None.      BLOOD LOSS:  250 cc     DRAINS:  None.      INDICATION OF THE PROCEDURE:  Gwendolyn Morrow is a 82 y.o. female who had   presented to office for evaluation of left hip pain.  Radiographs revealed   progressive degenerative changes with bone-on-bone   articulation of the  hip joint, including subchondral cystic changes and osteophytes.  The patient had painful limited range of   motion significantly affecting their overall quality of life and function.  The patient was failing to    respond to conservative measures including medications and/or injections and activity modification and at this point was ready   to proceed with more definitive measures.  Consent was obtained for   benefit of pain relief.  Specific risks of infection, DVT, component   failure, dislocation, neurovascular injury, and need for revision surgery were reviewed in the office.     PROCEDURE IN DETAIL:  The patient was brought to operative theater.   Once adequate anesthesia, preoperative antibiotics, 2 gm of Ancef, 1 gm of Tranexamic Acid, and 10 mg of Decadron were administered, the patient was positioned supine on the [employer]  table.  Once the patient was safely positioned with adequate padding of boney prominences we predraped out the hip, and used fluoroscopy to confirm orientation of the pelvis.      The left hip was then prepped and draped from proximal iliac crest to   mid thigh with a shower curtain technique.      Time-out was performed identifying the patient, planned procedure, and the appropriate extremity.     An incision was then made 2 cm lateral to the   anterior superior iliac spine extending over the orientation of the   tensor fascia lata muscle and sharp dissection was carried down to the   fascia of the muscle.      The fascia was then incised.  The muscle belly was identified and swept   laterally and retractor placed along the superior neck.  Following   cauterization of the circumflex vessels and removing some pericapsular   fat, a second cobra retractor was placed on the inferior neck.  A T-capsulotomy was made along the line of the   superior neck to the trochanteric fossa, then extended proximally and   distally.  Tag sutures were placed and the retractors were then placed   intracapsular.  We then identified the trochanteric fossa and   orientation of my neck cut and then made a neck osteotomy with the femur on traction.  The femoral   head was removed without difficulty or complication.  Traction was let   off and retractors were placed posterior and anterior around the   acetabulum.      The labrum and foveal tissue were debrided.  I began reaming with a 46 mm   reamer and reamed up to 49 mm reamer with good bony bed preparation and a 50 mm  cup was chosen.  The final 50 mm Pinnacle cup was then impacted under fluoroscopy to confirm the depth of penetration and orientation with respect to   Abduction and forward flexion.  A screw was placed into the ilium followed by the hole eliminator.  The final   32+4 neutral Altrex liner was impacted with good visualized rim fit.  The cup was  positioned anatomically within the acetabular portion of the pelvis.      At this point, the femur was rolled to 100 degrees.  Further capsule was   released off the inferior aspect of the femoral neck.  I then   released the superior capsule proximally.  With the leg in a neutral position the hook was placed laterally   along the femur under the vastus lateralis origin and elevated manually and then held in position using the hook attachment on the bed.  The leg was then extended and adducted with the leg rolled to 100   degrees of external rotation.  Retractors were placed along the medial calcar and posteriorly over the greater trochanter.  Once the proximal femur was fully   exposed, I used a box osteotome to set orientation.  I then began   broaching with the starting chili pepper broach and passed this by hand and then broached up to 3.  With the 3 broach in place I chose a high offset neck and did several trial reductions.  The offset was appropriate, leg lengths   appeared to be equal best matched with the +1 head ball trial confirmed radiographically.   Given these findings, I went ahead and dislocated the hip, repositioned all   retractors and positioned the right hip in the extended and abducted position.  The final 3 Hi Actis stem was   chosen and it was impacted down to the level of neck cut.  Based on this   and the trial reductions, a final 32+1 Articuleze metal head ball was chosen and   impacted onto a clean and dry trunnion, and the hip was reduced.  The   hip had been irrigated throughout the case again at this point.  I did   reapproximate the superior capsular leaflet to the anterior leaflet   using #1 Vicryl.  The fascia of the   tensor fascia lata muscle was then reapproximated using #1 Vicryl and #0 Stratafix sutures.  The   remaining wound was closed with 2-0 Vicryl and running 4-0 Monocryl.   The hip was cleaned, dried, and dressed sterilely using Dermabond and    Aquacel dressing.  The patient was then brought   to recovery room in stable condition tolerating the procedure well.    Rosalene Billings,  PA-C was present for the entirety of the case involved from   preoperative positioning, perioperative retractor management, general   facilitation of the case, as well as primary wound closure as assistant.            Madlyn Frankel Charlann Boxer, M.D.        07/09/2023 9:55 AM

## 2023-07-09 NOTE — Evaluation (Signed)
Physical Therapy Evaluation Patient Details Name: Gwendolyn Morrow MRN: [id number] DOB: [birth year] Today's Date: 07/09/2023  History of Present Illness  74 ti female s/p L DA THA on 07/09/23. PMH: CVA, R TKA, HTN, HLD  Clinical Impression  Pt is s/p THA resulting in the deficits listed below (see PT Problem List).  Pt mobilizing well, amb 20' with RW and min assist/CGA with distance ltd by nausea. RN aware. Anticipate steady progress in acute setting  Pt will benefit from acute skilled PT to increase their independence and safety with mobility to facilitate discharge.          If plan is discharge home, recommend the following: A little help with bathing/dressing/bathroom;Help with stairs or ramp for entrance;Assist for transportation;Assistance with cooking/housework   Can travel by private vehicle        Equipment Recommendations None recommended by PT  Recommendations for Other Services       Functional Status Assessment Patient has had a recent decline in their functional status and demonstrates the ability to make significant improvements in function in a reasonable and predictable amount of time.     Precautions / Restrictions Precautions Precautions: Fall Restrictions Weight Bearing Restrictions: No Other Position/Activity Restrictions: WBAT      Mobility  Bed Mobility Overal bed mobility: Needs Assistance Bed Mobility: Supine to Sit     Supine to sit: Min assist     General bed mobility comments: assist with LLE    Transfers Overall transfer level: Needs assistance Equipment used: Rolling walker (2 wheels) Transfers: Sit to/from Stand Sit to Stand: Min assist           General transfer comment: verbal cues for hand placement and LLE position    Ambulation/Gait Ambulation/Gait assistance: Contact guard assist Gait Distance (Feet): 20 Feet Assistive device: Rolling walker (2 wheels) Gait Pattern/deviations: Step-to pattern       General Gait  Details: cues for sequence and RW position, distance limtied by nausea  Stairs            Wheelchair Mobility     Tilt Bed    Modified Rankin (Stroke Patients Only)       Balance                                             Pertinent Vitals/Pain Pain Assessment Pain Assessment: 0-10 Pain Score: 1  Pain Location: left hip Pain Descriptors / Indicators: Aching Pain Intervention(s): Limited activity within patient's tolerance, Monitored during session, Premedicated before session, Repositioned    Home Living Family/patient expects to be discharged to:: Private residence Living Arrangements: Alone Available Help at Discharge: Family;Personal care attendant (son and has "a lady"  helping as long as needed) Type of Home: House Home Access: Stairs to enter Entrance Stairs-Rails: Right Entrance Stairs-Number of Steps: 4   Home Layout: Able to live on main level with bedroom/bathroom;Two level Home Equipment: Agricultural consultant (2 wheels)      Prior Function Prior Level of Function : Independent/Modified Independent             Mobility Comments: amb with cane for 2 days prior to surgery       Extremity/Trunk Assessment   Upper Extremity Assessment Upper Extremity Assessment: Overall WFL for tasks assessed    Lower Extremity Assessment Lower Extremity Assessment: LLE deficits/detail LLE Deficits / Details: ankle WFL, knee and  hip grossly 2+ to 3/5 limited by anticipated  post op pain and weakness       Communication      Cognition Arousal: Alert Behavior During Therapy: [facility] for tasks assessed/performed Overall Cognitive Status: Within Functional Limits for tasks assessed                                          General Comments      Exercises Total Joint Exercises Ankle Circles/Pumps: AROM, Both, 10 reps   Assessment/Plan    PT Assessment Patient needs continued PT services  PT Problem List Decreased  strength;Decreased range of motion;Decreased mobility;Decreased knowledge of use of DME;Decreased activity tolerance       PT Treatment Interventions DME instruction;Gait training;Functional mobility training;Therapeutic activities;Therapeutic exercise;Patient/family education;Stair training    PT Goals (Current goals can be found in the Care Plan section)  Acute Rehab PT Goals PT Goal Formulation: With patient Time For Goal Achievement: 07/16/23 Potential to Achieve Goals: Good    Frequency 7X/week     Co-evaluation               AM-PAC PT "6 Clicks" Mobility  Outcome Measure Help needed turning from your back to your side while in a flat bed without using bedrails?: A Little Help needed moving from lying on your back to sitting on the side of a flat bed without using bedrails?: A Little Help needed moving to and from a bed to a chair (including a wheelchair)?: A Little Help needed standing up from a chair using your arms (e.g., wheelchair or bedside chair)?: A Little Help needed to walk in hospital room?: A Little Help needed climbing 3-5 steps with a railing? : A Lot 6 Click Score: 17    End of Session Equipment Utilized During Treatment: Gait belt Activity Tolerance: Patient tolerated treatment well Patient left: with call bell/phone within reach;with chair alarm set;in chair   PT Visit Diagnosis: Other abnormalities of gait and mobility (R26.89);Difficulty in walking, not elsewhere classified (R26.2)    Time: 1432-1500 PT Time Calculation (min) (ACUTE ONLY): 28 min   Charges:   PT Evaluation $PT Eval Low Complexity: 1 Low PT Treatments $Gait Training: 8-22 mins PT General Charges $$ ACUTE PT VISIT: 1 Visit         Aluel Schwarz, PT  Acute Rehab Dept [facility]) [phone]  07/09/2023   [facility] 07/09/2023, 5:07 PM

## 2023-07-09 NOTE — Transfer of Care (Signed)
Immediate Anesthesia Transfer of Care Note  Patient: Gwendolyn Morrow  Procedure(s) Performed: TOTAL HIP ARTHROPLASTY ANTERIOR APPROACH (Left: Hip)  Patient Location: PACU  Anesthesia Type:Spinal  Level of Consciousness: awake, alert , and oriented  Airway & Oxygen Therapy: Patient Spontanous Breathing and Patient connected to face mask oxygen  Post-op Assessment: Report given to RN and Post -op Vital signs reviewed and stable  Post vital signs: Reviewed and stable  Last Vitals:  Vitals Value Taken Time  BP 99/51 07/09/23 1324  Temp    Pulse 69 07/09/23 1326  Resp 22 07/09/23 1326  SpO2 99 % 07/09/23 1326  Vitals shown include unfiled device data.  Last Pain:  Vitals:   07/09/23 0912  TempSrc: Oral         Complications: No notable events documented.

## 2023-07-09 NOTE — Anesthesia Procedure Notes (Signed)
Date/Time: 07/09/2023 11:58 AM  Performed by: Florene Route, CRNAOxygen Delivery Method: Simple face mask

## 2023-07-09 NOTE — H&P (Signed)
TOTAL HIP ADMISSION H&P  Patient is admitted for left total hip arthroplasty.  Therapy Plans: HEP Disposition: Home with son and friends Planned DVT Prophylaxis: aspirin 81mg  BID DME needed: none PCP: Dr. Mallie Snooks - clearance received TXA: IV Allergies: excedrin - hives, itching , PCN - childhood, tramadol - confusion Anesthesia Concerns: significant nausea/vomiting with TKA and general anesthesia BMI: 22.9 Last HgbA1c: Not diabetic   Other: - oxycodone vs hydromorphone (nausea), robaxin, tylenol, meloxicam - Send meds ahead - Spending the night  Subjective:  Chief Complaint: left hip pain  HPI: Gwendolyn Morrow, 82 y.o. female, has a history of pain and functional disability in the left hip(s) due to arthritis and patient has failed non-surgical conservative treatments for greater than 12 weeks to include NSAID's and/or analgesics, corticosteriod injections, and activity modification.  Onset of symptoms was gradual starting 2 years ago with gradually worsening course since that time.The patient noted no past surgery on the left hip(s).  Patient currently rates pain in the left hip at 8 out of 10 with activity. Patient has worsening of pain with activity and weight bearing. Patient has evidence of joint space narrowing by imaging studies. This condition presents safety issues increasing the risk of falls.There is no current active infection.  Patient Active Problem List   Diagnosis Date Noted   Acute CVA (cerebrovascular accident) (HCC) 10/05/2020   Acute ischemic stroke (HCC) 10/04/2020   Hypertension    S/P right TKA 09/24/2016   S/P knee replacement 09/24/2016   Past Medical History:  Diagnosis Date   Arthritis    Cancer (HCC)    squamus cell: back,Right leg and hand   Complication of anesthesia    GERD (gastroesophageal reflux disease)    H/O seasonal allergies    Headache    History of loop recorder    Hypertension    PONV (postoperative nausea and vomiting)    Stroke  [facility])     Past Surgical History:  Procedure Laterality Date   ABDOMINAL HYSTERECTOMY     ABLATION     back   APPENDECTOMY     removed during C-section   BACK SURGERY     BREAST SURGERY     right breast biopsy- benign   CARPOMETACARPAL ([facility]) FUSION OF THUMB Bilateral    CESAREAN SECTION     HAND SURGERY     bilateral thumb joints replaced   LOOP RECORDER INSERTION N/A 10/06/2020   Procedure: LOOP RECORDER INSERTION;  Surgeon: Hillis Range, MD;  Location: [facility] INVASIVE CV LAB;  Service: Cardiovascular;  Laterality: N/A;   TOTAL KNEE ARTHROPLASTY Right 09/24/2016   Procedure: TOTAL KNEE ARTHROPLASTY;  Surgeon: Durene Romans, MD;  Location: [facility] ORS;  Service: Orthopedics;  Laterality: Right;   TUBAL LIGATION      No current facility-administered medications for this encounter.   Current Outpatient Medications  Medication Sig Dispense Refill Last Dose   acyclovir (ZOVIRAX) 400 MG tablet Take 400 mg by mouth 3 (three) times daily as needed (fever blisters).       amLODipine (NORVASC) 10 MG tablet Take 10 mg by mouth daily.      Ascorbic Acid (VITAMIN C) 1000 MG tablet Take 1,000 mg by mouth every evening.      BIOTIN PO Take 15 mg by mouth daily.      calcium carbonate (OSCAL) 1500 (600 Ca) MG TABS tablet Take 600 mg of elemental calcium by mouth every evening.      clopidogrel (PLAVIX) 75 MG tablet Take 75  mg by mouth daily.      meloxicam (MOBIC) 15 MG tablet Take 15 mg by mouth daily.      Multiple Vitamin (MULTIVITAMIN WITH MINERALS) TABS tablet Take 1 tablet by mouth every evening.      Omega-3 Krill Oil 500 MG CAPS Take 500 mg by mouth every evening.      pantoprazole (PROTONIX) 40 MG tablet Take 1 tablet (40 mg total) by mouth daily. 30 tablet 11    potassium chloride SA (KLOR-CON) 20 MEQ tablet Take 20 mEq by mouth daily.      pyridoxine (B-6) 100 MG tablet Take 100 mg by mouth daily.      rosuvastatin (CRESTOR) 40 MG tablet Take 1 tablet (40 mg total) by mouth daily. (Patient  taking differently: Take 40 mg by mouth every evening.) 30 tablet 5    sulfamethoxazole-trimethoprim (BACTRIM) 400-80 MG tablet Take 0.5 tablets by mouth every evening. Preventative for UTIs      triamterene-hydrochlorothiazide (MAXZIDE-25) 37.5-25 MG tablet Take 1 tablet by mouth daily.      TURMERIC PO Take 1,000 mg by mouth every evening.      vitamin B-12 (CYANOCOBALAMIN) 500 MCG tablet Take 500 mcg by mouth 3 (three) times a week.      Allergies  Allergen Reactions   Penicillins Anaphylaxis    Has patient had a PCN reaction causing immediate rash, facial/tongue/throat swelling, SOB or lightheadedness with hypotension: Yes Has patient had a PCN reaction causing severe rash involving mucus membranes or skin necrosis: No Has patient had a PCN reaction that required hospitalization Yes Has patient had a PCN reaction occurring within the last 10 years: No If all of the above answers are "NO", then may proceed with Cephalosporin use.    Enalapril Cough   Aspirin-Acetaminophen-Caffeine     Other reaction(s): Other (See Comments) EXCEDRIN   Excedrin Extra Strength [Asa-Apap-Caff Buffered] Hives   Excedrin Tension Headache [Acetaminophen-Caffeine] Hives   Oxycodone-Acetaminophen Itching   Penicillin G      Cardiac   Tyloxapol Hives and Itching    Social History   Tobacco Use   Smoking status: Never   Smokeless tobacco: Never  Substance Use Topics   Alcohol use: Not Currently    Comment: rarely    No family history on file.   Review of Systems  Constitutional:  Negative for chills and fever.  Respiratory:  Negative for cough and shortness of breath.   Cardiovascular:  Negative for chest pain.  Gastrointestinal:  Negative for nausea and vomiting.  Musculoskeletal:  Positive for arthralgias.     Objective:  Physical Exam Well nourished and well developed. General: Alert and oriented x3, cooperative and pleasant, no acute distress. Head: normocephalic, atraumatic, neck  supple. Eyes: EOMI.  Musculoskeletal: Right Hip: No lateral tenderness No pain with passive ROM  Left Hip: No tenderness to palpation Significant reproducible pain with passive ROM  Calves soft and nontender. Motor function intact in LE. Strength 5/5 LE bilaterally. Neuro: Distal pulses 2+. Sensation to light touch intact in LE.  Vital signs in last 24 hours:    Labs:   Estimated body mass index is 22.27 kg/m as calculated from the following:   Height as of 07/02/23: 5\' 6"  (1.676 m).   Weight as of 07/02/23: 62.6 kg.   Imaging Review Plain radiographs demonstrate severe degenerative joint disease of the left hip(s). The bone quality appears to be adequate for age and reported activity level.      Assessment/Plan:  End  stage arthritis, left hip(s)  The patient history, physical examination, clinical judgement of the provider and imaging studies are consistent with end stage degenerative joint disease of the left hip(s) and total hip arthroplasty is deemed medically necessary. The treatment options including medical management, injection therapy, arthroscopy and arthroplasty were discussed at length. The risks and benefits of total hip arthroplasty were presented and reviewed. The risks due to aseptic loosening, infection, stiffness, dislocation/subluxation,  thromboembolic complications and other imponderables were discussed.  The patient acknowledged the explanation, agreed to proceed with the plan and consent was signed. Patient is being admitted for inpatient treatment for surgery, pain control, PT, OT, prophylactic antibiotics, VTE prophylaxis, progressive ambulation and ADL's and discharge planning.The patient is planning to be discharged  home.   Rosalene Billings, PA-C Orthopedic Surgery [facility] [place] [phone]

## 2023-07-09 NOTE — Interval H&P Note (Signed)
History and Physical Interval Note:  07/09/2023 9:54 AM  Gwendolyn Morrow  has presented today for surgery, with the diagnosis of left hip osteoarthritis.  The various methods of treatment have been discussed with the patient and family. After consideration of risks, benefits and other options for treatment, the patient has consented to  Procedure(s): TOTAL HIP ARTHROPLASTY ANTERIOR APPROACH (Left) as a surgical intervention.  The patient's history has been reviewed, patient examined, no change in status, stable for surgery.  I have reviewed the patient's chart and labs.  Questions were answered to the patient's satisfaction.     Shelda Pal

## 2023-07-09 NOTE — Plan of Care (Signed)
CHL Tonsillectomy/Adenoidectomy, Postoperative PEDS care plan entered in error.

## 2023-07-09 NOTE — Plan of Care (Signed)
  Problem: Education: Goal: Knowledge of General Education information will improve Description: Including pain rating scale, medication(s)/side effects and non-pharmacologic comfort measures Outcome: Progressing   Problem: Activity: Goal: Risk for activity intolerance will decrease Outcome: Progressing   Problem: Education: Goal: Knowledge of the prescribed therapeutic regimen will improve Outcome: Progressing   Problem: Activity: Goal: Ability to avoid complications of mobility impairment will improve Outcome: Progressing   Problem: Pain Management: Goal: Pain level will decrease with appropriate interventions Outcome: Progressing

## 2023-07-09 NOTE — Anesthesia Postprocedure Evaluation (Signed)
Anesthesia Post Note  Patient: Gwendolyn Morrow  Procedure(s) Performed: TOTAL HIP ARTHROPLASTY ANTERIOR APPROACH (Left: Hip)     Patient location during evaluation: PACU Anesthesia Type: Spinal Level of consciousness: sedated and patient cooperative Pain management: pain level controlled Vital Signs Assessment: post-procedure vital signs reviewed and stable Respiratory status: spontaneous breathing Cardiovascular status: stable Postop Assessment: spinal receding Anesthetic complications: no   No notable events documented.  Last Vitals:  Vitals:   07/09/23 1559 07/09/23 1800  BP: 118/61 (!) 125/59  Pulse: 66 (!) 59  Resp: 18 17  Temp: 36.4 C 36.4 C  SpO2: 96% 96%    Last Pain:  Vitals:   07/09/23 1800  TempSrc: Oral  PainSc:                  Lewie Loron

## 2023-07-09 NOTE — Anesthesia Preprocedure Evaluation (Addendum)
Anesthesia Evaluation  Patient identified by MRN, date of birth, ID band Patient awake    Reviewed: Allergy & Precautions, NPO status , Patient's Chart, lab work & pertinent test results  History of Anesthesia Complications (+) PONV and history of anesthetic complications  Airway Mallampati: II  TM Distance: >3 FB Neck ROM: Full    Dental no notable dental hx.    Pulmonary neg pulmonary ROS   Pulmonary exam normal breath sounds clear to auscultation       Cardiovascular hypertension, Pt. on medications Normal cardiovascular exam Rhythm:Regular Rate:Normal  Echo 09/2020  1. Left ventricular ejection fraction, by estimation, is 65 to 70%. The left ventricle has normal function. The left ventricle has no regional wall motion abnormalities. Left ventricular diastolic parameters are consistent with Grade I diastolic dysfunction (impaired relaxation).   2. Right ventricular systolic function is normal. The right ventricular size is normal. There is normal pulmonary artery systolic pressure.   3. The mitral valve is normal in structure. Trivial mitral valve regurgitation. No evidence of mitral stenosis.   4. The aortic valve is tricuspid. Aortic valve regurgitation is not visualized. No aortic stenosis is present.   5. The inferior vena cava is normal in size with greater than 50% respiratory variability, suggesting right atrial pressure of 3 mmHg.     Neuro/Psych  Headaches CVA  negative psych ROS   GI/Hepatic Neg liver ROS,GERD  ,,  Endo/Other  negative endocrine ROS    Renal/GU negative Renal ROS     Musculoskeletal  (+) Arthritis ,    Abdominal   Peds  Hematology negative hematology ROS (+)   Anesthesia Other Findings Day of surgery medications reviewed with the patient.  Reproductive/Obstetrics                             Anesthesia Physical Anesthesia Plan  ASA: 3  Anesthesia Plan: Spinal    Post-op Pain Management: Ofirmev IV (intra-op)*   Induction:   PONV Risk Score and Plan: 3 and Ondansetron, Treatment may vary due to age or medical condition and Dexamethasone  Airway Management Planned: Natural Airway  Additional Equipment:   Intra-op Plan:   Post-operative Plan:   Informed Consent: I have reviewed the patients History and Physical, chart, labs and discussed the procedure including the risks, benefits and alternatives for the proposed anesthesia with the patient or authorized representative who has indicated his/her understanding and acceptance.     Dental advisory given  Plan Discussed with: CRNA  Anesthesia Plan Comments: (Last dose of plavix was 07/01/2023. Discussed in detail risks of spinal anesthesia. )        Anesthesia Quick Evaluation

## 2023-07-09 NOTE — Care Plan (Signed)
Ortho Bundle Case Management Note  Patient Details  Name: DEZERAY MARCIEL MRN: [id number] Date of Birth: [birth year]                  L THA on 07/09/23.  DCP: Home with son and friends.  DME: Has RW and cane.  PT: HEP   DME Arranged:  N/A DME Agency:       Additional Comments: Please contact me with any questions of if this plan should need to change.    Despina Pole, CCM Case Manager, [facility]  [phone] 07/09/2023, 12:33 PM

## 2023-07-09 NOTE — Anesthesia Procedure Notes (Signed)
Spinal  Patient location during procedure: OR Start time: 07/09/2023 12:00 PM End time: 07/09/2023 12:11 PM Reason for block: surgical anesthesia Staffing Performed: resident/CRNA  Resident/CRNA: Florene Route, CRNA Performed by: Florene Route, CRNA Authorized by: Lewie Loron, MD   Preanesthetic Checklist Completed: patient identified, IV checked, site marked, risks and benefits discussed, surgical consent, monitors and equipment checked, pre-op evaluation and timeout performed Spinal Block Patient position: sitting Prep: DuraPrep Patient monitoring: heart rate, continuous pulse ox and blood pressure Approach: midline Location: L4-5 Injection technique: single-shot Needle Needle type: Pencan  Needle gauge: 24 G Needle length: 10 cm Assessment Sensory level: T4 Events: CSF return Additional Notes Kit expiration date 02/14/2025 and lot # [id number] Clear free flow CSF, negative heme, negative paresthesia Tolerated well and returned to supine position

## 2023-07-09 NOTE — Discharge Instructions (Signed)

## 2023-07-10 ENCOUNTER — Encounter (HOSPITAL_COMMUNITY): Payer: Self-pay | Admitting: Orthopedic Surgery

## 2023-07-10 DIAGNOSIS — Z85828 Personal history of other malignant neoplasm of skin: Secondary | ICD-10-CM | POA: Diagnosis not present

## 2023-07-10 DIAGNOSIS — Z96651 Presence of right artificial knee joint: Secondary | ICD-10-CM | POA: Diagnosis not present

## 2023-07-10 DIAGNOSIS — I10 Essential (primary) hypertension: Secondary | ICD-10-CM | POA: Diagnosis not present

## 2023-07-10 DIAGNOSIS — Z79899 Other long term (current) drug therapy: Secondary | ICD-10-CM | POA: Diagnosis not present

## 2023-07-10 DIAGNOSIS — M1612 Unilateral primary osteoarthritis, left hip: Secondary | ICD-10-CM | POA: Diagnosis not present

## 2023-07-10 DIAGNOSIS — Z8673 Personal history of transient ischemic attack (TIA), and cerebral infarction without residual deficits: Secondary | ICD-10-CM | POA: Diagnosis not present

## 2023-07-10 LAB — CBC
HCT: 32.9 % — ABNORMAL LOW (ref 36.0–46.0)
Hemoglobin: 11.1 g/dL — ABNORMAL LOW (ref 12.0–15.0)
MCH: 31.3 pg (ref 26.0–34.0)
MCHC: 33.7 g/dL (ref 30.0–36.0)
MCV: 92.7 fL (ref 80.0–100.0)
Platelets: 172 10*3/uL (ref 150–400)
RBC: 3.55 MIL/uL — ABNORMAL LOW (ref 3.87–5.11)
RDW: 11.9 % (ref 11.5–15.5)
WBC: 13.6 10*3/uL — ABNORMAL HIGH (ref 4.0–10.5)
nRBC: 0 % (ref 0.0–0.2)

## 2023-07-10 LAB — BASIC METABOLIC PANEL
Anion gap: 10 (ref 5–15)
BUN: 19 mg/dL (ref 8–23)
CO2: 24 mmol/L (ref 22–32)
Calcium: 8.9 mg/dL (ref 8.9–10.3)
Chloride: 101 mmol/L (ref 98–111)
Creatinine, Ser: 0.82 mg/dL (ref 0.44–1.00)
GFR, Estimated: >60 mL/min (ref >60)
Glucose, Bld: 205 mg/dL — ABNORMAL HIGH (ref 70–99)
Potassium: 2.9 mmol/L — ABNORMAL LOW (ref 3.5–5.1)
Sodium: 135 mmol/L (ref 135–145)

## 2023-07-10 MED ORDER — POTASSIUM CHLORIDE CRYS ER 20 MEQ PO TBCR
40.0000 meq | EXTENDED_RELEASE_TABLET | Freq: Once | ORAL | Status: AC
  Administered 2023-07-10: 40 meq via ORAL
  Filled 2023-07-10: qty 2

## 2023-07-10 NOTE — Progress Notes (Signed)
Physical Therapy Treatment Patient Details Name: Gwendolyn Morrow MRN: [id number] DOB: 1941-05-06 Today's Date: 07/10/2023   History of Present Illness 82 yo  female s/p L DA THA on 07/09/23. PMH: CVA, R TKA, HTN, HLD    PT Comments  Pt is progressing this session, incr amb distance although still reporting significant fatigue with activity.   K+ was low this am, that was replenished. Pt expressed some reluctance regarding d/c today. Pt may benefit from another day to work with PT to incr independence, activity tolerance and overall safety     If plan is discharge home, recommend the following: A little help with bathing/dressing/bathroom;Help with stairs or ramp for entrance;Assist for transportation;Assistance with cooking/housework   Can travel by private vehicle        Equipment Recommendations  None recommended by PT    Recommendations for Other Services       Precautions / Restrictions Precautions Precautions: Fall Restrictions Weight Bearing Restrictions: No Other Position/Activity Restrictions: WBAT     Mobility  Bed Mobility Overal bed mobility: Needs Assistance Bed Mobility: Supine to Sit     Supine to sit: Contact guard, Min assist     General bed mobility comments: light assist to progress RLE off bed    Transfers Overall transfer level: Needs assistance Equipment used: Rolling walker (2 wheels) Transfers: Sit to/from Stand Sit to Stand: Contact guard assist, Min assist           General transfer comment: verbal cues for hand placement and LLE position    Ambulation/Gait Ambulation/Gait assistance: Contact guard assist Gait Distance (Feet): 45 Feet Assistive device: Rolling walker (2 wheels) Gait Pattern/deviations: Step-to pattern       General Gait Details: cues for sequence and RW position, distance limtied by fatigue   Stairs             Wheelchair Mobility     Tilt Bed    Modified Rankin (Stroke Patients Only)        Balance                                            Cognition Arousal: Alert Behavior During Therapy: WFL for tasks assessed/performed Overall Cognitive Status: Within Functional Limits for tasks assessed                                          Exercises      General Comments        Pertinent Vitals/Pain Pain Assessment Pain Assessment: 0-10 Pain Score: 2  Pain Location: left hip Pain Descriptors / Indicators: Sore Pain Intervention(s): Limited activity within patient's tolerance, Monitored during session, Premedicated before session, Repositioned    Home Living                          Prior Function            PT Goals (current goals can now be found in the care plan section) Acute Rehab PT Goals PT Goal Formulation: With patient Time For Goal Achievement: 07/16/23 Potential to Achieve Goals: Good Progress towards PT goals: Progressing toward goals    Frequency    7X/week      PT Plan      Co-evaluation  AM-PAC PT "6 Clicks" Mobility   Outcome Measure  Help needed turning from your back to your side while in a flat bed without using bedrails?: A Little Help needed moving from lying on your back to sitting on the side of a flat bed without using bedrails?: A Little Help needed moving to and from a bed to a chair (including a wheelchair)?: A Little Help needed standing up from a chair using your arms (e.g., wheelchair or bedside chair)?: A Little Help needed to walk in hospital room?: A Little Help needed climbing 3-5 steps with a railing? : A Lot 6 Click Score: 17    End of Session Equipment Utilized During Treatment: Gait belt Activity Tolerance: Patient tolerated treatment well Patient left: with call bell/phone within reach;with chair alarm set;in chair   PT Visit Diagnosis: Other abnormalities of gait and mobility (R26.89);Difficulty in walking, not elsewhere classified (R26.2)      Time: 1610-9604 PT Time Calculation (min) (ACUTE ONLY): 16 min  Charges:    $Gait Training: 8-22 mins PT General Charges $$ ACUTE PT VISIT: 1 Visit                     Ranessa Kosta, PT  Acute Rehab Dept [facility]) [phone]  07/10/2023    [facility] 07/10/2023, 12:36 PM

## 2023-07-10 NOTE — Care Management Obs Status (Signed)
MEDICARE OBSERVATION STATUS NOTIFICATION   Patient Details  Name: Gwendolyn Morrow MRN: [id number] Date of Birth: Aug 29, 1941   Medicare Observation Status Notification Given:  Hart Robinsons, LCSW 07/10/2023, 2:46 PM

## 2023-07-10 NOTE — Plan of Care (Signed)
  Problem: Clinical Measurements: Goal: Ability to maintain clinical measurements within normal limits will improve Outcome: Progressing Goal: Diagnostic test results will improve Outcome: Progressing   Problem: Coping: Goal: Level of anxiety will decrease Outcome: Progressing   Problem: Pain Management: Goal: General experience of comfort will improve Outcome: Progressing   Problem: Pain Management: Goal: Pain level will decrease with appropriate interventions Outcome: Progressing

## 2023-07-10 NOTE — TOC Transition Note (Signed)
Transition of Care [facility]) - CM/SW Discharge Note   Patient Details  Name: Gwendolyn Morrow MRN: [id number] Date of Birth: [birth year]  Transition of Care [facility]) CM/SW Contact:  Amada Jupiter, LCSW Phone Number: 07/10/2023, 10:13 AM   Clinical Narrative:     Met with pt who confirms she has all needed DME in the home.  Plan for HEP.  No further TOC needs.  Final next level of care: Home/Self Care Barriers to Discharge: No Barriers Identified   Patient Goals and CMS Choice      Discharge Placement                         Discharge Plan and Services Additional resources added to the After Visit Summary for                  DME Arranged: N/A                    Social Determinants of Health (SDOH) Interventions SDOH Screenings   Food Insecurity: No Food Insecurity (07/09/2023)  Housing: Low Risk  (07/09/2023)  Transportation Needs: No Transportation Needs (07/09/2023)  Utilities: Not At Risk (07/09/2023)  Tobacco Use: Low Risk  (07/09/2023)     Readmission Risk Interventions     No data to display

## 2023-07-10 NOTE — Progress Notes (Signed)
PT  TX NOTE  07/10/23 1500  PT Visit Information  Last PT Received On 07/10/23  Assistance Needed Pt progressing steadily. Amb  incr distance, activity tol improving. Reviewed THA HEP and progression of exercises at home. Anticipate pt will be ready for d/c  07/11/23 from PT standpoint. Continue PT POC  History of Present Illness 82 yo  female s/p L DA THA on 07/09/23. PMH: CVA, R TKA, HTN, HLD  Precautions  Precautions Fall  Restrictions  Weight Bearing Restrictions No  Other Position/Activity Restrictions WBAT  Pain Assessment  Pain Assessment 0-10  Pain Score 3  Pain Location left hip  Pain Descriptors / Indicators Sore  Pain Intervention(s) Limited activity within patient's tolerance;Monitored during session;Premedicated before session;Repositioned  Cognition  Arousal Alert  Behavior During Therapy WFL for tasks assessed/performed  Overall Cognitive Status Within Functional Limits for tasks assessed  Bed Mobility  Overal bed mobility Needs Assistance  Bed Mobility Supine to Sit;Sit to Supine  Supine to sit Contact guard;Supervision  Sit to supine Supervision  General bed mobility comments pt able to self assist and progress RLE on and  off bed  Transfers  Overall transfer level Needs assistance  Equipment used Rolling walker (2 wheels)  Transfers Sit to/from Stand  Sit to Stand Contact guard assist;Supervision  General transfer comment ongoing education for proper hand placement and LLE position  Ambulation/Gait  Ambulation/Gait assistance Contact guard assist  Gait Distance (Feet) 80 Feet (12' x2)  Assistive device Rolling walker (2 wheels)  Gait Pattern/deviations Step-to pattern  General Gait Details cues for sequence and RW position, 2 brief standing rests  Total Joint Exercises  Ankle Circles/Pumps AROM;Both;10 reps  Quad Sets AROM;Strengthening;Both;10 reps  Heel Slides AAROM;AROM;Left;10 reps  Hip ABduction/ADduction AROM;AAROM;Left;5 reps;Limitations  Hip  Abduction/Adduction Limitations inner thigh pain, cues to decr ROM  PT - End of Session  Equipment Utilized During Treatment Gait belt  Activity Tolerance Patient tolerated treatment well  Patient left with call bell/phone within reach;with chair alarm set;in chair   PT - Assessment/Plan  PT Visit Diagnosis Other abnormalities of gait and mobility (R26.89);Difficulty in walking, not elsewhere classified (R26.2)  PT Frequency (ACUTE ONLY) 7X/week  Follow Up Recommendations Follow physician's recommendations for discharge plan and follow up therapies  Patient can return home with the following A little help with bathing/dressing/bathroom;Help with stairs or ramp for entrance;Assist for transportation;Assistance with cooking/housework  PT equipment None recommended by PT  AM-PAC PT "6 Clicks" Mobility Outcome Measure (Version 2)  Help needed turning from your back to your side while in a flat bed without using bedrails? 3  Help needed moving from lying on your back to sitting on the side of a flat bed without using bedrails? 3  Help needed moving to and from a bed to a chair (including a wheelchair)? 3  Help needed standing up from a chair using your arms (e.g., wheelchair or bedside chair)? 3  Help needed to walk in hospital room? 3  Help needed climbing 3-5 steps with a railing?  2  6 Click Score 17  Consider Recommendation of Discharge To: Home with [facility]  PT Goal Progression  Progress towards PT goals Progressing toward goals  Acute Rehab PT Goals  PT Goal Formulation With patient  Time For Goal Achievement 07/16/23  Potential to Achieve Goals Good  PT Time Calculation  PT Start Time (ACUTE ONLY) 1456  PT Stop Time (ACUTE ONLY) 1518  PT Time Calculation (min) (ACUTE ONLY) 22 min  PT General  Charges  $$ ACUTE PT VISIT 1 Visit  PT Treatments  $Gait Training 8-22 mins   Delice Bison, PT  Acute Rehab Dept [facility]) [phone]  07/10/2023

## 2023-07-10 NOTE — Progress Notes (Addendum)
   Subjective: 1 Day Post-Op Procedure(s) (LRB): TOTAL HIP ARTHROPLASTY ANTERIOR APPROACH (Left) Patient reports pain as mild.   Patient seen in rounds with Dr. Charlann Boxer. Patient is resting in bed on exam this morning. No acute events overnight. Foley catheter removed. Patient ambulated 20 feet with PT yesterday. We will start therapy today.   Objective: Vital signs in last 24 hours: Temp:  [97.5 F (36.4 C)-98.5 F (36.9 C)] 97.8 F (36.6 C) (10/23 0609) Pulse Rate:  [59-95] 72 (10/23 0609) Resp:  [12-18] 17 (10/23 0609) BP: (99-152)/(47-73) 124/60 (10/23 0609) SpO2:  [88 %-100 %] 93 % (10/23 0609) Weight:  [62.6 kg] 62.6 kg (10/22 0857)  Intake/Output from previous day:  Intake/Output Summary (Last 24 hours) at 07/10/2023 0756 Last data filed at 07/10/2023 0600 Gross per 24 hour  Intake 1470 ml  Output 1550 ml  Net -80 ml     Intake/Output this shift: No intake/output data recorded.  Labs: Recent Labs    07/10/23 0330  HGB 11.1*   Recent Labs    07/10/23 0330  WBC 13.6*  RBC 3.55*  HCT 32.9*  PLT 172   Recent Labs    07/10/23 0330  NA 135  K 2.9*  CL 101  CO2 24  BUN 19  CREATININE 0.82  GLUCOSE 205*  CALCIUM 8.9   No results for input(s): "LABPT", "INR" in the last 72 hours.  Exam: General - Patient is Alert and Oriented Extremity - Neurologically intact Sensation intact distally Intact pulses distally Dorsiflexion/Plantar flexion intact Dressing - dressing C/D/I Motor Function - intact, moving foot and toes well on exam.   Past Medical History:  Diagnosis Date   Arthritis    Cancer (HCC)    squamus cell: back,Right leg and hand   Complication of anesthesia    GERD (gastroesophageal reflux disease)    H/O seasonal allergies    Headache    History of loop recorder    Hypertension    PONV (postoperative nausea and vomiting)    Stroke (HCC)     Assessment/Plan: 1 Day Post-Op Procedure(s) (LRB): TOTAL HIP ARTHROPLASTY ANTERIOR  APPROACH (Left) Principal Problem:   S/P total left hip arthroplasty  Estimated body mass index is 22.27 kg/m as calculated from the following:   Height as of this encounter: 5\' 6"  (1.676 m).   Weight as of this encounter: 62.6 kg. Advance diet Up with therapy D/C IV fluids  DVT Prophylaxis - Plavix Weight bearing as tolerated.  Hgb stable at 11.1 this AM K 2.9 - routinely on 20 meq potassium daily, will substitute for 40 meq today  Has all post op meds at home.   Plan is to go Home after hospital stay. Plan for discharge today after meeting goals with therapy. Follow up in the office in 2 weeks.   Rosalene Billings, PA-C Orthopedic Surgery [phone] 07/10/2023, 7:56 AM

## 2023-07-11 DIAGNOSIS — Z79899 Other long term (current) drug therapy: Secondary | ICD-10-CM | POA: Diagnosis not present

## 2023-07-11 DIAGNOSIS — Z8673 Personal history of transient ischemic attack (TIA), and cerebral infarction without residual deficits: Secondary | ICD-10-CM | POA: Diagnosis not present

## 2023-07-11 DIAGNOSIS — M1612 Unilateral primary osteoarthritis, left hip: Secondary | ICD-10-CM | POA: Diagnosis not present

## 2023-07-11 DIAGNOSIS — Z85828 Personal history of other malignant neoplasm of skin: Secondary | ICD-10-CM | POA: Diagnosis not present

## 2023-07-11 DIAGNOSIS — Z96651 Presence of right artificial knee joint: Secondary | ICD-10-CM | POA: Diagnosis not present

## 2023-07-11 DIAGNOSIS — I10 Essential (primary) hypertension: Secondary | ICD-10-CM | POA: Diagnosis not present

## 2023-07-11 LAB — CBC
HCT: 32.5 % — ABNORMAL LOW (ref 36.0–46.0)
Hemoglobin: 11.2 g/dL — ABNORMAL LOW (ref 12.0–15.0)
MCH: 31.8 pg (ref 26.0–34.0)
MCHC: 34.5 g/dL (ref 30.0–36.0)
MCV: 92.3 fL (ref 80.0–100.0)
Platelets: 191 10*3/uL (ref 150–400)
RBC: 3.52 MIL/uL — ABNORMAL LOW (ref 3.87–5.11)
RDW: 12.3 % (ref 11.5–15.5)
WBC: 15.7 10*3/uL — ABNORMAL HIGH (ref 4.0–10.5)
nRBC: 0 % (ref 0.0–0.2)

## 2023-07-11 NOTE — Progress Notes (Signed)
Physical Therapy Treatment Patient Details Name: Gwendolyn Morrow MRN: [id number] DOB: [birth year] Today's Date: 07/11/2023   History of Present Illness 82 yo  female s/p L DA THA on 07/09/23. PMH: CVA, R TKA, HTN, HLD    PT Comments  Pt reports feeling much better today.  Pt ambulated in hallway and practiced safe stair technique.  Pt reports she has HEP handout and did not have questions on exercises.  Pt did not feel she needed to perform exercises prior to d/c and eager to d/c home today after this session. Pt had no further questions.    If plan is discharge home, recommend the following: A little help with bathing/dressing/bathroom;Help with stairs or ramp for entrance;Assist for transportation;Assistance with cooking/housework   Can travel by private vehicle        Equipment Recommendations  None recommended by PT    Recommendations for Other Services       Precautions / Restrictions Precautions Precautions: Fall Restrictions Other Position/Activity Restrictions: WBAT     Mobility  Bed Mobility Overal bed mobility: Needs Assistance Bed Mobility: Supine to Sit, Sit to Supine     Supine to sit: Supervision, HOB elevated Sit to supine: Supervision, HOB elevated   General bed mobility comments: pt able to self assist and progress RLE on and  off bed    Transfers Overall transfer level: Needs assistance Equipment used: Rolling walker (2 wheels) Transfers: Sit to/from Stand Sit to Stand: Supervision           General transfer comment: cue for hand placement    Ambulation/Gait Ambulation/Gait assistance: Contact guard assist, Supervision Gait Distance (Feet): 140 Feet Assistive device: Rolling walker (2 wheels) Gait Pattern/deviations: Step-to pattern, Decreased stance time - left, Antalgic       General Gait Details: pt able to recall sequence and keeping toes "pointing straight ahead", cue for heel strike as able, slow but steady with RW   Stairs Stairs:  Yes Stairs assistance: Contact guard assist Stair Management: Step to pattern, Forwards, One rail Left Number of Stairs: 2 General stair comments: performed once with one rail and one hand; performed again with 2 hands on left rail which pt prefers for improved stability/comfort   Wheelchair Mobility     Tilt Bed    Modified Rankin (Stroke Patients Only)       Balance                                            Cognition Arousal: Alert Behavior During Therapy: WFL for tasks assessed/performed Overall Cognitive Status: Within Functional Limits for tasks assessed                                          Exercises      General Comments        Pertinent Vitals/Pain Pain Assessment Pain Assessment: 0-10 Pain Score: 2  Pain Location: left hip Pain Descriptors / Indicators: Sore Pain Intervention(s): Monitored during session, Repositioned, RN gave pain meds during session    Home Living                          Prior Function            PT Goals (current  goals can now be found in the care plan section) Progress towards PT goals: Progressing toward goals    Frequency    7X/week      PT Plan      Co-evaluation              AM-PAC PT "6 Clicks" Mobility   Outcome Measure  Help needed turning from your back to your side while in a flat bed without using bedrails?: A Little Help needed moving from lying on your back to sitting on the side of a flat bed without using bedrails?: A Little Help needed moving to and from a bed to a chair (including a wheelchair)?: A Little Help needed standing up from a chair using your arms (e.g., wheelchair or bedside chair)?: A Little Help needed to walk in hospital room?: A Little Help needed climbing 3-5 steps with a railing? : A Little 6 Click Score: 18    End of Session Equipment Utilized During Treatment: Gait belt Activity Tolerance: Patient tolerated treatment  well Patient left: in bed;with call bell/phone within reach;with bed alarm set;with family/visitor present   PT Visit Diagnosis: Difficulty in walking, not elsewhere classified (R26.2)     Time: 1001-1014 PT Time Calculation (min) (ACUTE ONLY): 13 min  Charges:    $Gait Training: 8-22 mins PT General Charges $$ ACUTE PT VISIT: 1 Visit                    Paulino Door, DPT Physical Therapist Acute Rehabilitation Services Office: [phone]    [name] 07/11/2023, 3:42 PM

## 2023-07-11 NOTE — Progress Notes (Signed)
Reviewed d/c instructions with pt. All questions answered. Pt taken to front entrance via wheelchair to meet ride.

## 2023-07-11 NOTE — Progress Notes (Signed)
Patient ID: Gwendolyn Morrow, female   DOB: 1941-01-30, 82 y.o.   MRN: [id number] Subjective: 2 Days Post-Op Procedure(s) (LRB): TOTAL HIP ARTHROPLASTY ANTERIOR APPROACH (Left)    Patient reports pain as mild.  Feeling a lot better this am.  Objective:   VITALS:   Vitals:   07/10/23 2153 07/11/23 0436  BP: 117/64 123/63  Pulse: 68 64  Resp: 16 16  Temp: 98.3 F (36.8 C) 97.8 F (36.6 C)  SpO2: 93% 95%    Neurovascular intact Incision: dressing C/D/I  LABS Recent Labs    07/10/23 0330 07/11/23 0346  HGB 11.1* 11.2*  HCT 32.9* 32.5*  WBC 13.6* 15.7*  PLT 172 191    Recent Labs    07/10/23 0330  NA 135  K 2.9*  BUN 19  CREATININE 0.82  GLUCOSE 205*    No results for input(s): "LABPT", "INR" in the last 72 hours.   Assessment/Plan: 2 Days Post-Op Procedure(s) (LRB): TOTAL HIP ARTHROPLASTY ANTERIOR APPROACH (Left)   Up with therapy Home likely today after therapy given her improvement RTC in 2 weeks

## 2023-07-15 NOTE — Discharge Summary (Signed)
Patient ID: Gwendolyn Morrow MRN: [id number] DOB/AGE: [birth year] 82 y.o.  Admit date: 07/09/2023 Discharge date: 07/11/2023  Admission Diagnoses:  Left hip osteoarthritis  Discharge Diagnoses:  Principal Problem:   S/P total left hip arthroplasty   Past Medical History:  Diagnosis Date   Arthritis    Cancer (HCC)    squamus cell: back,Right leg and hand   Complication of anesthesia    GERD (gastroesophageal reflux disease)    H/O seasonal allergies    Headache    History of loop recorder    Hypertension    PONV (postoperative nausea and vomiting)    Stroke [facility])     Surgeries: Procedure(s): TOTAL HIP ARTHROPLASTY ANTERIOR APPROACH on 07/09/2023   Consultants:   Discharged Condition: Improved  Hospital Course: Gwendolyn Morrow is an 82 y.o. female who was admitted 07/09/2023 for operative treatment ofS/P total left hip arthroplasty. Patient has severe unremitting pain that affects sleep, daily activities, and work/hobbies. After pre-op clearance the patient was taken to the operating room on 07/09/2023 and underwent  Procedure(s): TOTAL HIP ARTHROPLASTY ANTERIOR APPROACH.    Patient was given perioperative antibiotics:  Anti-infectives (From admission, onward)    Start     Dose/Rate Route Frequency Ordered Stop   07/09/23 1800  sulfamethoxazole-trimethoprim (BACTRIM) 400-80 MG per tablet 0.5 tablet  Status:  Discontinued        0.5 tablet Oral Every evening 07/09/23 1603 07/11/23 1701   07/09/23 1800  ceFAZolin (ANCEF) IVPB 2g/100 mL premix        2 g 200 mL/hr over 30 Minutes Intravenous Every 6 hours 07/09/23 1444 07/09/23 2359   07/09/23 0900  ceFAZolin (ANCEF) IVPB 2g/100 mL premix        2 g 200 mL/hr over 30 Minutes Intravenous On call to O.R. 07/09/23 0857 07/09/23 1218        Patient was given sequential compression devices, early ambulation, and chemoprophylaxis to prevent DVT. Patient worked with PT and was meeting their goals regarding safe ambulation and  transfers.  Patient benefited maximally from hospital stay and there were no complications.    Recent vital signs: No data found.   Recent laboratory studies: No results for input(s): "WBC", "HGB", "HCT", "PLT", "NA", "K", "CL", "CO2", "BUN", "CREATININE", "GLUCOSE", "INR", "CALCIUM" in the last 72 hours.  Invalid input(s): "PT", "2"   Discharge Medications:   Allergies as of 07/11/2023       Reactions   Penicillins Anaphylaxis   Has patient had a PCN reaction causing immediate rash, facial/tongue/throat swelling, SOB or lightheadedness with hypotension: Yes Has patient had a PCN reaction causing severe rash involving mucus membranes or skin necrosis: No Has patient had a PCN reaction that required hospitalization Yes Has patient had a PCN reaction occurring within the last 10 years: No If all of the above answers are "NO", then may proceed with Cephalosporin use. Tolerated Cephalosporin Date: 07/09/23.   Enalapril Cough   Aspirin-acetaminophen-caffeine    Other reaction(s): Other (See Comments) EXCEDRIN   Excedrin Extra Strength [asa-apap-caff Buffered] Hives   Excedrin Tension Headache [acetaminophen-caffeine] Hives   Oxycodone-acetaminophen Itching   Penicillin G     Cardiac   Tyloxapol Hives, Itching        Medication List     TAKE these medications    acyclovir 400 MG tablet Commonly known as: ZOVIRAX Take 400 mg by mouth 3 (three) times daily as needed (fever blisters).   amLODipine 10 MG tablet Commonly known as: NORVASC Take 10 mg  by mouth daily.   BIOTIN PO Take 15 mg by mouth daily.   calcium carbonate 1500 (600 Ca) MG Tabs tablet Commonly known as: OSCAL Take 600 mg of elemental calcium by mouth every evening.   clopidogrel 75 MG tablet Commonly known as: PLAVIX Take 75 mg by mouth daily.   meloxicam 15 MG tablet Commonly known as: MOBIC Take 15 mg by mouth daily.   multivitamin with minerals Tabs tablet Take 1 tablet by mouth every  evening.   Omega-3 Krill Oil 500 MG Caps Take 500 mg by mouth every evening.   pantoprazole 40 MG tablet Commonly known as: Protonix Take 1 tablet (40 mg total) by mouth daily.   potassium chloride SA 20 MEQ tablet Commonly known as: KLOR-CON M Take 20 mEq by mouth daily.   pyridoxine 100 MG tablet Commonly known as: B-6 Take 100 mg by mouth daily.   rosuvastatin 40 MG tablet Commonly known as: Crestor Take 1 tablet (40 mg total) by mouth daily. What changed: when to take this   sulfamethoxazole-trimethoprim 400-80 MG tablet Commonly known as: BACTRIM Take 0.5 tablets by mouth every evening. Preventative for UTIs   triamterene-hydrochlorothiazide 37.5-25 MG tablet Commonly known as: MAXZIDE-25 Take 1 tablet by mouth daily.   TURMERIC PO Take 1,000 mg by mouth every evening.   vitamin B-12 500 MCG tablet Commonly known as: CYANOCOBALAMIN Take 500 mcg by mouth 3 (three) times a week.   vitamin C 1000 MG tablet Take 1,000 mg by mouth every evening.               Discharge Care Instructions  (From admission, onward)           Start     Ordered   07/10/23 0000  Change dressing       Comments: Maintain surgical dressing until follow up in the clinic. If the edges start to pull up, may reinforce with tape. If the dressing is no longer working, may remove and cover with gauze and tape, but must keep the area dry and clean.  Call with any questions or concerns.   07/10/23 0758            Diagnostic Studies: DG HIP UNILAT WITH PELVIS 1V LEFT  Result Date: 07/09/2023 CLINICAL DATA:  Elective surgery. EXAM: DG HIP (WITH OR WITHOUT PELVIS) 1V*L* COMPARISON:  None Available. FINDINGS: Two fluoroscopic spot views of the pelvis and left hip obtained in the operating room. Sequential images during hip arthroplasty. Fluoroscopy time 10 seconds. Dose 0.98 mGy. IMPRESSION: Intraoperative fluoroscopy for left hip arthroplasty. Electronically Signed   By: Narda Rutherford M.D.   On: 07/09/2023 15:12   DG Pelvis Portable  Result Date: 07/09/2023 CLINICAL DATA:  [id number] S/P total hip arthroplasty [id number] EXAM: PORTABLE PELVIS 1-2 VIEWS COMPARISON:  None Available. FINDINGS: Left hip arthroplasty in expected alignment. No periprosthetic lucency or fracture. Recent postsurgical change includes air and edema in the soft tissues. IMPRESSION: Left hip arthroplasty without immediate postoperative complication. Electronically Signed   By: Narda Rutherford M.D.   On: 07/09/2023 15:11   DG C-Arm 1-60 Min-No Report  Result Date: 07/09/2023 Fluoroscopy was utilized by the requesting physician.  No radiographic interpretation.   DG C-Arm 1-60 Min-No Report  Result Date: 07/09/2023 Fluoroscopy was utilized by the requesting physician.  No radiographic interpretation.   CUP PACEART REMOTE DEVICE CHECK  Result Date: 07/02/2023 ILR summary report received. Battery status OK. Normal device function. No new symptom, tachy, brady, or pause  episodes. No new AF episodes. Monthly summary reports and ROV/PRN. MC, CVRS   Disposition: Discharge disposition: 01-Home or Self Care       Discharge Instructions     Call MD / Call 911   Complete by: As directed    If you experience chest pain or shortness of breath, CALL 911 and be transported to the hospital emergency room.  If you develope a fever above 101 F, pus (white drainage) or increased drainage or redness at the wound, or calf pain, call your surgeon's office.   Change dressing   Complete by: As directed    Maintain surgical dressing until follow up in the clinic. If the edges start to pull up, may reinforce with tape. If the dressing is no longer working, may remove and cover with gauze and tape, but must keep the area dry and clean.  Call with any questions or concerns.   Constipation Prevention   Complete by: As directed    Drink plenty of fluids.  Prune juice may be helpful.  You may use a stool softener,  such as Colace (over the counter) 100 mg twice a day.  Use MiraLax (over the counter) for constipation as needed.   Diet - low sodium heart healthy   Complete by: As directed    Increase activity slowly as tolerated   Complete by: As directed    Weight bearing as tolerated with assist device (walker, cane, etc) as directed, use it as long as suggested by your surgeon or therapist, typically at least 4-6 weeks.   Post-operative opioid taper instructions:   Complete by: As directed    POST-OPERATIVE OPIOID TAPER INSTRUCTIONS: It is important to wean off of your opioid medication as soon as possible. If you do not need pain medication after your surgery it is ok to stop day one. Opioids include: Codeine, Hydrocodone(Norco, Vicodin), Oxycodone(Percocet, oxycontin) and hydromorphone amongst others.  Long term and even short term use of opiods can cause: Increased pain response Dependence Constipation Depression Respiratory depression And more.  Withdrawal symptoms can include Flu like symptoms Nausea, vomiting And more Techniques to manage these symptoms Hydrate well Eat regular healthy meals Stay active Use relaxation techniques(deep breathing, meditating, yoga) Do Not substitute Alcohol to help with tapering If you have been on opioids for less than two weeks and do not have pain than it is ok to stop all together.  Plan to wean off of opioids This plan should start within one week post op of your joint replacement. Maintain the same interval or time between taking each dose and first decrease the dose.  Cut the total daily intake of opioids by one tablet each day Next start to increase the time between doses. The last dose that should be eliminated is the evening dose.      TED hose   Complete by: As directed    Use stockings (TED hose) for 2 weeks on both leg(s).  You may remove them at night for sleeping.        Follow-up Information     Cassandria Anger, PA-C. Go on  07/25/2023.   Specialty: Orthopedic Surgery Why: You are scheduled for follow up appt on Thursday November 7 at 4:00pm. Contact information: [address] [place] [address] [place] [address] [phone]                  Signed: Cassandria Anger 07/15/2023, 8:25 AM

## 2023-07-16 NOTE — Progress Notes (Signed)
Carelink Summary Report / Loop Recorder 

## 2023-08-05 ENCOUNTER — Ambulatory Visit (INDEPENDENT_AMBULATORY_CARE_PROVIDER_SITE_OTHER): Payer: Medicare Other

## 2023-08-05 DIAGNOSIS — I639 Cerebral infarction, unspecified: Secondary | ICD-10-CM

## 2023-08-05 DIAGNOSIS — H2513 Age-related nuclear cataract, bilateral: Secondary | ICD-10-CM | POA: Diagnosis not present

## 2023-08-05 DIAGNOSIS — H5213 Myopia, bilateral: Secondary | ICD-10-CM | POA: Diagnosis not present

## 2023-08-05 LAB — CUP PACEART REMOTE DEVICE CHECK
Date Time Interrogation Session: 20241117230830
Implantable Pulse Generator Implant Date: 20220120

## 2023-08-14 DIAGNOSIS — Z96642 Presence of left artificial hip joint: Secondary | ICD-10-CM | POA: Diagnosis not present

## 2023-08-14 DIAGNOSIS — M5416 Radiculopathy, lumbar region: Secondary | ICD-10-CM | POA: Diagnosis not present

## 2023-08-22 DIAGNOSIS — L821 Other seborrheic keratosis: Secondary | ICD-10-CM | POA: Diagnosis not present

## 2023-08-22 DIAGNOSIS — Z86018 Personal history of other benign neoplasm: Secondary | ICD-10-CM | POA: Diagnosis not present

## 2023-08-22 DIAGNOSIS — L578 Other skin changes due to chronic exposure to nonionizing radiation: Secondary | ICD-10-CM | POA: Diagnosis not present

## 2023-08-22 DIAGNOSIS — D1722 Benign lipomatous neoplasm of skin and subcutaneous tissue of left arm: Secondary | ICD-10-CM | POA: Diagnosis not present

## 2023-08-22 DIAGNOSIS — D0462 Carcinoma in situ of skin of left upper limb, including shoulder: Secondary | ICD-10-CM | POA: Diagnosis not present

## 2023-08-22 DIAGNOSIS — D225 Melanocytic nevi of trunk: Secondary | ICD-10-CM | POA: Diagnosis not present

## 2023-08-22 DIAGNOSIS — D485 Neoplasm of uncertain behavior of skin: Secondary | ICD-10-CM | POA: Diagnosis not present

## 2023-08-22 DIAGNOSIS — D3610 Benign neoplasm of peripheral nerves and autonomic nervous system, unspecified: Secondary | ICD-10-CM | POA: Diagnosis not present

## 2023-08-22 DIAGNOSIS — L570 Actinic keratosis: Secondary | ICD-10-CM | POA: Diagnosis not present

## 2023-08-22 DIAGNOSIS — Z85828 Personal history of other malignant neoplasm of skin: Secondary | ICD-10-CM | POA: Diagnosis not present

## 2023-08-27 DIAGNOSIS — K219 Gastro-esophageal reflux disease without esophagitis: Secondary | ICD-10-CM | POA: Diagnosis not present

## 2023-08-27 DIAGNOSIS — Z8673 Personal history of transient ischemic attack (TIA), and cerebral infarction without residual deficits: Secondary | ICD-10-CM | POA: Diagnosis not present

## 2023-08-27 DIAGNOSIS — E7800 Pure hypercholesterolemia, unspecified: Secondary | ICD-10-CM | POA: Diagnosis not present

## 2023-08-27 DIAGNOSIS — D81818 Other biotin-dependent carboxylase deficiency: Secondary | ICD-10-CM | POA: Diagnosis not present

## 2023-08-27 DIAGNOSIS — I10 Essential (primary) hypertension: Secondary | ICD-10-CM | POA: Diagnosis not present

## 2023-08-27 DIAGNOSIS — Z79899 Other long term (current) drug therapy: Secondary | ICD-10-CM | POA: Diagnosis not present

## 2023-08-28 DIAGNOSIS — Z5189 Encounter for other specified aftercare: Secondary | ICD-10-CM | POA: Diagnosis not present

## 2023-08-28 DIAGNOSIS — L239 Allergic contact dermatitis, unspecified cause: Secondary | ICD-10-CM | POA: Diagnosis not present

## 2023-08-29 NOTE — Progress Notes (Signed)
Carelink Summary Report / Loop Recorder 

## 2023-09-03 DIAGNOSIS — M5416 Radiculopathy, lumbar region: Secondary | ICD-10-CM | POA: Diagnosis not present

## 2023-09-09 ENCOUNTER — Ambulatory Visit: Payer: Medicare Other

## 2023-09-09 DIAGNOSIS — H43813 Vitreous degeneration, bilateral: Secondary | ICD-10-CM | POA: Diagnosis not present

## 2023-09-09 DIAGNOSIS — I639 Cerebral infarction, unspecified: Secondary | ICD-10-CM

## 2023-09-09 DIAGNOSIS — H25013 Cortical age-related cataract, bilateral: Secondary | ICD-10-CM | POA: Diagnosis not present

## 2023-09-09 DIAGNOSIS — H5213 Myopia, bilateral: Secondary | ICD-10-CM | POA: Diagnosis not present

## 2023-09-09 DIAGNOSIS — H2513 Age-related nuclear cataract, bilateral: Secondary | ICD-10-CM | POA: Diagnosis not present

## 2023-09-09 DIAGNOSIS — H52203 Unspecified astigmatism, bilateral: Secondary | ICD-10-CM | POA: Diagnosis not present

## 2023-09-09 DIAGNOSIS — H524 Presbyopia: Secondary | ICD-10-CM | POA: Diagnosis not present

## 2023-09-09 LAB — CUP PACEART REMOTE DEVICE CHECK
Date Time Interrogation Session: 20241222230529
Implantable Pulse Generator Implant Date: 20220120

## 2023-09-26 DIAGNOSIS — Z1231 Encounter for screening mammogram for malignant neoplasm of breast: Secondary | ICD-10-CM | POA: Diagnosis not present

## 2023-09-30 DIAGNOSIS — H2511 Age-related nuclear cataract, right eye: Secondary | ICD-10-CM | POA: Diagnosis not present

## 2023-09-30 DIAGNOSIS — H25811 Combined forms of age-related cataract, right eye: Secondary | ICD-10-CM | POA: Diagnosis not present

## 2023-09-30 DIAGNOSIS — H25011 Cortical age-related cataract, right eye: Secondary | ICD-10-CM | POA: Diagnosis not present

## 2023-09-30 DIAGNOSIS — Z961 Presence of intraocular lens: Secondary | ICD-10-CM | POA: Diagnosis not present

## 2023-10-01 DIAGNOSIS — D0462 Carcinoma in situ of skin of left upper limb, including shoulder: Secondary | ICD-10-CM | POA: Diagnosis not present

## 2023-10-14 ENCOUNTER — Ambulatory Visit (INDEPENDENT_AMBULATORY_CARE_PROVIDER_SITE_OTHER): Payer: Medicare Other

## 2023-10-14 DIAGNOSIS — I639 Cerebral infarction, unspecified: Secondary | ICD-10-CM | POA: Diagnosis not present

## 2023-10-14 LAB — CUP PACEART REMOTE DEVICE CHECK
Date Time Interrogation Session: 20250126230450
Implantable Pulse Generator Implant Date: 20220120

## 2023-10-15 ENCOUNTER — Encounter: Payer: Self-pay | Admitting: Cardiology

## 2023-10-16 DIAGNOSIS — M1611 Unilateral primary osteoarthritis, right hip: Secondary | ICD-10-CM | POA: Diagnosis not present

## 2023-10-17 NOTE — Progress Notes (Signed)
Carelink Summary Report / Loop Recorder

## 2023-10-21 DIAGNOSIS — H25012 Cortical age-related cataract, left eye: Secondary | ICD-10-CM | POA: Diagnosis not present

## 2023-10-21 DIAGNOSIS — H25812 Combined forms of age-related cataract, left eye: Secondary | ICD-10-CM | POA: Diagnosis not present

## 2023-10-21 DIAGNOSIS — H2512 Age-related nuclear cataract, left eye: Secondary | ICD-10-CM | POA: Diagnosis not present

## 2023-10-21 DIAGNOSIS — Z961 Presence of intraocular lens: Secondary | ICD-10-CM | POA: Diagnosis not present

## 2023-11-18 ENCOUNTER — Ambulatory Visit (INDEPENDENT_AMBULATORY_CARE_PROVIDER_SITE_OTHER): Payer: Medicare Other

## 2023-11-18 DIAGNOSIS — I639 Cerebral infarction, unspecified: Secondary | ICD-10-CM

## 2023-11-18 LAB — CUP PACEART REMOTE DEVICE CHECK
Date Time Interrogation Session: 20250302230254
Implantable Pulse Generator Implant Date: 20220120

## 2023-11-19 ENCOUNTER — Encounter: Payer: Self-pay | Admitting: Cardiology

## 2023-11-22 NOTE — Progress Notes (Signed)
 [facility]link Summary Report / Loop Recorder

## 2023-11-26 DIAGNOSIS — H02054 Trichiasis without entropian left upper eyelid: Secondary | ICD-10-CM | POA: Diagnosis not present

## 2023-11-26 DIAGNOSIS — T1512XA Foreign body in conjunctival sac, left eye, initial encounter: Secondary | ICD-10-CM | POA: Diagnosis not present

## 2023-12-19 NOTE — Progress Notes (Signed)
 [facility]link Summary Report / Loop Recorder

## 2023-12-20 DIAGNOSIS — R300 Dysuria: Secondary | ICD-10-CM | POA: Diagnosis not present

## 2023-12-20 DIAGNOSIS — N390 Urinary tract infection, site not specified: Secondary | ICD-10-CM | POA: Diagnosis not present

## 2023-12-23 ENCOUNTER — Ambulatory Visit (INDEPENDENT_AMBULATORY_CARE_PROVIDER_SITE_OTHER): Payer: Medicare Other

## 2023-12-23 DIAGNOSIS — I639 Cerebral infarction, unspecified: Secondary | ICD-10-CM

## 2023-12-23 LAB — CUP PACEART REMOTE DEVICE CHECK
Date Time Interrogation Session: 20250406230318
Implantable Pulse Generator Implant Date: 20220120

## 2023-12-25 DIAGNOSIS — M1611 Unilateral primary osteoarthritis, right hip: Secondary | ICD-10-CM | POA: Diagnosis not present

## 2023-12-28 ENCOUNTER — Encounter: Payer: Self-pay | Admitting: Cardiology

## 2024-01-27 ENCOUNTER — Ambulatory Visit (INDEPENDENT_AMBULATORY_CARE_PROVIDER_SITE_OTHER): Payer: Medicare Other

## 2024-01-27 DIAGNOSIS — I639 Cerebral infarction, unspecified: Secondary | ICD-10-CM | POA: Diagnosis not present

## 2024-01-27 LAB — CUP PACEART REMOTE DEVICE CHECK
Date Time Interrogation Session: 20250511233302
Implantable Pulse Generator Implant Date: 20220120

## 2024-01-30 DIAGNOSIS — M1712 Unilateral primary osteoarthritis, left knee: Secondary | ICD-10-CM | POA: Diagnosis not present

## 2024-02-02 ENCOUNTER — Ambulatory Visit: Payer: Self-pay | Admitting: Cardiology

## 2024-02-05 NOTE — Progress Notes (Signed)
 [facility]link Summary Report / Loop Recorder

## 2024-02-06 DIAGNOSIS — M1712 Unilateral primary osteoarthritis, left knee: Secondary | ICD-10-CM | POA: Diagnosis not present

## 2024-02-13 DIAGNOSIS — M1712 Unilateral primary osteoarthritis, left knee: Secondary | ICD-10-CM | POA: Diagnosis not present

## 2024-02-17 DIAGNOSIS — H6123 Impacted cerumen, bilateral: Secondary | ICD-10-CM | POA: Diagnosis not present

## 2024-02-22 DIAGNOSIS — N390 Urinary tract infection, site not specified: Secondary | ICD-10-CM | POA: Diagnosis not present

## 2024-02-22 DIAGNOSIS — R300 Dysuria: Secondary | ICD-10-CM | POA: Diagnosis not present

## 2024-02-22 DIAGNOSIS — R3915 Urgency of urination: Secondary | ICD-10-CM | POA: Diagnosis not present

## 2024-02-27 ENCOUNTER — Ambulatory Visit (INDEPENDENT_AMBULATORY_CARE_PROVIDER_SITE_OTHER)

## 2024-02-27 DIAGNOSIS — I639 Cerebral infarction, unspecified: Secondary | ICD-10-CM | POA: Diagnosis not present

## 2024-02-29 ENCOUNTER — Ambulatory Visit: Payer: Self-pay | Admitting: Cardiology

## 2024-03-02 ENCOUNTER — Ambulatory Visit: Payer: Medicare Other

## 2024-03-06 DIAGNOSIS — D81818 Other biotin-dependent carboxylase deficiency: Secondary | ICD-10-CM | POA: Diagnosis not present

## 2024-03-06 DIAGNOSIS — E559 Vitamin D deficiency, unspecified: Secondary | ICD-10-CM | POA: Diagnosis not present

## 2024-03-06 DIAGNOSIS — Z79899 Other long term (current) drug therapy: Secondary | ICD-10-CM | POA: Diagnosis not present

## 2024-03-06 DIAGNOSIS — E7800 Pure hypercholesterolemia, unspecified: Secondary | ICD-10-CM | POA: Diagnosis not present

## 2024-03-10 DIAGNOSIS — Z0001 Encounter for general adult medical examination with abnormal findings: Secondary | ICD-10-CM | POA: Diagnosis not present

## 2024-03-10 DIAGNOSIS — E7800 Pure hypercholesterolemia, unspecified: Secondary | ICD-10-CM | POA: Diagnosis not present

## 2024-03-10 DIAGNOSIS — Z79899 Other long term (current) drug therapy: Secondary | ICD-10-CM | POA: Diagnosis not present

## 2024-03-10 DIAGNOSIS — D81818 Other biotin-dependent carboxylase deficiency: Secondary | ICD-10-CM | POA: Diagnosis not present

## 2024-03-10 DIAGNOSIS — E559 Vitamin D deficiency, unspecified: Secondary | ICD-10-CM | POA: Diagnosis not present

## 2024-03-10 DIAGNOSIS — N1831 Chronic kidney disease, stage 3a: Secondary | ICD-10-CM | POA: Diagnosis not present

## 2024-03-10 DIAGNOSIS — Z8673 Personal history of transient ischemic attack (TIA), and cerebral infarction without residual deficits: Secondary | ICD-10-CM | POA: Diagnosis not present

## 2024-03-13 DIAGNOSIS — L821 Other seborrheic keratosis: Secondary | ICD-10-CM | POA: Diagnosis not present

## 2024-03-13 DIAGNOSIS — Z85828 Personal history of other malignant neoplasm of skin: Secondary | ICD-10-CM | POA: Diagnosis not present

## 2024-03-13 DIAGNOSIS — L570 Actinic keratosis: Secondary | ICD-10-CM | POA: Diagnosis not present

## 2024-03-13 DIAGNOSIS — D1722 Benign lipomatous neoplasm of skin and subcutaneous tissue of left arm: Secondary | ICD-10-CM | POA: Diagnosis not present

## 2024-03-13 DIAGNOSIS — D3610 Benign neoplasm of peripheral nerves and autonomic nervous system, unspecified: Secondary | ICD-10-CM | POA: Diagnosis not present

## 2024-03-13 DIAGNOSIS — D225 Melanocytic nevi of trunk: Secondary | ICD-10-CM | POA: Diagnosis not present

## 2024-03-13 DIAGNOSIS — L853 Xerosis cutis: Secondary | ICD-10-CM | POA: Diagnosis not present

## 2024-03-13 DIAGNOSIS — Z86018 Personal history of other benign neoplasm: Secondary | ICD-10-CM | POA: Diagnosis not present

## 2024-03-13 DIAGNOSIS — L578 Other skin changes due to chronic exposure to nonionizing radiation: Secondary | ICD-10-CM | POA: Diagnosis not present

## 2024-03-16 NOTE — Progress Notes (Signed)
 [facility]link Summary Report / Loop Recorder

## 2024-03-30 ENCOUNTER — Ambulatory Visit

## 2024-03-30 ENCOUNTER — Ambulatory Visit: Payer: Self-pay | Admitting: Cardiology

## 2024-03-30 DIAGNOSIS — N390 Urinary tract infection, site not specified: Secondary | ICD-10-CM | POA: Diagnosis not present

## 2024-03-30 DIAGNOSIS — I639 Cerebral infarction, unspecified: Secondary | ICD-10-CM | POA: Diagnosis not present

## 2024-03-30 DIAGNOSIS — R300 Dysuria: Secondary | ICD-10-CM | POA: Diagnosis not present

## 2024-03-30 LAB — CUP PACEART REMOTE DEVICE CHECK
Date Time Interrogation Session: 20250713233106
Implantable Pulse Generator Implant Date: 20220120

## 2024-04-06 ENCOUNTER — Ambulatory Visit: Payer: Medicare Other

## 2024-04-20 NOTE — Addendum Note (Signed)
 Addended by: VICCI SELLER A on: 04/20/2024 10:43 AM   Modules accepted: Orders

## 2024-04-20 NOTE — Progress Notes (Signed)
 [facility]link Summary Report / Loop Recorder

## 2024-04-30 ENCOUNTER — Ambulatory Visit (INDEPENDENT_AMBULATORY_CARE_PROVIDER_SITE_OTHER)

## 2024-04-30 DIAGNOSIS — I639 Cerebral infarction, unspecified: Secondary | ICD-10-CM

## 2024-04-30 LAB — CUP PACEART REMOTE DEVICE CHECK
Date Time Interrogation Session: 20250813230956
Implantable Pulse Generator Implant Date: 20220120

## 2024-05-01 ENCOUNTER — Ambulatory Visit: Payer: Self-pay | Admitting: Cardiology

## 2024-05-05 DIAGNOSIS — M25571 Pain in right ankle and joints of right foot: Secondary | ICD-10-CM | POA: Diagnosis not present

## 2024-05-05 DIAGNOSIS — M25561 Pain in right knee: Secondary | ICD-10-CM | POA: Diagnosis not present

## 2024-05-11 ENCOUNTER — Ambulatory Visit: Payer: Medicare Other

## 2024-05-28 DIAGNOSIS — N3021 Other chronic cystitis with hematuria: Secondary | ICD-10-CM | POA: Diagnosis not present

## 2024-05-28 DIAGNOSIS — R3915 Urgency of urination: Secondary | ICD-10-CM | POA: Diagnosis not present

## 2024-06-01 ENCOUNTER — Ambulatory Visit (INDEPENDENT_AMBULATORY_CARE_PROVIDER_SITE_OTHER)

## 2024-06-01 DIAGNOSIS — I639 Cerebral infarction, unspecified: Secondary | ICD-10-CM

## 2024-06-01 DIAGNOSIS — D6869 Other thrombophilia: Secondary | ICD-10-CM | POA: Diagnosis not present

## 2024-06-01 DIAGNOSIS — M47816 Spondylosis without myelopathy or radiculopathy, lumbar region: Secondary | ICD-10-CM | POA: Diagnosis not present

## 2024-06-01 DIAGNOSIS — Z8673 Personal history of transient ischemic attack (TIA), and cerebral infarction without residual deficits: Secondary | ICD-10-CM | POA: Diagnosis not present

## 2024-06-02 LAB — CUP PACEART REMOTE DEVICE CHECK
Date Time Interrogation Session: 20250914232008
Implantable Pulse Generator Implant Date: 20220120

## 2024-06-04 ENCOUNTER — Ambulatory Visit: Payer: Self-pay | Admitting: Cardiology

## 2024-06-08 NOTE — Progress Notes (Signed)
 Remote Loop Recorder Transmission

## 2024-06-11 NOTE — Progress Notes (Signed)
 Remote Loop Recorder Transmission

## 2024-06-12 DIAGNOSIS — Z23 Encounter for immunization: Secondary | ICD-10-CM | POA: Diagnosis not present

## 2024-06-15 ENCOUNTER — Ambulatory Visit: Payer: Medicare Other

## 2024-06-16 DIAGNOSIS — Z23 Encounter for immunization: Secondary | ICD-10-CM | POA: Diagnosis not present

## 2024-06-24 NOTE — Progress Notes (Signed)
 Remote Loop Recorder Transmission

## 2024-07-02 ENCOUNTER — Ambulatory Visit

## 2024-07-02 ENCOUNTER — Ambulatory Visit (INDEPENDENT_AMBULATORY_CARE_PROVIDER_SITE_OTHER)

## 2024-07-02 DIAGNOSIS — I639 Cerebral infarction, unspecified: Secondary | ICD-10-CM

## 2024-07-02 LAB — CUP PACEART REMOTE DEVICE CHECK
Date Time Interrogation Session: 20251015230941
Implantable Pulse Generator Implant Date: 20220120

## 2024-07-03 DIAGNOSIS — Z96642 Presence of left artificial hip joint: Secondary | ICD-10-CM | POA: Diagnosis not present

## 2024-07-03 DIAGNOSIS — S82831A Other fracture of upper and lower end of right fibula, initial encounter for closed fracture: Secondary | ICD-10-CM | POA: Diagnosis not present

## 2024-07-03 DIAGNOSIS — M25571 Pain in right ankle and joints of right foot: Secondary | ICD-10-CM | POA: Diagnosis not present

## 2024-07-06 ENCOUNTER — Ambulatory Visit: Payer: Self-pay | Admitting: Cardiology

## 2024-07-08 NOTE — Progress Notes (Signed)
 Remote Loop Recorder Transmission

## 2024-07-21 DIAGNOSIS — N3021 Other chronic cystitis with hematuria: Secondary | ICD-10-CM | POA: Diagnosis not present

## 2024-08-02 ENCOUNTER — Ambulatory Visit

## 2024-08-03 ENCOUNTER — Ambulatory Visit

## 2024-08-04 ENCOUNTER — Ambulatory Visit: Attending: Cardiology

## 2024-08-04 DIAGNOSIS — I639 Cerebral infarction, unspecified: Secondary | ICD-10-CM

## 2024-08-05 ENCOUNTER — Ambulatory Visit: Payer: Self-pay | Admitting: Cardiology

## 2024-08-05 LAB — CUP PACEART REMOTE DEVICE CHECK
Date Time Interrogation Session: 20251117230931
Implantable Pulse Generator Implant Date: 20220120

## 2024-08-07 NOTE — Progress Notes (Signed)
 Remote Loop Recorder Transmission

## 2024-08-17 ENCOUNTER — Telehealth: Payer: Self-pay

## 2024-08-17 NOTE — Telephone Encounter (Signed)
   Pre-operative Risk Assessment    Patient Name: Gwendolyn Morrow  DOB: 04/02/1941 MRN: [id number]   Date of last office visit: 11/30/20  Dr. Kelsie Date of next office visit: NA    Request for Surgical Clearance    Procedure:  Epidural steroid injection, lumbar PROC - L4-5  Date of Surgery:  Clearance TBD                              Surgeon:  Dr. Bonner Surgeon's Group or Practice Name:  [facility] Phone number:  [phone] Fax number:  [phone]   Type of Clearance Requested:   - Medical  - Pharmacy:  Hold Clopidogrel  (Plavix ) 7 days prior to   Type of Anesthesia:  Not Indicated   Additional requests/questions:    [name]   08/17/2024, 4:42 PM

## 2024-08-18 DIAGNOSIS — M25551 Pain in right hip: Secondary | ICD-10-CM | POA: Diagnosis not present

## 2024-08-18 NOTE — Telephone Encounter (Signed)
 Will send a message to EP schedulers to reach out to the pt to schedule an appt with EP APP for preop clearance.

## 2024-08-18 NOTE — Telephone Encounter (Signed)
   Name: Gwendolyn Morrow  DOB: 1941/05/13  MRN: [id number]  Primary Cardiologist: None  Chart reviewed as part of pre-operative protocol coverage. Because of [name]'s past medical history and time since last visit, she will require a follow-up in-office visit in order to better assess preoperative cardiovascular risk.  Patient was last seen by Dr. Kelsie in 2022.  Patient will need to establish with a new cardiologist.  Pre-op covering staff: - Please schedule appointment and call patient to inform them. If patient already had an upcoming appointment within acceptable timeframe, please add pre-op clearance to the appointment notes so provider is aware. - Please contact requesting surgeon's office via preferred method (i.e, phone, fax) to inform them of need for appointment prior to surgery.  Patient takes Plavix  for history of stroke.  This is not managed by cardiology.  Recommendations for holding Plavix  prior to procedure should come from managing provider (PCP vs neurology).  Damien JAYSON Braver, NP  08/18/2024, 8:51 AM \

## 2024-08-19 NOTE — Telephone Encounter (Signed)
 I will update the preop APP as to the note from the pt today with the EP scheduler. Will confirm if pt will still need new pt appt or not with EP.

## 2024-08-19 NOTE — Telephone Encounter (Signed)
  Spoke with the patient. She stated that the request should have been sent to her PCP. She will call Dr. Lela office to resend her pre-op clearance request to Dr. Regino.

## 2024-08-20 DIAGNOSIS — M1712 Unilateral primary osteoarthritis, left knee: Secondary | ICD-10-CM | POA: Diagnosis not present

## 2024-08-20 NOTE — Telephone Encounter (Signed)
 I will send notes to requesting office to see notes from call with the pt and the preop APP.

## 2024-08-27 DIAGNOSIS — M1712 Unilateral primary osteoarthritis, left knee: Secondary | ICD-10-CM | POA: Diagnosis not present

## 2024-09-01 DIAGNOSIS — M5416 Radiculopathy, lumbar region: Secondary | ICD-10-CM | POA: Diagnosis not present

## 2024-09-02 ENCOUNTER — Ambulatory Visit

## 2024-09-04 ENCOUNTER — Ambulatory Visit: Payer: Self-pay | Admitting: Cardiology

## 2024-09-04 ENCOUNTER — Ambulatory Visit

## 2024-09-04 DIAGNOSIS — I639 Cerebral infarction, unspecified: Secondary | ICD-10-CM

## 2024-09-04 LAB — CUP PACEART REMOTE DEVICE CHECK
Date Time Interrogation Session: 20251218231118
Implantable Pulse Generator Implant Date: 20220120

## 2024-09-07 NOTE — Progress Notes (Signed)
 Remote Loop Recorder Transmission

## 2024-10-03 ENCOUNTER — Ambulatory Visit

## 2024-10-05 ENCOUNTER — Ambulatory Visit

## 2024-10-05 DIAGNOSIS — I639 Cerebral infarction, unspecified: Secondary | ICD-10-CM

## 2024-10-06 LAB — CUP PACEART REMOTE DEVICE CHECK
Date Time Interrogation Session: 20260118230246
Implantable Pulse Generator Implant Date: 20220120

## 2024-10-09 NOTE — Progress Notes (Signed)
 Remote Loop Recorder Transmission

## 2024-10-11 ENCOUNTER — Ambulatory Visit: Payer: Self-pay | Admitting: Cardiology

## 2024-11-03 ENCOUNTER — Ambulatory Visit

## 2024-11-05 ENCOUNTER — Ambulatory Visit

## 2024-12-06 ENCOUNTER — Ambulatory Visit

## 2025-01-06 ENCOUNTER — Ambulatory Visit

## 2025-02-06 ENCOUNTER — Ambulatory Visit

## 2025-03-09 ENCOUNTER — Ambulatory Visit

## 2025-04-09 ENCOUNTER — Ambulatory Visit

## 2025-05-10 ENCOUNTER — Ambulatory Visit

## 2025-06-10 ENCOUNTER — Ambulatory Visit

## 2025-07-11 ENCOUNTER — Ambulatory Visit

## 2025-08-11 ENCOUNTER — Ambulatory Visit

## 2025-09-11 ENCOUNTER — Ambulatory Visit

## 2025-10-12 ENCOUNTER — Ambulatory Visit
